# Patient Record
Sex: Female | Born: 1965 | Race: White | Hispanic: No | Smoking: Current every day smoker
Health system: Southern US, Community
[De-identification: ages and names within clinical notes are randomized; demographics above are authoritative.]

## PROBLEM LIST (undated history)

## (undated) DIAGNOSIS — D509 Iron deficiency anemia, unspecified: Secondary | ICD-10-CM

## (undated) DIAGNOSIS — T7840XA Allergy, unspecified, initial encounter: Secondary | ICD-10-CM

## (undated) DIAGNOSIS — Q282 Arteriovenous malformation of cerebral vessels: Secondary | ICD-10-CM

## (undated) DIAGNOSIS — R202 Paresthesia of skin: Secondary | ICD-10-CM

## (undated) DIAGNOSIS — G40909 Epilepsy, unspecified, not intractable, without status epilepticus: Secondary | ICD-10-CM

## (undated) DIAGNOSIS — G43909 Migraine, unspecified, not intractable, without status migrainosus: Secondary | ICD-10-CM

## (undated) DIAGNOSIS — R200 Anesthesia of skin: Secondary | ICD-10-CM

## (undated) DIAGNOSIS — G510 Bell's palsy: Secondary | ICD-10-CM

## (undated) DIAGNOSIS — M797 Fibromyalgia: Secondary | ICD-10-CM

## (undated) DIAGNOSIS — M5430 Sciatica, unspecified side: Secondary | ICD-10-CM

## (undated) DIAGNOSIS — R413 Other amnesia: Secondary | ICD-10-CM

## (undated) DIAGNOSIS — E041 Nontoxic single thyroid nodule: Secondary | ICD-10-CM

## (undated) DIAGNOSIS — Z780 Asymptomatic menopausal state: Secondary | ICD-10-CM

## (undated) HISTORY — DX: Fibromyalgia: M79.7

## (undated) HISTORY — DX: Asymptomatic menopausal state: Z78.0

## (undated) HISTORY — DX: Sciatica, unspecified side: M54.30

## (undated) HISTORY — DX: Anesthesia of skin: R20.0

## (undated) HISTORY — DX: Migraine, unspecified, not intractable, without status migrainosus: G43.909

## (undated) HISTORY — DX: Arteriovenous malformation of cerebral vessels: Q28.2

## (undated) HISTORY — DX: Iron deficiency anemia, unspecified: D50.9

## (undated) HISTORY — DX: Anesthesia of skin: R20.2

## (undated) HISTORY — DX: Nontoxic single thyroid nodule: E04.1

## (undated) HISTORY — DX: Bell's palsy: G51.0

## (undated) HISTORY — DX: Allergy, unspecified, initial encounter: T78.40XA

---

## 1996-01-25 HISTORY — PX: TUBAL LIGATION: SHX77

## 2002-01-24 HISTORY — PX: ABDOMINAL HYSTERECTOMY: SHX81

## 2003-01-25 HISTORY — PX: CRANIOTOMY: SHX93

## 2003-07-09 DIAGNOSIS — Q282 Arteriovenous malformation of cerebral vessels: Secondary | ICD-10-CM

## 2003-07-09 HISTORY — DX: Arteriovenous malformation of cerebral vessels: Q28.2

## 2003-08-13 ENCOUNTER — Ambulatory Visit (HOSPITAL_COMMUNITY): Admission: RE | Admit: 2003-08-13 | Discharge: 2003-08-13 | Payer: Self-pay | Admitting: Neurological Surgery

## 2004-01-05 ENCOUNTER — Ambulatory Visit: Payer: Self-pay | Admitting: Family Medicine

## 2004-01-08 ENCOUNTER — Ambulatory Visit: Payer: Self-pay | Admitting: Family Medicine

## 2004-10-05 ENCOUNTER — Ambulatory Visit: Payer: Self-pay | Admitting: Family Medicine

## 2005-02-03 ENCOUNTER — Encounter: Payer: Self-pay | Admitting: Family Medicine

## 2005-02-24 ENCOUNTER — Encounter: Payer: Self-pay | Admitting: Family Medicine

## 2005-03-24 ENCOUNTER — Encounter: Payer: Self-pay | Admitting: Family Medicine

## 2005-04-24 ENCOUNTER — Encounter: Payer: Self-pay | Admitting: Family Medicine

## 2005-12-08 ENCOUNTER — Ambulatory Visit: Payer: Self-pay | Admitting: Family Medicine

## 2006-03-19 ENCOUNTER — Emergency Department: Payer: Self-pay | Admitting: General Practice

## 2006-12-06 ENCOUNTER — Ambulatory Visit: Payer: Self-pay | Admitting: Family Medicine

## 2007-01-30 ENCOUNTER — Ambulatory Visit: Payer: Self-pay | Admitting: Family Medicine

## 2007-09-13 ENCOUNTER — Ambulatory Visit: Payer: Self-pay | Admitting: Family Medicine

## 2008-09-02 DIAGNOSIS — D509 Iron deficiency anemia, unspecified: Secondary | ICD-10-CM

## 2008-09-02 DIAGNOSIS — F321 Major depressive disorder, single episode, moderate: Secondary | ICD-10-CM | POA: Insufficient documentation

## 2008-09-02 HISTORY — DX: Iron deficiency anemia, unspecified: D50.9

## 2008-10-03 ENCOUNTER — Ambulatory Visit: Payer: Self-pay | Admitting: Family Medicine

## 2010-10-27 ENCOUNTER — Ambulatory Visit: Payer: Self-pay | Admitting: Family Medicine

## 2010-10-27 DIAGNOSIS — E041 Nontoxic single thyroid nodule: Secondary | ICD-10-CM

## 2010-10-27 HISTORY — DX: Nontoxic single thyroid nodule: E04.1

## 2011-01-28 DIAGNOSIS — IMO0001 Reserved for inherently not codable concepts without codable children: Secondary | ICD-10-CM | POA: Diagnosis not present

## 2011-01-28 DIAGNOSIS — Z23 Encounter for immunization: Secondary | ICD-10-CM | POA: Diagnosis not present

## 2011-03-22 DIAGNOSIS — F39 Unspecified mood [affective] disorder: Secondary | ICD-10-CM | POA: Diagnosis not present

## 2011-05-25 DIAGNOSIS — Z23 Encounter for immunization: Secondary | ICD-10-CM | POA: Diagnosis not present

## 2011-05-25 DIAGNOSIS — F329 Major depressive disorder, single episode, unspecified: Secondary | ICD-10-CM | POA: Diagnosis not present

## 2011-05-25 DIAGNOSIS — Z13220 Encounter for screening for lipoid disorders: Secondary | ICD-10-CM | POA: Diagnosis not present

## 2011-08-23 ENCOUNTER — Ambulatory Visit: Payer: Self-pay | Admitting: Family Medicine

## 2011-08-23 DIAGNOSIS — Z23 Encounter for immunization: Secondary | ICD-10-CM | POA: Diagnosis not present

## 2011-08-23 DIAGNOSIS — S6990XA Unspecified injury of unspecified wrist, hand and finger(s), initial encounter: Secondary | ICD-10-CM | POA: Diagnosis not present

## 2011-08-23 DIAGNOSIS — M79609 Pain in unspecified limb: Secondary | ICD-10-CM | POA: Diagnosis not present

## 2011-08-23 DIAGNOSIS — M25539 Pain in unspecified wrist: Secondary | ICD-10-CM | POA: Diagnosis not present

## 2011-08-23 DIAGNOSIS — M25529 Pain in unspecified elbow: Secondary | ICD-10-CM | POA: Diagnosis not present

## 2011-08-23 DIAGNOSIS — S59919A Unspecified injury of unspecified forearm, initial encounter: Secondary | ICD-10-CM | POA: Diagnosis not present

## 2011-08-23 DIAGNOSIS — S59909A Unspecified injury of unspecified elbow, initial encounter: Secondary | ICD-10-CM | POA: Diagnosis not present

## 2011-08-23 DIAGNOSIS — F329 Major depressive disorder, single episode, unspecified: Secondary | ICD-10-CM | POA: Diagnosis not present

## 2011-09-06 DIAGNOSIS — F329 Major depressive disorder, single episode, unspecified: Secondary | ICD-10-CM | POA: Diagnosis not present

## 2011-09-06 DIAGNOSIS — J0100 Acute maxillary sinusitis, unspecified: Secondary | ICD-10-CM | POA: Diagnosis not present

## 2011-09-06 DIAGNOSIS — Z23 Encounter for immunization: Secondary | ICD-10-CM | POA: Diagnosis not present

## 2011-10-31 DIAGNOSIS — F39 Unspecified mood [affective] disorder: Secondary | ICD-10-CM | POA: Diagnosis not present

## 2011-11-16 DIAGNOSIS — Z23 Encounter for immunization: Secondary | ICD-10-CM | POA: Diagnosis not present

## 2011-11-16 DIAGNOSIS — IMO0001 Reserved for inherently not codable concepts without codable children: Secondary | ICD-10-CM | POA: Diagnosis not present

## 2011-11-16 DIAGNOSIS — F329 Major depressive disorder, single episode, unspecified: Secondary | ICD-10-CM | POA: Diagnosis not present

## 2012-03-13 DIAGNOSIS — F329 Major depressive disorder, single episode, unspecified: Secondary | ICD-10-CM | POA: Diagnosis not present

## 2012-03-13 DIAGNOSIS — R42 Dizziness and giddiness: Secondary | ICD-10-CM | POA: Diagnosis not present

## 2012-03-13 DIAGNOSIS — Z23 Encounter for immunization: Secondary | ICD-10-CM | POA: Diagnosis not present

## 2012-04-30 DIAGNOSIS — F39 Unspecified mood [affective] disorder: Secondary | ICD-10-CM | POA: Diagnosis not present

## 2012-09-18 ENCOUNTER — Observation Stay: Payer: Self-pay | Admitting: Internal Medicine

## 2012-09-18 DIAGNOSIS — Z6841 Body Mass Index (BMI) 40.0 and over, adult: Secondary | ICD-10-CM | POA: Diagnosis not present

## 2012-09-18 DIAGNOSIS — R072 Precordial pain: Secondary | ICD-10-CM | POA: Diagnosis not present

## 2012-09-18 DIAGNOSIS — R52 Pain, unspecified: Secondary | ICD-10-CM | POA: Diagnosis not present

## 2012-09-18 DIAGNOSIS — Z79899 Other long term (current) drug therapy: Secondary | ICD-10-CM | POA: Diagnosis not present

## 2012-09-18 DIAGNOSIS — I209 Angina pectoris, unspecified: Secondary | ICD-10-CM | POA: Diagnosis not present

## 2012-09-18 DIAGNOSIS — E669 Obesity, unspecified: Secondary | ICD-10-CM | POA: Diagnosis not present

## 2012-09-18 DIAGNOSIS — G40909 Epilepsy, unspecified, not intractable, without status epilepticus: Secondary | ICD-10-CM | POA: Diagnosis not present

## 2012-09-18 DIAGNOSIS — R0602 Shortness of breath: Secondary | ICD-10-CM | POA: Diagnosis not present

## 2012-09-18 DIAGNOSIS — Z8541 Personal history of malignant neoplasm of cervix uteri: Secondary | ICD-10-CM | POA: Diagnosis not present

## 2012-09-18 DIAGNOSIS — R079 Chest pain, unspecified: Secondary | ICD-10-CM | POA: Diagnosis not present

## 2012-09-18 LAB — CBC
HCT: 36.9 % (ref 35.0–47.0)
HGB: 12.9 g/dL (ref 12.0–16.0)
MCH: 31 pg (ref 26.0–34.0)
MCHC: 34.9 g/dL (ref 32.0–36.0)
Platelet: 275 10*3/uL (ref 150–440)

## 2012-09-18 LAB — LIPID PANEL
CHOLESTEROL: 190 mg/dL (ref 0–200)
Cholesterol: 190 mg/dL
HDL Cholesterol: 61 mg/dL — ABNORMAL HIGH
HDL: 61 mg/dL (ref 35–70)
LDL CALC: 97 mg/dL
Ldl Cholesterol, Calc: 97 mg/dL
TRIGLYCERIDES: 160 mg/dL (ref 40–160)
Triglycerides: 160 mg/dL
VLDL Cholesterol, Calc: 32 mg/dL

## 2012-09-18 LAB — BASIC METABOLIC PANEL
Calcium, Total: 9.5 mg/dL (ref 8.5–10.1)
Chloride: 104 mmol/L (ref 98–107)
Co2: 25 mmol/L (ref 21–32)
EGFR (African American): >60
Sodium: 137 mmol/L (ref 136–145)

## 2012-09-18 LAB — TROPONIN I: Troponin-I: <0.02 ng/mL

## 2012-09-18 LAB — CK TOTAL AND CKMB (NOT AT ARMC): CK-MB: 1.2 ng/mL (ref 0.5–3.6)

## 2012-09-19 DIAGNOSIS — E669 Obesity, unspecified: Secondary | ICD-10-CM | POA: Diagnosis not present

## 2012-09-19 DIAGNOSIS — R079 Chest pain, unspecified: Secondary | ICD-10-CM | POA: Diagnosis not present

## 2012-09-19 DIAGNOSIS — R0609 Other forms of dyspnea: Secondary | ICD-10-CM | POA: Diagnosis not present

## 2012-09-19 DIAGNOSIS — I209 Angina pectoris, unspecified: Secondary | ICD-10-CM | POA: Diagnosis not present

## 2012-09-19 DIAGNOSIS — G40909 Epilepsy, unspecified, not intractable, without status epilepticus: Secondary | ICD-10-CM | POA: Diagnosis not present

## 2012-09-19 LAB — CK TOTAL AND CKMB (NOT AT ARMC)
CK, Total: 154 U/L (ref 21–215)
CK-MB: 1.2 ng/mL (ref 0.5–3.6)

## 2012-09-19 LAB — TROPONIN I: Troponin-I: <0.02 ng/mL

## 2012-09-21 DIAGNOSIS — R079 Chest pain, unspecified: Secondary | ICD-10-CM | POA: Diagnosis not present

## 2012-09-21 DIAGNOSIS — Z23 Encounter for immunization: Secondary | ICD-10-CM | POA: Diagnosis not present

## 2012-09-21 DIAGNOSIS — K219 Gastro-esophageal reflux disease without esophagitis: Secondary | ICD-10-CM | POA: Diagnosis not present

## 2012-10-16 DIAGNOSIS — K219 Gastro-esophageal reflux disease without esophagitis: Secondary | ICD-10-CM | POA: Diagnosis not present

## 2012-10-16 DIAGNOSIS — Z23 Encounter for immunization: Secondary | ICD-10-CM | POA: Diagnosis not present

## 2012-10-16 DIAGNOSIS — R1310 Dysphagia, unspecified: Secondary | ICD-10-CM | POA: Diagnosis not present

## 2012-10-24 ENCOUNTER — Ambulatory Visit: Payer: Self-pay | Admitting: Family Medicine

## 2012-10-24 DIAGNOSIS — R1310 Dysphagia, unspecified: Secondary | ICD-10-CM | POA: Diagnosis not present

## 2012-10-29 DIAGNOSIS — Z79899 Other long term (current) drug therapy: Secondary | ICD-10-CM | POA: Diagnosis not present

## 2012-10-29 DIAGNOSIS — F39 Unspecified mood [affective] disorder: Secondary | ICD-10-CM | POA: Diagnosis not present

## 2012-10-29 DIAGNOSIS — Z1339 Encounter for screening examination for other mental health and behavioral disorders: Secondary | ICD-10-CM | POA: Diagnosis not present

## 2012-12-18 DIAGNOSIS — R1310 Dysphagia, unspecified: Secondary | ICD-10-CM | POA: Diagnosis not present

## 2012-12-18 DIAGNOSIS — R1032 Left lower quadrant pain: Secondary | ICD-10-CM | POA: Diagnosis not present

## 2012-12-18 DIAGNOSIS — R1031 Right lower quadrant pain: Secondary | ICD-10-CM | POA: Diagnosis not present

## 2013-01-24 DIAGNOSIS — Z780 Asymptomatic menopausal state: Secondary | ICD-10-CM

## 2013-01-24 HISTORY — DX: Asymptomatic menopausal state: Z78.0

## 2013-02-20 DIAGNOSIS — R209 Unspecified disturbances of skin sensation: Secondary | ICD-10-CM | POA: Diagnosis not present

## 2013-02-20 DIAGNOSIS — Z23 Encounter for immunization: Secondary | ICD-10-CM | POA: Diagnosis not present

## 2013-02-20 DIAGNOSIS — G40909 Epilepsy, unspecified, not intractable, without status epilepticus: Secondary | ICD-10-CM | POA: Diagnosis not present

## 2013-02-20 LAB — CBC AND DIFFERENTIAL
HCT: 37 % (ref 36–46)
Hemoglobin: 12.6 g/dL (ref 12.0–16.0)
Platelets: 314 10*3/uL (ref 150–399)
WBC: 7.6 10*3/mL

## 2013-02-21 ENCOUNTER — Ambulatory Visit: Payer: Self-pay | Admitting: Family Medicine

## 2013-02-21 DIAGNOSIS — R209 Unspecified disturbances of skin sensation: Secondary | ICD-10-CM | POA: Diagnosis not present

## 2013-02-21 DIAGNOSIS — G9389 Other specified disorders of brain: Secondary | ICD-10-CM | POA: Diagnosis not present

## 2013-02-21 DIAGNOSIS — R51 Headache: Secondary | ICD-10-CM | POA: Diagnosis not present

## 2013-02-21 DIAGNOSIS — H538 Other visual disturbances: Secondary | ICD-10-CM | POA: Diagnosis not present

## 2013-03-15 DIAGNOSIS — Z23 Encounter for immunization: Secondary | ICD-10-CM | POA: Diagnosis not present

## 2013-03-15 DIAGNOSIS — G40909 Epilepsy, unspecified, not intractable, without status epilepticus: Secondary | ICD-10-CM | POA: Diagnosis not present

## 2013-03-15 DIAGNOSIS — R42 Dizziness and giddiness: Secondary | ICD-10-CM | POA: Diagnosis not present

## 2013-03-15 DIAGNOSIS — J019 Acute sinusitis, unspecified: Secondary | ICD-10-CM | POA: Diagnosis not present

## 2013-03-15 DIAGNOSIS — E041 Nontoxic single thyroid nodule: Secondary | ICD-10-CM | POA: Diagnosis not present

## 2013-03-15 LAB — TSH: TSH: 1.68 u[IU]/mL (ref <=5.90)

## 2013-03-15 LAB — HEPATIC FUNCTION PANEL
ALT: 18 U/L (ref 7–35)
AST: 14 U/L (ref 13–35)

## 2013-03-15 LAB — BASIC METABOLIC PANEL
BUN: 18 mg/dL (ref 4–21)
CREATININE: 0.7 mg/dL (ref <=1.1)
GLUCOSE: 87 mg/dL
Potassium: 4.4 mmol/L (ref 3.4–5.3)
Sodium: 141 mmol/L (ref 137–147)

## 2013-03-18 DIAGNOSIS — F39 Unspecified mood [affective] disorder: Secondary | ICD-10-CM | POA: Diagnosis not present

## 2013-04-17 DIAGNOSIS — G40909 Epilepsy, unspecified, not intractable, without status epilepticus: Secondary | ICD-10-CM | POA: Diagnosis not present

## 2013-04-17 DIAGNOSIS — H6500 Acute serous otitis media, unspecified ear: Secondary | ICD-10-CM | POA: Diagnosis not present

## 2013-04-17 DIAGNOSIS — Z23 Encounter for immunization: Secondary | ICD-10-CM | POA: Diagnosis not present

## 2013-04-17 DIAGNOSIS — J309 Allergic rhinitis, unspecified: Secondary | ICD-10-CM | POA: Diagnosis not present

## 2013-07-01 DIAGNOSIS — G40909 Epilepsy, unspecified, not intractable, without status epilepticus: Secondary | ICD-10-CM | POA: Diagnosis not present

## 2013-07-01 DIAGNOSIS — Z23 Encounter for immunization: Secondary | ICD-10-CM | POA: Diagnosis not present

## 2013-07-01 DIAGNOSIS — M25569 Pain in unspecified knee: Secondary | ICD-10-CM | POA: Diagnosis not present

## 2013-07-01 DIAGNOSIS — M25559 Pain in unspecified hip: Secondary | ICD-10-CM | POA: Diagnosis not present

## 2013-09-19 DIAGNOSIS — F3289 Other specified depressive episodes: Secondary | ICD-10-CM | POA: Diagnosis not present

## 2013-09-19 DIAGNOSIS — N951 Menopausal and female climacteric states: Secondary | ICD-10-CM | POA: Diagnosis not present

## 2013-09-19 DIAGNOSIS — IMO0001 Reserved for inherently not codable concepts without codable children: Secondary | ICD-10-CM | POA: Diagnosis not present

## 2013-09-19 DIAGNOSIS — F329 Major depressive disorder, single episode, unspecified: Secondary | ICD-10-CM | POA: Diagnosis not present

## 2013-09-19 DIAGNOSIS — G40909 Epilepsy, unspecified, not intractable, without status epilepticus: Secondary | ICD-10-CM | POA: Diagnosis not present

## 2014-01-01 DIAGNOSIS — F329 Major depressive disorder, single episode, unspecified: Secondary | ICD-10-CM | POA: Diagnosis not present

## 2014-01-01 DIAGNOSIS — J309 Allergic rhinitis, unspecified: Secondary | ICD-10-CM | POA: Diagnosis not present

## 2014-01-01 DIAGNOSIS — J069 Acute upper respiratory infection, unspecified: Secondary | ICD-10-CM | POA: Diagnosis not present

## 2014-01-01 DIAGNOSIS — M797 Fibromyalgia: Secondary | ICD-10-CM | POA: Diagnosis not present

## 2014-05-16 NOTE — H&P (Signed)
PATIENT NAME:  Sharon Bryan, Sharon Bryan MR#:  [id number] DATE OF BIRTH:  [birth year]  DATE OF ADMISSION:  09/18/2012  PRIMARY CARE PHYSICIAN:  Lelon Huh, M.D.   REFERRING PHYSICIAN:  Delene Loll, M.D.  CHIEF COMPLAINT: Chest pain.   HISTORY OF PRESENT ILLNESS:  The patient is a 49 year old morbidly obese female with a history of brain surgery for AVM, history of seizures, currently not on any seizure medications, presented to the Emergency Department with complaints of chest pain that started while at rest. The pain started in the left parasternal area, sharp in nature, radiating to the back, 10 x 10 intensity, associated with shortness of breath. Denied having any nausea, vomiting, diaphoresis, palpitations. The patient has been experiencing this pain on and off lasting about 10 to 15 minutes; however, less intense. The patient took aspirin after the initial event with improvement of the pain. The patient denies having any previous episodes of chest pain. Denies lifting any heavy weights, denies any recent travel, denies having any cough productive of sputum.   PAST MEDICAL HISTORY: 1.  History of seizures.  2.  Cervical cancer.  3.  AVMs of the brain.  4.  Brain surgery.  5.  Partial hysterectomy.   ALLERGIES: CODEINE.    HOME MEDICATIONS: 1.  Paroxetine 20 mg once a day.  2.  Lyrica 150 mg 2 times a day.  3.  Lamotrigine 200 mg once a day. 4.  Diclofenac 50 mg 2 times a day.  5.  30 mg 2 times a day.  6.  Amitriptyline 10 mg 1 to 3 tablets once a day.  7.  Alprazolam 1 tablet at bedtime.   SOCIAL HISTORY: No history of smoking, drinking alcohol or using illicit drugs. Lives with her fiance.   FAMILY HISTORY: Strong family history of coronary artery disease.   REVIEW OF SYSTEMS: CONSTITUTIONAL: No generalized weakness.   EYES: No change in vision.  ENT: No change in hearing. No sore throat. RESPIRATORY: Has mild shortness of breath secondary to chest pain.  CARDIOVASCULAR: No  palpitations. No pedal edema, has chest pain.  GASTROINTESTINAL: No nausea or vomiting.  GENITOURINARY: No dysuria or hematuria.  SKIN: No rash or lesions.  HEMATOLOGIC: No easy bruising or bleeding.   ENDOCRINE: No polyuria or polydipsia.  MUSCULOSKELETAL:  Has chronic back pain.  NEUROLOGIC: No weakness or numbness in any part of the body. History of seizures.   PHYSICAL EXAMINATION: GENERAL: This is a well-built, well-nourished, obese female, lying down in the bed, not in distress.  VITAL SIGNS: Temperature 97.8, pulse 76, blood pressure 132/92, respiratory rate of 16, oxygen saturation is 98% on room air.  HEENT: Head normocephalic, atraumatic. Eyes: No scleral icterus, conjunctivae normal. Pupils equal and react to light. Extraocular movements are intact. Mucous membranes moist. No pallor.  NECK: Supple. No lymphadenopathy. No JVD. No carotid bruit. No thyromegaly.  CHEST: Has palpable tenderness on the left parasternal area. LUNGS:  Bilaterally clear to auscultation.  HEART: S1,S2 regular. No murmurs are heard.  ABDOMEN: Bowel sounds present. Soft, nontender, nondistended, obese abdomen. Could not appreciate any hepatosplenomegaly.  EXTREMITIES: No pedal edema. Pulses 2+.  SKIN: No rash or lesions.  MUSCULOSKELETAL: No joint pains and aches. NEUROLOGIC: The patient is alert, oriented to place, person and time. Cranial nerves II through XII intact. No motor and sensory deficits.   EKG, 12-lead: T wave inversions in V2, V3 V4.   LABORATORY DATA: D-dimer is less than 0.22. Troponin is negative. CBC, BMP are completely  within normal limits.   Chest x-ray, one view portable: No acute cardiopulmonary disease.   ASSESSMENT AND PLAN: The patient is a 49 year old obese female who comes to the Emergency Department with complaints of chest pain.   1.  Chest pain:  This is a musculoskeletal pain; however, the patient's risk factors are obesity,  family history. Admit the patient to monitored  bed, cycle cardiac enzymes times x 3. If negative, the patient can have an exercise stress echo prior to discharge.  2.  Obesity. Counseled with the patient regarding diet and exercise.  3.  Keep the patient on deep vein thrombosis prophylaxis with Lovenox.   TIME SPENT: 40 minutes.     ____________________________ Monica Becton, MD pv:nts D: 09/19/2012 04:25:00 ET T: 09/19/2012 05:53:37 ET JOB#: [id number]  cc: Kirstie Peri. Caryn Section, MD Monica Becton, MD, <Dictator>   Grier Mitts Tomasz Steeves MD ELECTRONICALLY SIGNED 09/26/2012 22:08

## 2014-05-16 NOTE — Discharge Summary (Signed)
PATIENT NAME:  Sharon Bryan, Sharon Bryan MR#:  [id number] DATE OF BIRTH:  August 27, 1965  DATE OF ADMISSION:  09/18/2012  DATE OF DISCHARGE:  09/19/2012  ADMITTING DIAGNOSIS:  Chest pain.   DISCHARGE DIAGNOSES:  1.  Chest pain of unclear etiology at this time, suspected musculoskeletal, with negative cardiac enzymes for injury, as well as negative stress Myoview testing. Rule out esophageal spasm and gastroesophageal reflux disease.  2.  Obesity.  3.  History of seizures. Brain AVM, status post surgery. 4.  History of cervical cancer. 5.  History of partial hysterectomy.   DISCHARGE CONDITION:  Stable.   DISCHARGE MEDICATIONS: The patient is to resume her outpatient medications, which are 1.  Alprazolam 1 mg p.o. at bedtime. 2.  Amitriptyline 1 to 3 tablets p.o. at bedtime.  3.  Buspirone 30 mg p.o. twice daily.  4.  Paroxetine 10 mg p.o. 1 daily. 5.  Lyrica 150 mg p.o. twice daily.  6.  Lamotrigine 200 mg p.o. once daily.  7.  Diclofenac sodium 50 mg p.o. twice daily.  8.  Acetaminophen 325 mg 2 tablets every 4 hours as needed.  9. Omeprazole 20 mg p.o. once daily. Tylenol as well as omeprazole are new medications.  DISCHARGE INSTRUCTIONS:  Home oxygen:  None.   Diet:  2 gram salt, low fat, low cholesterol, low calorie diet.   The patient was advised to keep her head of bed up by 30 degrees at night.   Diet consistency:  Regular.   Activity limitations as tolerated.   Follow-up appointment with Dr. Caryn Section in 2 days after discharge.    CONSULTANTS:  Care Management.   RADIOLOGIC STUDIES: Chest x-ray, portable, single view on 09/18/2012 showed no evidence of acute cardiopulmonary abnormality. Nuclear medicine testing and Myoview stress test in 08/30/2012 revealed exercise myocardial perfusion study with no significant ischemia. No significant wall motion abnormality noted. Overall, was noted to be low risk scan. Estimated ejection fraction was 72%. Left ventricular global function  was normal. No EKG changes concerning for ischemia, and no artifact noted on this study, read by Dr. Nehemiah Massed.  HISTORY:  The patient is a 49 year old Caucasian female with past medical history significant for history of seizures, cervical cancer, AVM on the brain, status post brain surgery in the past, who presents to the hospital with complaints of chest pains. Please refer to Dr. Ward Givens admission note on 09/18/2012. The patient stated her pain was in the left parasternal area, sharp in nature, radiating to the back, associated with shortness of breath. There are no other symptoms such as nausea, vomiting, diaphoresis or palpitations. It lasted approximately 10 to 15 minutes. However, it was very intense initially. She took aspirin, with some improvement of her pain. She had no pain in the past. Her vital signs were fine on admission to the hospital, and O2 sats were good at 98% on room air. Physical examination was unremarkable.  EKG showed T-wave inversions in V2 through V4. D-dimer was less than 0.25. Her labs were completely within normal limits, including cardiac enzymes first set as well as subsequent 2 more sets.   HOSPITAL COURSE: The patient was admitted to the hospital for further evaluation. Her cardiac enzymes were cycled, and she underwent Myoview stress test in the morning of 09/19/2012, which was negative for inducible ischemia. Upon re-evaluation of the patient, it appeared that she was having (Dictation Anomaly) chest wall  pain which was reproducible by pressure on the chest. It was felt that patient's pain could have  been related to musculoskeletal problems versus gastroesophageal reflux disease, since patient is on nonsteroidal anti-inflammatory medications. The patient was advised to have her head elevated at bedtime, and she was initiated on Tylenol as needed as well as omeprazole. She is to follow up with her primary care physician for further recommendations. She was ambulated in  the hospital and had no recurrence of her chest pains, and her O2 sats stayed normal at 97% on room air on exertion. In regards to her history of seizures and her chronic medical problems, she is to continue her outpatient management. No further interventions were made. The patient is being discharged in stable condition with the above-mentioned medications and followup.   Her vital signs on the day of discharge:  Temperature was 97.8, pulse was 79, respirations were 18, blood pressure 118/73, saturation was 97% on room air on exertion. Of note, patient's risk factors were evaluated for possible coronary disease, and her LDL was found to be 97, triglycerides were 160, cholesterol level of 190, and HDL was 61.     ____________________________ Theodoro Grist, MD rv:mr D: 09/19/2012 14:30:00 ET T: 09/19/2012 19:08:16 ET JOB#: [id number]  cc: Kirstie Peri. Caryn Section, MD Theodoro Grist, MD, <Dictator>    Pryce Folts Ether Griffins MD ELECTRONICALLY SIGNED 10/13/2012 18:10

## 2014-06-28 ENCOUNTER — Other Ambulatory Visit: Payer: Self-pay | Admitting: Family Medicine

## 2014-07-08 ENCOUNTER — Other Ambulatory Visit: Payer: Self-pay | Admitting: Family Medicine

## 2014-07-17 ENCOUNTER — Other Ambulatory Visit: Payer: Self-pay | Admitting: Family Medicine

## 2014-07-17 DIAGNOSIS — M797 Fibromyalgia: Secondary | ICD-10-CM

## 2014-07-18 ENCOUNTER — Other Ambulatory Visit: Payer: Self-pay | Admitting: Family Medicine

## 2014-07-18 DIAGNOSIS — J309 Allergic rhinitis, unspecified: Secondary | ICD-10-CM

## 2014-07-18 DIAGNOSIS — M797 Fibromyalgia: Secondary | ICD-10-CM | POA: Insufficient documentation

## 2014-07-18 NOTE — Telephone Encounter (Signed)
Pt's husband Laurey Arrow request we refill Lyrica 225 mg to [facility]. I advised there should be more refills because it was sent 01/01/14 with 11 refills. Laurey Arrow stated he spoke with [facility] and was told no more refills and RX would need to be renewed. Pt's last OV was 01/01/14. Thanks TNP

## 2014-07-18 NOTE — Telephone Encounter (Signed)
This rx was sent into pharmacy today.

## 2014-07-30 ENCOUNTER — Other Ambulatory Visit: Payer: Self-pay | Admitting: Family Medicine

## 2014-08-04 DIAGNOSIS — I889 Nonspecific lymphadenitis, unspecified: Secondary | ICD-10-CM | POA: Insufficient documentation

## 2014-08-04 DIAGNOSIS — E049 Nontoxic goiter, unspecified: Secondary | ICD-10-CM | POA: Insufficient documentation

## 2014-08-04 DIAGNOSIS — R200 Anesthesia of skin: Secondary | ICD-10-CM | POA: Insufficient documentation

## 2014-08-04 DIAGNOSIS — K219 Gastro-esophageal reflux disease without esophagitis: Secondary | ICD-10-CM | POA: Insufficient documentation

## 2014-08-04 DIAGNOSIS — R232 Flushing: Secondary | ICD-10-CM | POA: Insufficient documentation

## 2014-08-04 DIAGNOSIS — G510 Bell's palsy: Secondary | ICD-10-CM | POA: Insufficient documentation

## 2014-08-04 DIAGNOSIS — E669 Obesity, unspecified: Secondary | ICD-10-CM

## 2014-08-04 DIAGNOSIS — Z13220 Encounter for screening for lipoid disorders: Secondary | ICD-10-CM | POA: Insufficient documentation

## 2014-08-04 DIAGNOSIS — R42 Dizziness and giddiness: Secondary | ICD-10-CM | POA: Insufficient documentation

## 2014-08-04 DIAGNOSIS — R202 Paresthesia of skin: Secondary | ICD-10-CM

## 2014-08-04 DIAGNOSIS — M5430 Sciatica, unspecified side: Secondary | ICD-10-CM | POA: Insufficient documentation

## 2014-08-04 DIAGNOSIS — R4702 Dysphasia: Secondary | ICD-10-CM | POA: Insufficient documentation

## 2014-08-04 DIAGNOSIS — M25559 Pain in unspecified hip: Secondary | ICD-10-CM | POA: Insufficient documentation

## 2014-08-04 DIAGNOSIS — G43909 Migraine, unspecified, not intractable, without status migrainosus: Secondary | ICD-10-CM | POA: Insufficient documentation

## 2014-08-04 DIAGNOSIS — R300 Dysuria: Secondary | ICD-10-CM | POA: Insufficient documentation

## 2014-08-04 DIAGNOSIS — Z780 Asymptomatic menopausal state: Secondary | ICD-10-CM | POA: Insufficient documentation

## 2014-08-04 HISTORY — DX: Sciatica, unspecified side: M54.30

## 2014-08-07 ENCOUNTER — Encounter: Payer: Self-pay | Admitting: Family Medicine

## 2014-08-07 ENCOUNTER — Ambulatory Visit (INDEPENDENT_AMBULATORY_CARE_PROVIDER_SITE_OTHER): Payer: Medicare Other | Admitting: Family Medicine

## 2014-08-07 VITALS — BP 94/60 | HR 66 | Temp 98.7°F | Resp 16 | Ht 62.0 in | Wt 260.0 lb

## 2014-08-07 DIAGNOSIS — G40909 Epilepsy, unspecified, not intractable, without status epilepticus: Secondary | ICD-10-CM

## 2014-08-07 DIAGNOSIS — R42 Dizziness and giddiness: Secondary | ICD-10-CM

## 2014-08-07 DIAGNOSIS — E041 Nontoxic single thyroid nodule: Secondary | ICD-10-CM | POA: Diagnosis not present

## 2014-08-07 NOTE — Progress Notes (Signed)
Patient: Sharon Bryan Female    DOB: 1965/09/07   49 y.o.   MRN: [id number] Visit Date: 08/07/2014  Today's Provider: Lelon Huh, MD   Chief Complaint  Patient presents with  . Follow-up    Medications  . Weight Gain   Subjective:    HPI   Patient has been sleeping a lot since Tuesday and not felt right since Saturday. She states she had been in her usual state of health on Saturday when she just felt weird all day. She has been sleepy and tired, and is resting well at night. She had episode 2 days ago when she became dizzy, light headed and got sick. Other than that she has had no stomach pains or cramping, no diarrhea. Has had no recent changes in her medications and is taking no OTC medications.   Allergies  Allergen Reactions  . Codeine     increased heart rate ;choking feeling   Previous Medications   ACETAMINOPHEN (TYLENOL) 325 MG TABLET    Take 1 tablet by mouth every 4 (four) hours as needed.   ALPRAZOLAM (XANAX) 1 MG TABLET    Take 1 tablet by mouth every 8 (eight) hours as needed.   AMITRIPTYLINE (ELAVIL) 10 MG TABLET    Take by mouth. 1 - 3 oral at bedtime.   BUSPIRONE (BUSPAR) 30 MG TABLET    TAKE 1 TABLET BY MOUTH TWICE DAILY   ESTRADIOL (ESTRACE) 1 MG TABLET    TAKE 1 TABLET BY MOUTH EVERY DAY   FLUTICASONE (FLONASE) 50 MCG/ACT NASAL SPRAY    USE 2 SPRAYS IN EACH NOSTRIL EVERY DAY   LAMOTRIGINE (LAMICTAL) 150 MG TABLET    Take 1 tablet by mouth 2 (two) times daily.   LYRICA 225 MG CAPSULE    TAKE 1 CAPSULE BY MOUTH TWICE DAILY   MELOXICAM (MOBIC) 15 MG TABLET    Take 1 tablet by mouth daily.   NEOMYCIN-POLYMYXIN-HYDROCORTISONE (CORTISPORIN) 3.5-10000-1 OTIC SUSPENSION    Place 4 drops in ear(s) 4 (four) times daily.   OMEPRAZOLE (PRILOSEC) 20 MG CAPSULE    TAKE 1 CAPSULE BY MOUTH ONCE DAILY   PAROXETINE (PAXIL) 20 MG TABLET    Take 1 tablet by mouth daily.   PHENYTOIN (DILANTIN) 100 MG ER CAPSULE    Take 1 capsule by mouth daily.    Review of  Systems  Constitutional: Positive for fatigue. Negative for fever, chills and diaphoresis.  HENT: Negative for congestion, ear pain and facial swelling.   Respiratory: Negative for cough, chest tightness, shortness of breath and wheezing.   Cardiovascular: Negative for chest pain, palpitations and leg swelling.  Gastrointestinal: Positive for nausea and constipation. Negative for abdominal pain and diarrhea.  Neurological: Positive for light-headedness and headaches. Negative for tremors, syncope, weakness and numbness.  Psychiatric/Behavioral: Negative for behavioral problems and confusion.   Patient Active Problem List   Diagnosis Date Noted  . Dizziness 08/04/2014  . Dysphasia 08/04/2014  . Difficult or painful urination 08/04/2014  . GERD (gastroesophageal reflux disease) 08/04/2014  . Goiter 08/04/2014  . Arthralgia of hip 08/04/2014  . Hot flashes 08/04/2014  . Menopause 08/04/2014  . Migraine 08/04/2014  . Numbness and tingling of right arm and leg 08/04/2014  . Obesity 08/04/2014  . Sciatica 08/04/2014  . Fibromyalgia 07/18/2014  . Allergic rhinitis 07/18/2014  . Thyroid cyst 10/27/2010  . Malaise and fatigue 12/17/2008  . Anemia, iron deficiency 09/02/2008  . Anxiety disorder 09/02/2008  . Depressive  disorder 09/02/2008  . Seizure disorder 07/08/2008  . Epilepsy 11/10/2003  . AVM (arteriovenous malformation) brain 07/09/2003   Past Medical History  Diagnosis Date  . Thyroid cyst 10/27/2010  . 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 Bell's palsy   . Anemia, iron deficiency 09/02/2008  . Migraine   . Allergy   . Fibromyalgia     (worsening)  . Numbness and tingling of right arm and leg   . Menopause 2015    History  Substance Use Topics  . Smoking status: Former Smoker    Quit date: 01/25/2007  . Smokeless tobacco: Not on file     Comment: Smoked for about 15 years, smoked about 1 pack a day.  . Alcohol Use: No   Objective:   BP 94/60 mmHg   Pulse 66  Temp(Src) 98.7 F (37.1 C) (Oral)  Resp 16  Ht 5\' 2"  (1.575 m)  Wt 260 lb (117.935 kg)  BMI 47.54 kg/m2  SpO2 98%  Physical Exam  Wt Readings from Last 3 Encounters:  08/07/14 260 lb (117.935 kg)  01/01/14 262 lb (118.842 kg)   General Appearance:    Alert, cooperative, no distress, obese  Eyes:    PERRL, conjunctiva/corneas clear, EOM's intact       Lungs:     Clear to auscultation bilaterally, respirations unlabored  Heart:    Regular rate and rhythm  Neurologic:   Awake, alert, oriented x 3. No apparent focal neurological           defect. Normal finger to nose, normal tandem gait. Negative rhomberg.            Assessment & Plan:      1. Dizziness Normal exam, a little better today. Push clear liquids while awaiting labs.  - Comprehensive metabolic panel - CBC - T4 AND TSH - Lamotrigine level - Phenytoin level, total and free  2. Seizure disorder She states she feels like episodes of kind of like previous petit mal seizures. See how labs look. Consider sending back to neurology if not improving and labs normal.         Lelon Huh, MD  [place]

## 2014-08-08 LAB — COMPREHENSIVE METABOLIC PANEL
A/G RATIO: 1.4 (ref 1.1–2.5)
ALT: 15 IU/L (ref 0–32)
AST: 14 IU/L (ref 0–40)
Albumin: 4 g/dL (ref 3.5–5.5)
Alkaline Phosphatase: 57 IU/L (ref 39–117)
BUN/Creatinine Ratio: 19 (ref 9–23)
BUN: 14 mg/dL (ref 6–24)
Bilirubin Total: 0.2 mg/dL (ref 0.0–1.2)
CALCIUM: 9.1 mg/dL (ref 8.7–10.2)
CO2: 24 mmol/L (ref 18–29)
Chloride: 102 mmol/L (ref 97–108)
Creatinine, Ser: 0.74 mg/dL (ref 0.57–1.00)
GFR calc Af Amer: 112 mL/min/{1.73_m2} (ref >59)
GFR, EST NON AFRICAN AMERICAN: 97 mL/min/{1.73_m2} (ref >59)
GLOBULIN, TOTAL: 2.8 g/dL (ref 1.5–4.5)
GLUCOSE: 94 mg/dL (ref 65–99)
Potassium: 4.3 mmol/L (ref 3.5–5.2)
SODIUM: 142 mmol/L (ref 134–144)
Total Protein: 6.8 g/dL (ref 6.0–8.5)

## 2014-08-08 LAB — CBC
Hematocrit: 35.1 % (ref 34.0–46.6)
Hemoglobin: 12.1 g/dL (ref 11.1–15.9)
MCH: 30.6 pg (ref 26.6–33.0)
MCHC: 34.5 g/dL (ref 31.5–35.7)
MCV: 89 fL (ref 79–97)
Platelets: 264 10*3/uL (ref 150–379)
RBC: 3.96 x10E6/uL (ref 3.77–5.28)
RDW: 13.5 % (ref 12.3–15.4)
WBC: 6.5 10*3/uL (ref 3.4–10.8)

## 2014-08-08 LAB — T4 AND TSH
T4, Total: 8.1 ug/dL (ref 4.5–12.0)
TSH: 3.46 u[IU]/mL (ref 0.450–4.500)

## 2014-08-09 LAB — PHENYTOIN LEVEL, FREE: PHENYTOIN FREE: NOT DETECTED ug/mL (ref 1.0–2.0)

## 2014-08-11 LAB — LAMOTRIGINE LEVEL: Lamotrigine Lvl: 1.7 ug/mL — ABNORMAL LOW (ref 2.0–20.0)

## 2014-08-12 ENCOUNTER — Telehealth: Payer: Self-pay | Admitting: Family Medicine

## 2014-08-12 NOTE — Telephone Encounter (Signed)
Wants to know lab results from last Thursday.  Pt is going out of town end of week and would like a call back before then  [phone]  Thanks, Con Memos

## 2014-08-12 NOTE — Telephone Encounter (Signed)
Please review labs? Thanks! 

## 2014-08-13 ENCOUNTER — Telehealth: Payer: Self-pay | Admitting: Family Medicine

## 2014-08-13 MED ORDER — PHENYTOIN SODIUM EXTENDED 100 MG PO CAPS: 100.0000 mg | ORAL_CAPSULE | Freq: Every day | ORAL | Status: DC

## 2014-08-13 NOTE — Telephone Encounter (Signed)
Has question about her seizure medication.   Wanting to know if you need to up the dosage.   She is going out of town tomorrow.  Can you please call her back and let her know what to do.  CB [phone].  Thanks, [employer]

## 2014-08-13 NOTE — Telephone Encounter (Signed)
Per provider new rx for dilantin was sent to pharmacy.

## 2014-08-29 ENCOUNTER — Telehealth: Payer: Self-pay | Admitting: Family Medicine

## 2014-08-29 DIAGNOSIS — G40009 Localization-related (focal) (partial) idiopathic epilepsy and epileptic syndromes with seizures of localized onset, not intractable, without status epilepticus: Secondary | ICD-10-CM | POA: Diagnosis not present

## 2014-08-29 MED ORDER — NEOMYCIN-POLYMYXIN-HC 3.5-10000-1 OT SUSP: 4.0000 [drp] | Freq: Four times a day (QID) | OTIC | Status: DC

## 2014-08-29 NOTE — Telephone Encounter (Signed)
PJ advised-aa

## 2014-08-29 NOTE — Telephone Encounter (Signed)
Sharon is calling requesting neomycin-polymyxin-hydrocortisone (CORTISPORIN) 3.5-10000-1 otic suspension for patient's ears. She is having ear pain again. Patient is also coming to see you on 09/02/14 and she is requesting a lab order sheet to pick up before her appt. So you can discuss the results with her during her office visit. Please call Sharon Bryan when ready for pick up. [phone]

## 2014-08-29 NOTE — Telephone Encounter (Signed)
rx sent to pharmacy., order printed and ready to pick up.

## 2014-08-30 LAB — PHENYTOIN LEVEL, TOTAL: PHENYTOIN (DILANTIN), SERUM: 1.1 ug/mL — AB (ref 10.0–20.0)

## 2014-09-02 ENCOUNTER — Ambulatory Visit (INDEPENDENT_AMBULATORY_CARE_PROVIDER_SITE_OTHER): Payer: Medicare Other | Admitting: Family Medicine

## 2014-09-02 VITALS — BP 116/84 | HR 92 | Temp 98.5°F | Resp 18 | Wt 261.0 lb

## 2014-09-02 DIAGNOSIS — G40909 Epilepsy, unspecified, not intractable, without status epilepticus: Secondary | ICD-10-CM

## 2014-09-02 DIAGNOSIS — R5383 Other fatigue: Secondary | ICD-10-CM | POA: Diagnosis not present

## 2014-09-02 DIAGNOSIS — L84 Corns and callosities: Secondary | ICD-10-CM | POA: Diagnosis not present

## 2014-09-02 DIAGNOSIS — M797 Fibromyalgia: Secondary | ICD-10-CM | POA: Diagnosis not present

## 2014-09-02 MED ORDER — LAMOTRIGINE 100 MG PO TABS: 100.0000 mg | ORAL_TABLET | Freq: Every day | ORAL | Status: DC

## 2014-09-02 MED ORDER — UREA 40 % EX OINT: TOPICAL_OINTMENT | Freq: Two times a day (BID) | CUTANEOUS | Status: DC

## 2014-09-02 NOTE — Progress Notes (Deleted)
       Patient: Sharon Bryan Female    DOB: 03-27-1965   49 y.o.   MRN: [id number] Visit Date: 09/02/2014  Today's Provider: Lelon Huh, MD   Chief Complaint  Patient presents with  . Seizures    follow up   Subjective:    HPI Seizure disorder Follow up: Patient was last seen 3 weeks ago. Labs were ordered at that visit and the Dilantin level was 0. Patient states she had been taking medication daily as prescribed. No changes were made at that visit. Patient recently had labs done on 08/29/2014 showing the Dilantin level was low but up at 1.1. Patient is here today to review and discuss lab results. Current treatment includes Dilantin 100mg  daily. Patient reports good compliance with treatment and good tolerance. Per patient husband report, since last visit patient has had a couple of small seizures. Patients husband states she has been more confused and had aphasia.     Allergies  Allergen Reactions  . Codeine     increased heart rate ;choking feeling   Previous Medications   ACETAMINOPHEN (TYLENOL) 325 MG TABLET    Take 1 tablet by mouth every 4 (four) hours as needed.   ALPRAZOLAM (XANAX) 1 MG TABLET    Take 1 tablet by mouth every 8 (eight) hours as needed.   AMITRIPTYLINE (ELAVIL) 10 MG TABLET    Take by mouth. 1 - 3 oral at bedtime.   BUSPIRONE (BUSPAR) 30 MG TABLET    TAKE 1 TABLET BY MOUTH TWICE DAILY   ESTRADIOL (ESTRACE) 1 MG TABLET    TAKE 1 TABLET BY MOUTH EVERY DAY   FLUTICASONE (FLONASE) 50 MCG/ACT NASAL SPRAY    USE 2 SPRAYS IN EACH NOSTRIL EVERY DAY   LAMOTRIGINE (LAMICTAL) 150 MG TABLET    Take 1 tablet by mouth 2 (two) times daily.   LYRICA 225 MG CAPSULE    TAKE 1 CAPSULE BY MOUTH TWICE DAILY   MELOXICAM (MOBIC) 15 MG TABLET    Take 1 tablet by mouth daily.   NEOMYCIN-POLYMYXIN-HYDROCORTISONE (CORTISPORIN) 3.5-10000-1 OTIC SUSPENSION    Place 4 drops into both ears 4 (four) times daily.   OMEPRAZOLE (PRILOSEC) 20 MG CAPSULE    TAKE 1 CAPSULE BY MOUTH  ONCE DAILY   PAROXETINE (PAXIL) 20 MG TABLET    Take 1 tablet by mouth daily.   PHENYTOIN (DILANTIN) 100 MG ER CAPSULE    Take 1 capsule (100 mg total) by mouth daily.    Review of Systems  Neurological: Positive for seizures.  Psychiatric/Behavioral: Positive for confusion.  All other systems reviewed and are negative.   History  Substance Use Topics  . Smoking status: Former Smoker    Quit date: 01/25/2007  . Smokeless tobacco: Not on file     Comment: Smoked for about 15 years, smoked about 1 pack a day.  . Alcohol Use: No   Objective:   LMP 04/26/2002  Physical Exam      Assessment & Plan:           Lelon Huh, MD  [facility]

## 2014-09-02 NOTE — Progress Notes (Signed)
Patient: Sharon Bryan Female    DOB: 1965/02/17   49 y.o.   MRN: [id number] Visit Date: 09/02/2014  Today's Provider: Lelon Huh, MD   Chief Complaint  Patient presents with  . Seizures    follow up   Subjective:    HPI    Seizure disorder Follow up: Patient was last seen 3 weeks ago for extreme fatigue and just not feeling right. Labs were ordered at that visit and the Dilantin level was 0. Patient states she had been taking medication daily as prescribed. No changes were made at that visit. She was advised to discard her bottle and get fresh prescription, which she states she has been taking consistently since then. Rechecked labs 08/29/2014 showing the Dilantin level was low but up at 1.1. Patient is here today to review and discuss lab results. Current treatment includes Dilantin 100mg  daily. Patient reports good compliance with treatment and good tolerance. She states that she generally feels much better than she did at last visit. Per patient husband report, since last visit patient has had a couple of small seizures. Patients husband states she has been more confused and having a few episodes aphasia. She states she has had a lot of stress the last several weeks and does not sleep well at night, but feels excessively sleepy during the day. She states that she has issued with lamotrigine making her feel unusually tired in the past. She also had issues taking higher dose of Dilantin in the past, which apparently interacted with some of her other medications causing her to be extremely irritable and emotionally labile.    She also reports thicken areas on the bottoms of her feet.    Results for orders placed or performed in visit on 08/29/14  Dilantin (Phenytoin) level, total  Result Value Ref Range   Phenytoin (Dilantin), Serum 1.1 (L) 10.0 - 20.0 ug/mL    Allergies  Allergen Reactions  . Codeine     increased heart rate ;choking feeling   Previous Medications     ACETAMINOPHEN (TYLENOL) 325 MG TABLET    Take 1 tablet by mouth every 4 (four) hours as needed.   ALPRAZOLAM (XANAX) 1 MG TABLET    Take 1 tablet by mouth every 8 (eight) hours as needed.   AMITRIPTYLINE (ELAVIL) 10 MG TABLET    Take by mouth. 1 - 3 oral at bedtime.   BUSPIRONE (BUSPAR) 30 MG TABLET    TAKE 1 TABLET BY MOUTH TWICE DAILY   ESTRADIOL (ESTRACE) 1 MG TABLET    TAKE 1 TABLET BY MOUTH EVERY DAY   FLUTICASONE (FLONASE) 50 MCG/ACT NASAL SPRAY    USE 2 SPRAYS IN EACH NOSTRIL EVERY DAY   LAMOTRIGINE (LAMICTAL) 150 MG TABLET    Take 1 tablet by mouth 2 (two) times daily.   LYRICA 225 MG CAPSULE    TAKE 1 CAPSULE BY MOUTH TWICE DAILY   MELOXICAM (MOBIC) 15 MG TABLET    Take 1 tablet by mouth daily.   NEOMYCIN-POLYMYXIN-HYDROCORTISONE (CORTISPORIN) 3.5-10000-1 OTIC SUSPENSION    Place 4 drops into both ears 4 (four) times daily.   OMEPRAZOLE (PRILOSEC) 20 MG CAPSULE    TAKE 1 CAPSULE BY MOUTH ONCE DAILY   PAROXETINE (PAXIL) 20 MG TABLET    Take 1 tablet by mouth daily.   PHENYTOIN (DILANTIN) 100 MG ER CAPSULE    Take 1 capsule (100 mg total) by mouth daily.    Review of Systems  Eyes:  Negative for pain.  Neurological: Positive for seizures.  Psychiatric/Behavioral: Positive for confusion.    History  Substance Use Topics  . Smoking status: Former Smoker    Quit date: 01/25/2007  . Smokeless tobacco: Not on file     Comment: Smoked for about 15 years, smoked about 1 pack a day.  . Alcohol Use: No   Objective:   BP 116/84 mmHg  Pulse 92  Temp(Src) 98.5 F (36.9 C) (Oral)  Resp 18  Wt 261 lb (118.389 kg)  SpO2 94%  LMP 04/26/2002    Physical Exam  General appearance: alert, well developed, well nourished, cooperative and in no distress Head: Normocephalic, without obvious abnormality, atraumatic Lungs: Respirations even and unlabored Extremities: No gross deformities Skin: Skin color, texture, turgor normal. No rashes seen. Bilateral heel calluses Psych: Appropriate  mood and affect. Neurologic: Mental status: Alert, oriented to person, place, and time, thought content appropriate.     Assessment & Plan:     1. Fibromyalgia   2. Callus of heel Urea Cream 40@  3. Other fatigue Probably combination of poor sleep, stress, and medications. She feels that her life is a little less hectic and she can start getting more rest at night, and will reduce dose of lamotrigine which she has noted in the past to cause sedation  4. Seizure disorder She is on a fairly complex regiment of epileptic medications and psychotropic medications which were initially managed by neurology and psychiatry. She thinks that lamotrigine may be contributing to sleepiness. She may be having more frequent seizures due to sleep deprivation. She is going to get more sleep. Advised that if she does get significant improvement then she will need to get back in with neurology and psychiatry.  - lamoTRIgine (LAMICTAL) 100 MG tablet; Take 1 tablet (100 mg total) by mouth daily.  Dispense: 60 tablet; Refill: 1        Lelon Huh, MD  [facility]

## 2014-09-03 ENCOUNTER — Telehealth: Payer: Self-pay | Admitting: Family Medicine

## 2014-09-03 MED ORDER — UREA 20 % EX CREA: TOPICAL_CREAM | Freq: Two times a day (BID) | CUTANEOUS | Status: DC | PRN

## 2014-09-03 NOTE — Telephone Encounter (Signed)
The RX called in needs clarification.   Urea oilment not available  Thanks Con Memos

## 2014-09-03 NOTE — Telephone Encounter (Signed)
Clarification given to [facility] pharmacy.

## 2014-09-04 ENCOUNTER — Telehealth: Payer: Self-pay | Admitting: Family Medicine

## 2014-09-04 ENCOUNTER — Other Ambulatory Visit: Payer: Self-pay | Admitting: Family Medicine

## 2014-09-04 NOTE — Telephone Encounter (Signed)
Pal from [facility] wanted to verify the dose for  lamoTRIgine (LAMICTAL) 100 MG tablet because pt told her it had been 150 mg. Thanks TNP

## 2014-09-04 NOTE — Telephone Encounter (Signed)
She was taking 150mg  of Lamotrigine and we reduced dose to 100. I think I mispoke at her office visit and said we reducing it from 200mg  to 150.

## 2014-09-05 NOTE — Telephone Encounter (Signed)
Informed pharmacy.

## 2014-10-05 ENCOUNTER — Other Ambulatory Visit: Payer: Self-pay | Admitting: Family Medicine

## 2014-10-06 ENCOUNTER — Encounter: Payer: Self-pay | Admitting: Family Medicine

## 2014-10-06 ENCOUNTER — Other Ambulatory Visit: Payer: Self-pay | Admitting: Family Medicine

## 2014-10-06 ENCOUNTER — Ambulatory Visit (INDEPENDENT_AMBULATORY_CARE_PROVIDER_SITE_OTHER): Payer: Medicare Other | Admitting: Family Medicine

## 2014-10-06 VITALS — BP 100/68 | HR 86 | Temp 98.9°F | Resp 16 | Wt 261.0 lb

## 2014-10-06 DIAGNOSIS — R42 Dizziness and giddiness: Secondary | ICD-10-CM | POA: Diagnosis not present

## 2014-10-06 DIAGNOSIS — G40909 Epilepsy, unspecified, not intractable, without status epilepticus: Secondary | ICD-10-CM | POA: Diagnosis not present

## 2014-10-06 DIAGNOSIS — J029 Acute pharyngitis, unspecified: Secondary | ICD-10-CM

## 2014-10-06 LAB — POCT RAPID STREP A (OFFICE): RAPID STREP A SCREEN: NEGATIVE

## 2014-10-06 MED ORDER — LAMOTRIGINE 100 MG PO TABS: 100.0000 mg | ORAL_TABLET | Freq: Two times a day (BID) | ORAL | Status: DC

## 2014-10-06 NOTE — Telephone Encounter (Signed)
Rx called in to pharmacy. 

## 2014-10-06 NOTE — Progress Notes (Signed)
Patient: Sharon Bryan Female    DOB: 07-14-1965   49 y.o.   MRN: [id number] Visit Date: 10/06/2014  Today's Provider: Lelon Huh, MD   Chief Complaint  Patient presents with  . Sore Throat  . Headache   Subjective:    Sore Throat  This is a new problem. The current episode started in the past 7 days (x2 days ago). The problem has been unchanged. Neither side of throat is experiencing more pain than the other. The maximum temperature recorded prior to her arrival was 100.4 - 100.9 F. The fever has been present for 3 to 4 days. The pain is moderate. Associated symptoms include coughing, headaches and trouble swallowing. Pertinent negatives include no abdominal pain, congestion, ear discharge, ear pain or shortness of breath. She has had no exposure to strep or mono. She has tried acetaminophen for the symptoms. The treatment provided mild relief.   She states she had terrible headache yesterday, but none today. Overall she is feeling much better today than yesterday. Her throat is a little sore, but much less than yesterday. Has had no sinus congestion today. No cough.   Dizziness follow up After last visit we had her reduce lamotrigine to 100mg  as it was thought she was likely having some side effect or interaction with other medications. She states since that time she has felt much better. Has been more energetic and not feeling sleepy all the time like she was before. She would like to continue 100mg  BID.     Allergies  Allergen Reactions  . Codeine     increased heart rate ;choking feeling   Previous Medications   ACETAMINOPHEN (TYLENOL) 325 MG TABLET    Take 1 tablet by mouth every 4 (four) hours as needed.   ALPRAZOLAM (XANAX) 1 MG TABLET    TAKE 1 TABLET BY MOUTH EVERY 8 HOURS AS NEEDED   AMITRIPTYLINE (ELAVIL) 10 MG TABLET    Take by mouth. 1 - 3 oral at bedtime.   BUSPIRONE (BUSPAR) 30 MG TABLET    TAKE 1 TABLET BY MOUTH TWICE DAILY   ESTRADIOL (ESTRACE) 1  MG TABLET    TAKE 1 TABLET BY MOUTH EVERY DAY   FLUTICASONE (FLONASE) 50 MCG/ACT NASAL SPRAY    USE 2 SPRAYS IN EACH NOSTRIL EVERY DAY   LAMOTRIGINE (LAMICTAL) 100 MG TABLET    Take 1 tablet (100 mg total) by mouth daily.   LYRICA 225 MG CAPSULE    TAKE 1 CAPSULE BY MOUTH TWICE DAILY   MELOXICAM (MOBIC) 15 MG TABLET    TAKE 1 TABLET BY MOUTH EVERY DAY AS NEEDED FOR PAIN   NEOMYCIN-POLYMYXIN-HYDROCORTISONE (CORTISPORIN) 3.5-10000-1 OTIC SUSPENSION    Place 4 drops into both ears 4 (four) times daily.   OMEPRAZOLE (PRILOSEC) 20 MG CAPSULE    TAKE 1 CAPSULE BY MOUTH ONCE DAILY   PAROXETINE (PAXIL) 20 MG TABLET    Take 1 tablet by mouth daily.   PHENYTOIN (DILANTIN) 100 MG ER CAPSULE    Take 1 capsule (100 mg total) by mouth daily.   UREA (CARMOL) 20 % CREAM    Apply topically 2 (two) times daily as needed.    Review of Systems  HENT: Positive for trouble swallowing. Negative for congestion, ear discharge and ear pain.   Respiratory: Positive for cough. Negative for shortness of breath.   Cardiovascular: Negative for chest pain and palpitations.  Gastrointestinal: Negative for abdominal pain.  Neurological: Positive for light-headedness and  headaches.    Social History  Substance Use Topics  . Smoking status: Former Smoker    Quit date: 01/25/2007  . Smokeless tobacco: Not on file     Comment: Smoked for about 15 years, smoked about 1 pack a day.  . Alcohol Use: No   Objective:   BP 100/68 mmHg  Pulse 86  Temp(Src) 98.9 F (37.2 C) (Oral)  Resp 16  Wt 261 lb (118.389 kg)  SpO2 95%  LMP 04/26/2002  Physical Exam  General Appearance:    Alert, cooperative, no distress  HENT:   bilateral TM normal without fluid or infection, neck without nodes, throat normal without erythema or exudate, pharynx erythematous without exudate, sinuses nontender and post nasal drip noted  Eyes:    PERRL, conjunctiva/corneas clear, EOM's intact       Lungs:     Clear to auscultation bilaterally,  respirations unlabored  Heart:    Regular rate and rhythm  Neurologic:   Awake, alert, oriented x 3. No apparent focal neurological           defect.       Results for orders placed or performed in visit on 10/06/14  POCT rapid strep A  Result Value Ref Range   Rapid Strep A Screen Negative Negative       Assessment & Plan:     1. Sore throat Improved today. Rapid strep is negative. Likely viral infection which  Is resolved. Advised to call if symptoms change or worsen.  - POCT rapid strep A  2. Pharyngitis Improving   3. Seizure disorder Feeling much bette since reducing lamotrigine to 100 bid, no more seizures on current medication regiment.  - lamoTRIgine (LAMICTAL) 100 MG tablet; Take 1 tablet (100 mg total) by mouth 2 (two) times daily.  Dispense: 60 tablet; Refill: 3  4. Dizziness Resolved since reducing lamotrigine.         Lelon Huh, MD  [facility]

## 2014-10-06 NOTE — Telephone Encounter (Signed)
Please call in alprazolam.  

## 2014-10-11 ENCOUNTER — Other Ambulatory Visit: Payer: Self-pay | Admitting: Family Medicine

## 2014-10-13 IMAGING — CR DG CHEST 1V PORT
1 series · 1 of 1 positions shown · non-contrast
Comparison: none

REASON FOR EXAM: Chest Pain
COMMENTS:

PROCEDURE:     DXR - DXR PORTABLE CHEST SINGLE VIEW  - September 18, 2012  [DATE]
RESULT:     The lungs are clear. The cardiac silhouette and visualized bony
skeleton are unremarkable.

[ap]
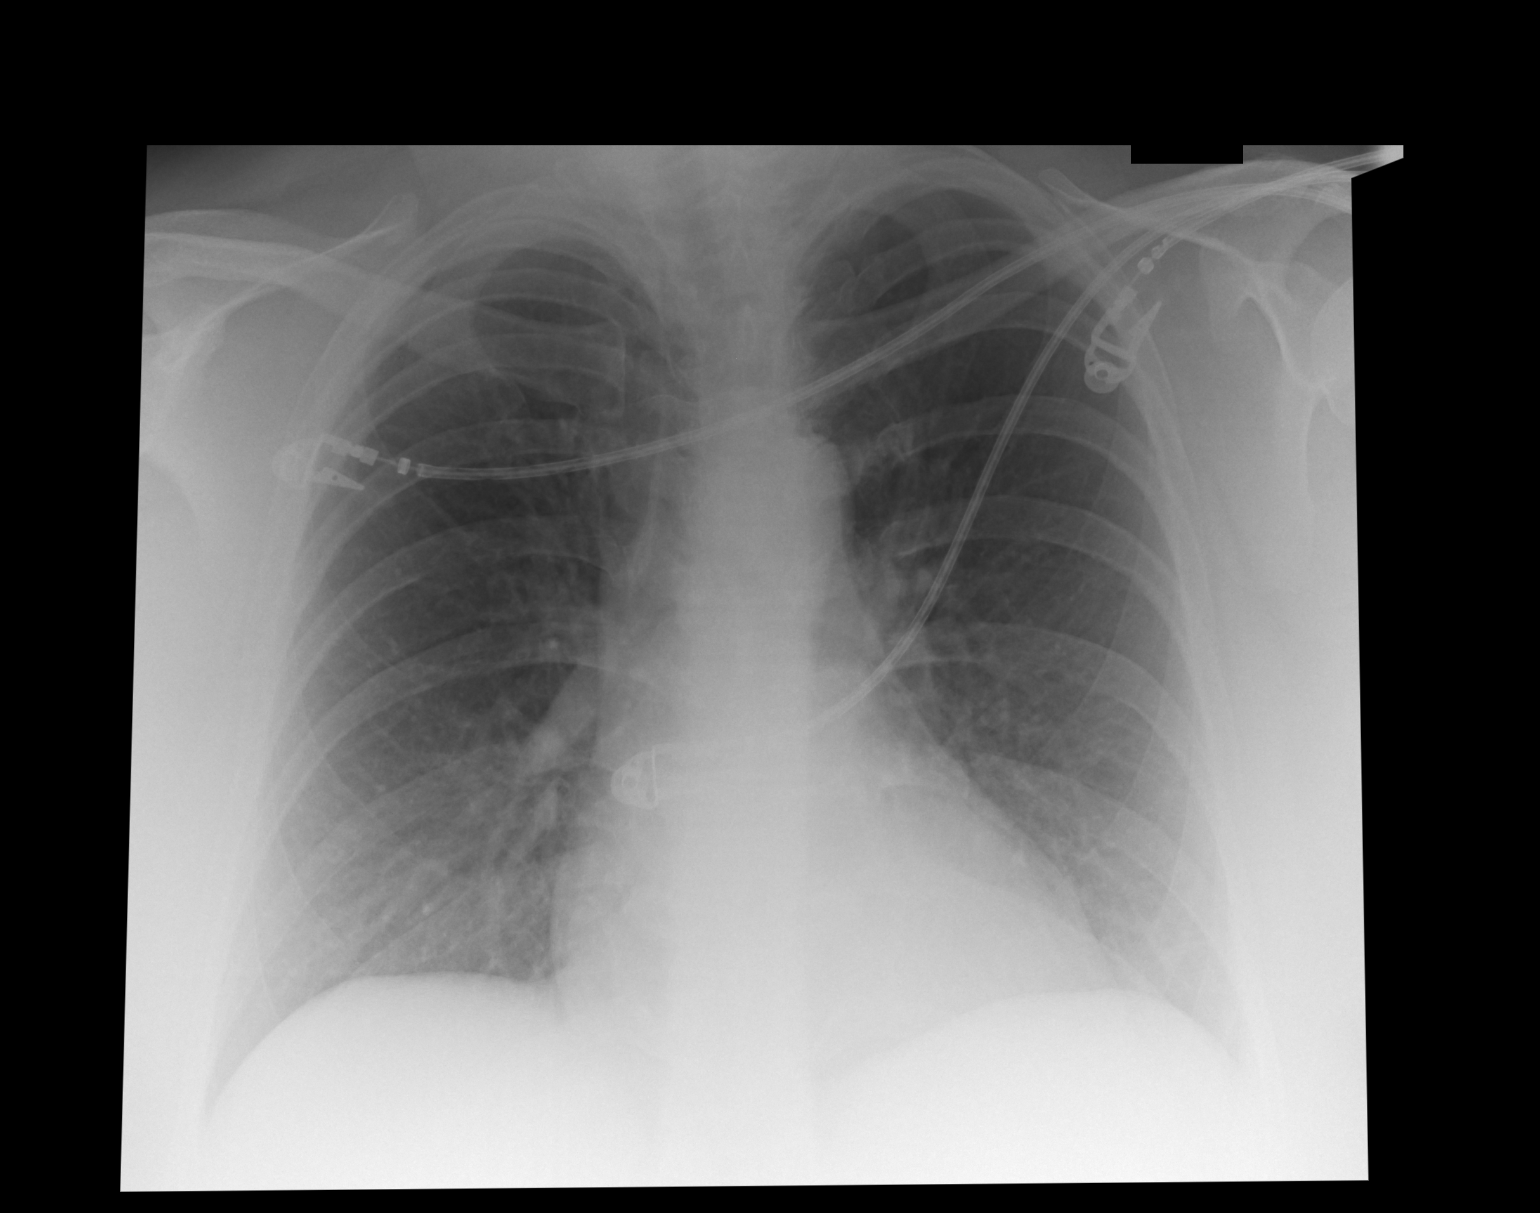

[1 of 1 positions shown; findings below may reference images not displayed]

IMPRESSION: 1. Chest radiograph without evidence of acute cardiopulmonary disease.

## 2014-11-06 ENCOUNTER — Other Ambulatory Visit: Payer: Self-pay | Admitting: Family Medicine

## 2014-11-06 NOTE — Telephone Encounter (Signed)
Please call in alprazolam.  

## 2014-11-07 ENCOUNTER — Other Ambulatory Visit: Payer: Self-pay | Admitting: Family Medicine

## 2014-11-07 NOTE — Telephone Encounter (Addendum)
Medication was phoned into pharmacy this morning. Pharmacy confirmed.

## 2014-11-07 NOTE — Telephone Encounter (Signed)
Refill ALPRAZolam Duanne Moron) 1 MG tablet 11/06/14 -- Birdie Sons, MD TAKE 1 TABLET BY MOUTH EVERY 8 HOURS AS NEEDED  Uses [facility]  Thanks, Con Memos

## 2014-11-07 NOTE — Telephone Encounter (Signed)
Rx called in to pharmacy. 

## 2014-11-26 ENCOUNTER — Ambulatory Visit (INDEPENDENT_AMBULATORY_CARE_PROVIDER_SITE_OTHER): Payer: Medicare Other | Admitting: Family Medicine

## 2014-11-26 ENCOUNTER — Encounter: Payer: Self-pay | Admitting: Family Medicine

## 2014-11-26 VITALS — BP 100/78 | HR 85 | Temp 98.2°F | Resp 16 | Ht 62.0 in | Wt 262.0 lb

## 2014-11-26 DIAGNOSIS — Z1239 Encounter for other screening for malignant neoplasm of breast: Secondary | ICD-10-CM | POA: Diagnosis not present

## 2014-11-26 DIAGNOSIS — F411 Generalized anxiety disorder: Secondary | ICD-10-CM | POA: Diagnosis not present

## 2014-11-26 DIAGNOSIS — Z23 Encounter for immunization: Secondary | ICD-10-CM | POA: Diagnosis not present

## 2014-11-26 DIAGNOSIS — Z803 Family history of malignant neoplasm of breast: Secondary | ICD-10-CM | POA: Diagnosis not present

## 2014-11-26 DIAGNOSIS — R002 Palpitations: Secondary | ICD-10-CM | POA: Diagnosis not present

## 2014-11-26 MED ORDER — PAROXETINE HCL 40 MG PO TABS: 40.0000 mg | ORAL_TABLET | Freq: Every day | ORAL | Status: DC

## 2014-11-26 NOTE — Progress Notes (Signed)
Patient: Sharon Bryan Female    DOB: [birth year]   49 y.o.   MRN: [id number] Visit Date: 11/26/2014  Today's Provider: Lelon Huh, MD   Chief Complaint  Patient presents with  . Palpitations   Subjective:    HPI She states she has been having episodes of feeling her heart race and pound starting a few weeks ago after her sister-in-laws wedding, which apparently triggered some stress family dynamics. She has symptoms throughout the day everyday, but are exacerbated by stressful interactions with family members. She had previously been on 40mg  on paroxetine for anxiety and PTSD, which was reduced to 20mg  by her psychiatrist at the time she was started on lamotrigine. We have since reduced the dose of lamotrigine due to adverse effects of higher dosage. She had been feeling much better until the recent wedding.    Allergies  Allergen Reactions  . Codeine     increased heart rate ;choking feeling   Previous Medications   ACETAMINOPHEN (TYLENOL) 325 MG TABLET    Take 1 tablet by mouth every 4 (four) hours as needed.   ALPRAZOLAM (XANAX) 1 MG TABLET    TAKE 1 TABLET BY MOUTH EVERY 8 HOURS AS NEEDED   AMITRIPTYLINE (ELAVIL) 10 MG TABLET    Take by mouth. 1 - 3 oral at bedtime.   BUSPIRONE (BUSPAR) 30 MG TABLET    TAKE 1 TABLET BY MOUTH TWICE DAILY   ESTRADIOL (ESTRACE) 1 MG TABLET    TAKE 1 TABLET BY MOUTH EVERY DAY   FLUTICASONE (FLONASE) 50 MCG/ACT NASAL SPRAY    USE 2 SPRAYS IN EACH NOSTRIL EVERY DAY   LAMOTRIGINE (LAMICTAL) 100 MG TABLET    Take 1 tablet (100 mg total) by mouth 2 (two) times daily.   LYRICA 225 MG CAPSULE    TAKE 1 CAPSULE BY MOUTH TWICE DAILY   MELOXICAM (MOBIC) 15 MG TABLET    TAKE 1 TABLET BY MOUTH EVERY DAY AS NEEDED FOR PAIN   NEOMYCIN-POLYMYXIN-HYDROCORTISONE (CORTISPORIN) 3.5-10000-1 OTIC SUSPENSION    Place 4 drops into both ears 4 (four) times daily.   OMEPRAZOLE (PRILOSEC) 20 MG CAPSULE    TAKE 1 CAPSULE BY MOUTH ONCE DAILY   PAROXETINE  (PAXIL) 20 MG TABLET    Take 1 tablet by mouth daily.   PHENYTOIN (DILANTIN) 100 MG ER CAPSULE    Take 1 capsule (100 mg total) by mouth daily.   UREA (CARMOL) 20 % CREAM    Apply topically 2 (two) times daily as needed.    Review of Systems  Respiratory: Positive for chest tightness and shortness of breath.        Occasionally has tightness in chest, lasts for 1 second and during that time there is sob  Cardiovascular: Positive for palpitations.       Slight swelling in hands, palpitations for the last 2 weeks almost none stop. No pain in arms or neck.  Neurological: Positive for dizziness and light-headedness. Negative for headaches.       Patient has had some dizziness and light headiness for a while. Patient states this is not unusual     Social History  Substance Use Topics  . Smoking status: Former Smoker    Quit date: 01/25/2007  . Smokeless tobacco: Not on file     Comment: Smoked for about 15 years, smoked about 1 pack a day.  . Alcohol Use: No      Objective:   BP 100/78 mmHg  Pulse 85  Temp(Src) 98.2 F (36.8 C) (Oral)  Resp 16  Ht 5\' 2"  (1.575 m)  Wt 262 lb (118.842 kg)  BMI 47.91 kg/m2  SpO2 94%  LMP 04/26/2002   Depression screen PHQ 2/9 11/26/2014  Decreased Interest 0  Down, Depressed, Hopeless 1  PHQ - 2 Score 1  Altered sleeping 3  Tired, decreased energy 1  Change in appetite 3  Feeling bad or failure about yourself  2  Trouble concentrating 1  Moving slowly or fidgety/restless 0  Suicidal thoughts 0  PHQ-9 Score 11  Difficult doing work/chores Somewhat difficult      Physical Exam   General Appearance:    Alert, cooperative, no distress  Eyes:    PERRL, conjunctiva/corneas clear, EOM's intact       Lungs:     Clear to auscultation bilaterally, respirations unlabored  Heart:    Regular rate and rhythm  Neurologic:   Awake, alert, oriented x 3. No apparent focal neurological           defect.       EKG NSR. No acute changes      Assessment & Plan:     1. Palpitations No sign of cardiac origin. Likely secondary to recent increase in stress related to family dynamics as below - EKG 12-Lead  2. Generalized anxiety disorder Previously was better controlled on 40mg  paraxotine. Will go back up to 40mg  and may take 1/2 alprazolam once or twice during the day as needed - PARoxetine (PAXIL) 40 MG tablet; Take 1 tablet (40 mg total) by mouth daily.  Dispense: 30 tablet; Refill: 3  3. Need for influenza vaccination  - Flu Vaccine QUAD 36+ mos IM  4. Family history of breast cancer - MM Digital Screening; Future       Lelon Huh, MD  [facility]

## 2014-12-15 ENCOUNTER — Encounter: Payer: Self-pay | Admitting: Family Medicine

## 2014-12-15 ENCOUNTER — Ambulatory Visit (INDEPENDENT_AMBULATORY_CARE_PROVIDER_SITE_OTHER): Payer: Medicare Other | Admitting: Family Medicine

## 2014-12-15 VITALS — BP 106/78 | HR 92 | Temp 97.5°F | Resp 16 | Wt 261.0 lb

## 2014-12-15 DIAGNOSIS — R002 Palpitations: Secondary | ICD-10-CM

## 2014-12-15 NOTE — Progress Notes (Signed)
Patient: Sharon Bryan Female    DOB: May 18, 1965   49 y.o.   MRN: [id number] Visit Date: 12/15/2014  Today's Provider: Lelon Huh, MD   Chief Complaint  Patient presents with  . Palpitations    follow up  . Anxiety    follow up   Subjective:    HPI  Anxiety and Palpitations: Patient was last seen 11/26/2014.  Palpitations were thought to be secondary to recent increase in stress related to family Dynamics. Changes made during that visit includes increasing Paroxetine to 40mg  daily and advising patient to take 1/2 Alprazolam once or twice during the day as needed. Today patient comes in stating her heart palpitations are unchanged since the last visit. Patient states he stress at home is about the same. Feels like anxiety is better with increased dose of paroxetine     Allergies  Allergen Reactions  . Codeine     increased heart rate ;choking feeling   Previous Medications   ACETAMINOPHEN (TYLENOL) 325 MG TABLET    Take 1 tablet by mouth every 4 (four) hours as needed.   ALPRAZOLAM (XANAX) 1 MG TABLET    TAKE 1 TABLET BY MOUTH EVERY 8 HOURS AS NEEDED   AMITRIPTYLINE (ELAVIL) 10 MG TABLET    Take by mouth. 1 - 3 oral at bedtime.   BUSPIRONE (BUSPAR) 30 MG TABLET    TAKE 1 TABLET BY MOUTH TWICE DAILY   ESTRADIOL (ESTRACE) 1 MG TABLET    TAKE 1 TABLET BY MOUTH EVERY DAY   FLUTICASONE (FLONASE) 50 MCG/ACT NASAL SPRAY    USE 2 SPRAYS IN EACH NOSTRIL EVERY DAY   LAMOTRIGINE (LAMICTAL) 100 MG TABLET    Take 1 tablet (100 mg total) by mouth 2 (two) times daily.   LYRICA 225 MG CAPSULE    TAKE 1 CAPSULE BY MOUTH TWICE DAILY   MELOXICAM (MOBIC) 15 MG TABLET    TAKE 1 TABLET BY MOUTH EVERY DAY AS NEEDED FOR PAIN   NEOMYCIN-POLYMYXIN-HYDROCORTISONE (CORTISPORIN) 3.5-10000-1 OTIC SUSPENSION    Place 4 drops into both ears 4 (four) times daily.   OMEPRAZOLE (PRILOSEC) 20 MG CAPSULE    TAKE 1 CAPSULE BY MOUTH ONCE DAILY   PAROXETINE (PAXIL) 40 MG TABLET    Take 1 tablet (40  mg total) by mouth daily.   PHENYTOIN (DILANTIN) 100 MG ER CAPSULE    Take 1 capsule (100 mg total) by mouth daily.   UREA (CARMOL) 20 % CREAM    Apply topically 2 (two) times daily as needed.    Review of Systems  Constitutional: Negative for fever, chills, appetite change and fatigue.  Respiratory: Positive for shortness of breath. Negative for chest tightness.   Cardiovascular: Positive for palpitations. Negative for chest pain.  Gastrointestinal: Negative for nausea, vomiting and abdominal pain.  Neurological: Negative for dizziness and weakness.  Psychiatric/Behavioral: The patient is nervous/anxious.     Social History  Substance Use Topics  . Smoking status: Former Smoker    Quit date: 01/25/2007  . Smokeless tobacco: Not on file     Comment: Smoked for about 15 years, smoked about 1 pack a day.  . Alcohol Use: No   Objective:   BP 106/78 mmHg  Pulse 92  Temp(Src) 97.5 F (36.4 C) (Oral)  Resp 16  Wt 261 lb (118.389 kg)  SpO2 96%  LMP 04/26/2002  Physical Exam   General Appearance:    Alert, cooperative, no distress  Eyes:  PERRL, conjunctiva/corneas clear, EOM's intact       Lungs:     Clear to auscultation bilaterally, respirations unlabored  Heart:    Regular rate and rhythm. Occasional missed beat  Neurologic:   Awake, alert, oriented x 3. No apparent focal neurological           defect.           Assessment & Plan:     1. Palpitations Unchanged with increase dose of paroxetine. Will arrange 24 hour holter monitor.        Lelon Huh, MD  [facility]

## 2014-12-16 ENCOUNTER — Other Ambulatory Visit: Payer: Self-pay | Admitting: *Deleted

## 2014-12-16 DIAGNOSIS — R002 Palpitations: Secondary | ICD-10-CM

## 2014-12-17 ENCOUNTER — Ambulatory Visit (HOSPITAL_BASED_OUTPATIENT_CLINIC_OR_DEPARTMENT_OTHER)
Admission: RE | Admit: 2014-12-17 | Discharge: 2014-12-17 | Disposition: A | Discharge status: Home / Self Care | Payer: Medicare Other | Source: Ambulatory Visit | Attending: Family Medicine | Admitting: Family Medicine

## 2014-12-17 ENCOUNTER — Ambulatory Visit
Admission: RE | Admit: 2014-12-17 | Discharge: 2014-12-17 | Disposition: A | Discharge status: Home / Self Care | Payer: Medicare Other | Source: Ambulatory Visit | Attending: Family Medicine | Admitting: Family Medicine

## 2014-12-17 DIAGNOSIS — R009 Unspecified abnormalities of heart beat: Secondary | ICD-10-CM | POA: Insufficient documentation

## 2014-12-17 DIAGNOSIS — R002 Palpitations: Secondary | ICD-10-CM

## 2014-12-22 ENCOUNTER — Telehealth: Payer: Self-pay | Admitting: Family Medicine

## 2014-12-22 NOTE — Telephone Encounter (Signed)
Please call cardio-pulmonary and see if they results. Thanks.

## 2014-12-22 NOTE — Telephone Encounter (Signed)
Husband called wanting to know results of the Holter monitor test that his wife had. Turned in devise on Friday.  Call back  [phone]  Thank sTeri

## 2014-12-22 NOTE — Telephone Encounter (Signed)
Dr. Caryn Section, have you seen results on patient? Please advise. Thanks!

## 2014-12-23 NOTE — Telephone Encounter (Signed)
Pt called back regarding test results from the monitor.  Call back is [phone]  Thanks Con Memos

## 2014-12-23 NOTE — Telephone Encounter (Signed)
Called cardiopulmonary for results and spoke with Jenny Reichmann. He reports that someone from their dept will call back with interpretation.

## 2014-12-24 NOTE — Telephone Encounter (Signed)
LMOVM for pt to return call 

## 2014-12-24 NOTE — Telephone Encounter (Signed)
Dr. Caryn Section, I reviewed patient's chart and looks like Dr. Rockey Situ put the interpretation in. I looked in chart review under the imaging tab. Thanks!

## 2014-12-24 NOTE — Telephone Encounter (Signed)
Patient was notified of results. Patient expressed understanding. 

## 2014-12-24 NOTE — Telephone Encounter (Signed)
Holter monitor shows frequent episodes of tachycardia, which is usually due to stress and anxiety, and occasional PVCs which are benign premature heart beats. These may be contributing to palpitations. She can try taking 500mg  Magnesium citrate daily which usually suppresses PVCs. There were no other arrhythmias.

## 2015-01-01 ENCOUNTER — Other Ambulatory Visit: Payer: Self-pay | Admitting: Family Medicine

## 2015-01-09 ENCOUNTER — Telehealth: Payer: Self-pay | Admitting: Family Medicine

## 2015-01-09 NOTE — Telephone Encounter (Signed)
She would be OK to take Tylenol for throat pain. Can take OTC allegra or claritin for sinus drainage, and either delsym or mucinex for cough and chest congestion.

## 2015-01-09 NOTE — Telephone Encounter (Signed)
PJ stated that he just wanted advise on which OTC medication would best for pt to take with her current medications. PJ also stat that mucinex usually doesn't help pt. Patient has sore throat, cough, and congestion. No fever. Please advise?

## 2015-01-09 NOTE — Telephone Encounter (Signed)
Sharon Bryan would like a nurse to return his call. Pt started having symptoms yesterday. Symptoms: cough, congestion, & sore throat. Sharon wanted to speak with a nurse to see what pt could take over the counter that won't effect the medications pt is taking. Please advise. Thanks TNP

## 2015-01-09 NOTE — Telephone Encounter (Signed)
PJ was notified. Expressed understanding.

## 2015-01-22 ENCOUNTER — Other Ambulatory Visit: Payer: Self-pay | Admitting: Family Medicine

## 2015-01-30 ENCOUNTER — Other Ambulatory Visit: Payer: Self-pay | Admitting: Family Medicine

## 2015-02-02 NOTE — Telephone Encounter (Signed)
Please call in zolpidem  

## 2015-02-03 NOTE — Telephone Encounter (Signed)
Rx called in to pharmacy. 

## 2015-02-04 ENCOUNTER — Other Ambulatory Visit: Payer: Self-pay | Admitting: Family Medicine

## 2015-02-11 ENCOUNTER — Other Ambulatory Visit: Payer: Self-pay | Admitting: Family Medicine

## 2015-02-13 ENCOUNTER — Encounter: Payer: Self-pay | Admitting: Family Medicine

## 2015-02-13 DIAGNOSIS — L859 Epidermal thickening, unspecified: Secondary | ICD-10-CM | POA: Insufficient documentation

## 2015-02-27 ENCOUNTER — Other Ambulatory Visit: Payer: Self-pay | Admitting: Family Medicine

## 2015-03-01 NOTE — Telephone Encounter (Signed)
Please call in alprazolam.  

## 2015-03-04 ENCOUNTER — Other Ambulatory Visit: Payer: Self-pay | Admitting: Family Medicine

## 2015-03-04 NOTE — Telephone Encounter (Signed)
Rx called in to pharmacy. 

## 2015-03-17 ENCOUNTER — Ambulatory Visit (INDEPENDENT_AMBULATORY_CARE_PROVIDER_SITE_OTHER): Payer: Medicare Other | Admitting: Family Medicine

## 2015-03-17 ENCOUNTER — Encounter: Payer: Self-pay | Admitting: Family Medicine

## 2015-03-17 VITALS — BP 120/80 | HR 68 | Temp 97.9°F | Resp 16 | Ht 62.0 in | Wt 266.0 lb

## 2015-03-17 DIAGNOSIS — R002 Palpitations: Secondary | ICD-10-CM | POA: Diagnosis not present

## 2015-03-17 DIAGNOSIS — G40909 Epilepsy, unspecified, not intractable, without status epilepticus: Secondary | ICD-10-CM

## 2015-03-17 DIAGNOSIS — R0600 Dyspnea, unspecified: Secondary | ICD-10-CM

## 2015-03-17 NOTE — Progress Notes (Signed)
Patient: Sharon Bryan Female    DOB: 1965/02/09   50 y.o.   MRN: [id number] Visit Date: 03/17/2015  Today's Provider: Lelon Huh, MD   Chief Complaint  Patient presents with  . Follow-up  . Palpitations   Subjective:    HPI  Follow-up for palpitations from 12/15/2014;  Holter monitor at that time showed episodes of tachycardia and occasional PVCs. She was having a lot of anxiety at the time which was thought to contribute to tachycardia. We increased paroxetine and started magnesium and she states episodes of heart racing greatly improved for a few months. However, over the last couple of weeks, she states she has been having episodes of feeling flutter and pounding when she exerts herself, associated with severe shortness of breath that resolves after resting for a few minutes. She states her anxiety is greatly improved and does not think it is having any effect. Episodes are occuring daily depending on how much she exerts herself. She had myocardial perfusion scan in August of 2014 when she was admitted [facility] for chest pain.    Allergies  Allergen Reactions  . Codeine     increased heart rate ;choking feeling   Previous Medications   ACETAMINOPHEN (TYLENOL) 325 MG TABLET    Take 1 tablet by mouth every 4 (four) hours as needed.   ALPRAZOLAM (XANAX) 1 MG TABLET    TAKE 1 TABLET BY MOUTH EVERY 8 HOURS AS NEEDED   AMITRIPTYLINE (ELAVIL) 10 MG TABLET    Take by mouth. 1 - 3 oral at bedtime.   BUSPIRONE (BUSPAR) 30 MG TABLET    TAKE 1 TABLET BY MOUTH TWICE DAILY   ESTRADIOL (ESTRACE) 1 MG TABLET    TAKE 1 TABLET BY MOUTH EVERY DAY   FLUTICASONE (FLONASE) 50 MCG/ACT NASAL SPRAY    USE 2 SPRAYS IN EACH NOSTRIL EVERY DAY   LAMOTRIGINE (LAMICTAL) 100 MG TABLET    TAKE 1 TABLET BY MOUTH TWICE DAILY   LYRICA 225 MG CAPSULE    TAKE ONE CAPSULE BY MOUTH TWICE DAILY   MELOXICAM (MOBIC) 15 MG TABLET    TAKE 1 TABLET BY MOUTH EVERY DAY AS NEEDED FOR PAIN   NEOMYCIN-POLYMYXIN-HYDROCORTISONE (CORTISPORIN) 3.5-10000-1 OTIC SUSPENSION    Place 4 drops into both ears 4 (four) times daily.   OMEPRAZOLE (PRILOSEC) 20 MG CAPSULE    TAKE 1 CAPSULE BY MOUTH ONCE DAILY   PAROXETINE (PAXIL) 40 MG TABLET    Take 1 tablet (40 mg total) by mouth daily.   PHENYTOIN (DILANTIN) 100 MG ER CAPSULE    TAKE 1 CAPSULE(100 MG) BY MOUTH DAILY   UREA (CARMOL) 20 % CREAM    Apply topically 2 (two) times daily as needed.   UREA (CARMOL) 20 % CREAM    APPLY TO THE AFFECTED AREA TWICE DAILY AS NEEDED    Review of Systems  Constitutional: Negative for fever, chills, appetite change and fatigue.  Respiratory: Negative for chest tightness and shortness of breath.   Cardiovascular: Positive for palpitations. Negative for chest pain.  Gastrointestinal: Negative for nausea, vomiting and abdominal pain.  Neurological: Negative for dizziness and weakness.   Patient Active Problem List   Diagnosis Date Noted  . Hyperkeratosis 02/13/2015  . Dizziness 08/04/2014  . Dysphasia 08/04/2014  . GERD (gastroesophageal reflux disease) 08/04/2014  . Goiter 08/04/2014  . Arthralgia of hip 08/04/2014  . Hot flashes 08/04/2014  . Menopause 08/04/2014  . Migraine 08/04/2014  . Numbness and tingling  of right arm and leg 08/04/2014  . Obesity 08/04/2014  . Sciatica 08/04/2014  . Fibromyalgia 07/18/2014  . Allergic rhinitis 07/18/2014  . Thyroid cyst 10/27/2010  . Malaise and fatigue 12/17/2008  . Anemia, iron deficiency 09/02/2008  . Anxiety disorder 09/02/2008  . Depressive disorder 09/02/2008  . Seizure disorder ([place]) 07/08/2008  . Epilepsy ([place]) 11/10/2003  . 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 Craniotomy  2005    for artetiovenius malformation  . Abdominal hysterectomy  2004    partial, ovaries remain  . Tubal ligation  1998  . Cesarean section  1998     Social History  Substance Use Topics  .  Smoking status: Former Smoker    Quit date: 01/25/2007  . Smokeless tobacco: Not on file     Comment: Smoked for about 15 years, smoked about 1 pack a day.  . Alcohol Use: No   Past Medical History  Diagnosis Date  . Thyroid cyst 10/27/2010  . 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 Bell's palsy   . Anemia, iron deficiency 09/02/2008  . Migraine   . Allergy   . Fibromyalgia     (worsening)  . Numbness and tingling of right arm and leg   . Menopause 2015    Objective:   BP 120/80 mmHg  Pulse 68  Temp(Src) 97.9 F (36.6 C) (Oral)  Resp 16  Ht 5\' 2"  (1.575 m)  Wt 266 lb (120.657 kg)  BMI 48.64 kg/m2  SpO2 97%  LMP 04/26/2002  Physical Exam  General Appearance:    Alert, cooperative, no distress, obese  Eyes:    PERRL, conjunctiva/corneas clear, EOM's intact       Lungs:     Clear to auscultation bilaterally, respirations unlabored  Heart:    Regular rate and rhythm  Neurologic:   Awake, alert, oriented x 3. No apparent focal neurological           defect.           Assessment & Plan:      1. Palpitations Exertional associated with dyspnea. A few PVCs on holter done in November, but recent increase in frequency of episodes. Anxiety is greatly improved and does not seem to a contributing factor at this time. If labs normal will refer to cardiology  - Comprehensive metabolic panel - TSH - Magnesium  2. Seizure disorder (HCC)  - Dilantin (Phenytoin) level, total  3. Dyspnea  - CBC         Lelon Huh, MD  [facility]

## 2015-03-18 LAB — CBC
HEMATOCRIT: 36 % (ref 34.0–46.6)
HEMOGLOBIN: 12 g/dL (ref 11.1–15.9)
MCH: 29.5 pg (ref 26.6–33.0)
MCHC: 33.3 g/dL (ref 31.5–35.7)
MCV: 89 fL (ref 79–97)
Platelets: 311 10*3/uL (ref 150–379)
RBC: 4.07 x10E6/uL (ref 3.77–5.28)
RDW: 13.6 % (ref 12.3–15.4)
WBC: 5.9 10*3/uL (ref 3.4–10.8)

## 2015-03-18 LAB — COMPREHENSIVE METABOLIC PANEL
ALBUMIN: 4.3 g/dL (ref 3.5–5.5)
ALT: 13 IU/L (ref 0–32)
AST: 13 IU/L (ref 0–40)
Albumin/Globulin Ratio: 1.5 (ref 1.1–2.5)
Alkaline Phosphatase: 56 IU/L (ref 39–117)
BUN / CREAT RATIO: 23 (ref 9–23)
BUN: 15 mg/dL (ref 6–24)
Bilirubin Total: <0.2 mg/dL (ref 0.0–1.2)
CALCIUM: 9.4 mg/dL (ref 8.7–10.2)
CO2: 24 mmol/L (ref 18–29)
CREATININE: 0.65 mg/dL (ref 0.57–1.00)
Chloride: 101 mmol/L (ref 96–106)
GFR calc Af Amer: 122 mL/min/{1.73_m2} (ref >59)
GFR, EST NON AFRICAN AMERICAN: 106 mL/min/{1.73_m2} (ref >59)
GLOBULIN, TOTAL: 2.8 g/dL (ref 1.5–4.5)
Glucose: 89 mg/dL (ref 65–99)
Potassium: 4.8 mmol/L (ref 3.5–5.2)
SODIUM: 140 mmol/L (ref 134–144)
Total Protein: 7.1 g/dL (ref 6.0–8.5)

## 2015-03-18 LAB — PHENYTOIN LEVEL, TOTAL: Phenytoin (Dilantin), Serum: 2.4 ug/mL — ABNORMAL LOW (ref 10.0–20.0)

## 2015-03-18 LAB — MAGNESIUM: Magnesium: 2 mg/dL (ref 1.6–2.3)

## 2015-03-18 LAB — TSH: TSH: 3.31 u[IU]/mL (ref 0.450–4.500)

## 2015-03-24 ENCOUNTER — Other Ambulatory Visit: Payer: Self-pay | Admitting: Family Medicine

## 2015-03-25 ENCOUNTER — Telehealth: Payer: Self-pay | Admitting: Family Medicine

## 2015-03-25 DIAGNOSIS — R002 Palpitations: Secondary | ICD-10-CM

## 2015-03-25 NOTE — Telephone Encounter (Signed)
Per results note, referral entered. Please schedule referral for cardiology. Thanks!

## 2015-03-25 NOTE — Telephone Encounter (Signed)
Pt states she was told last week that she would be referred to a heart specialist.  Pt states she has not rec'd an appoinmtment.  [id number]

## 2015-03-29 ENCOUNTER — Other Ambulatory Visit: Payer: Self-pay | Admitting: Family Medicine

## 2015-03-30 DIAGNOSIS — Z6841 Body Mass Index (BMI) 40.0 and over, adult: Secondary | ICD-10-CM | POA: Diagnosis not present

## 2015-03-30 DIAGNOSIS — R0602 Shortness of breath: Secondary | ICD-10-CM | POA: Diagnosis not present

## 2015-03-30 DIAGNOSIS — R002 Palpitations: Secondary | ICD-10-CM | POA: Diagnosis not present

## 2015-03-30 DIAGNOSIS — F411 Generalized anxiety disorder: Secondary | ICD-10-CM | POA: Diagnosis not present

## 2015-03-30 NOTE — Telephone Encounter (Signed)
Rx called in to pharmacy. 

## 2015-03-30 NOTE — Telephone Encounter (Signed)
Please call in alprazolam.  

## 2015-04-10 DIAGNOSIS — R002 Palpitations: Secondary | ICD-10-CM | POA: Diagnosis not present

## 2015-04-10 DIAGNOSIS — R0602 Shortness of breath: Secondary | ICD-10-CM | POA: Diagnosis not present

## 2015-05-07 ENCOUNTER — Other Ambulatory Visit: Payer: Self-pay | Admitting: Family Medicine

## 2015-05-15 ENCOUNTER — Ambulatory Visit: Payer: Medicare Other | Admitting: Family Medicine

## 2015-05-15 DIAGNOSIS — R42 Dizziness and giddiness: Secondary | ICD-10-CM | POA: Diagnosis not present

## 2015-05-15 DIAGNOSIS — F411 Generalized anxiety disorder: Secondary | ICD-10-CM | POA: Diagnosis not present

## 2015-05-15 DIAGNOSIS — N951 Menopausal and female climacteric states: Secondary | ICD-10-CM | POA: Diagnosis not present

## 2015-07-06 ENCOUNTER — Other Ambulatory Visit: Payer: Self-pay | Admitting: Family Medicine

## 2015-07-08 NOTE — Telephone Encounter (Signed)
Pt is requesting RX be sent to [facility] in [place] for LYRICA 225 MG capsule.  States pharmacy requested on Sunday and still have not received anything back.  She ran out to the RX yesterday.

## 2015-07-08 NOTE — Telephone Encounter (Signed)
Rx called in to pharmacy. 

## 2015-07-08 NOTE — Telephone Encounter (Signed)
Please call in Lyrica

## 2015-07-24 ENCOUNTER — Other Ambulatory Visit: Payer: Self-pay | Admitting: Family Medicine

## 2015-09-03 ENCOUNTER — Other Ambulatory Visit: Payer: Self-pay | Admitting: Family Medicine

## 2015-09-11 ENCOUNTER — Ambulatory Visit: Payer: Medicare Other | Admitting: Family Medicine

## 2015-09-30 ENCOUNTER — Other Ambulatory Visit: Payer: Self-pay | Admitting: Family Medicine

## 2015-09-30 NOTE — Telephone Encounter (Signed)
Please call in Lyrica

## 2015-10-01 NOTE — Telephone Encounter (Signed)
Rx called in to pharmacy. 

## 2015-10-06 ENCOUNTER — Telehealth: Payer: Self-pay | Admitting: Family Medicine

## 2015-10-06 MED ORDER — CIPROFLOXACIN HCL 500 MG PO TABS: 500.0000 mg | ORAL_TABLET | Freq: Two times a day (BID) | ORAL | 0 refills | Status: AC

## 2015-10-06 NOTE — Telephone Encounter (Signed)
Have sent rx for cipro to pharmacy. O.v. If not resolved when finished with antibiotic.

## 2015-10-06 NOTE — Telephone Encounter (Signed)
Pt advised.   Thanks,   -Bereket Gernert  

## 2015-10-06 NOTE — Telephone Encounter (Signed)
Pt reports her symptoms started around Saturday, Urgency, Burning, Frequency.  She denies, fevers, and she does not see blood in her urine. She has been using Azo with some relief.  She has had UTI's before and she reports her symptoms are similar.    She says she is not able to come in the week secondary to not having a ride to [place].  Please advise.    Thanks,   -Mickel Baas

## 2015-10-06 NOTE — Telephone Encounter (Signed)
Please advise 

## 2015-10-06 NOTE — Telephone Encounter (Signed)
Pt called saying she is pretty sure she has an UTI but cannot come in this week.  She wants to know if you can call in something.  She uses [employer] back is [phone]  Thanks Con Memos

## 2015-10-06 NOTE — Telephone Encounter (Signed)
Need more information. What are symptoms.

## 2015-10-20 ENCOUNTER — Other Ambulatory Visit: Payer: Self-pay | Admitting: Family Medicine

## 2015-10-21 ENCOUNTER — Other Ambulatory Visit: Payer: Self-pay | Admitting: Family Medicine

## 2015-10-22 NOTE — Telephone Encounter (Signed)
Refill request for Alprazolam 1 mg 1 tab q8hs prn Last filled by MD on- 07/26/2015 #90 x2 Last Appt: 02/26/2015 Next Appt: none Please advise refill?

## 2015-10-22 NOTE — Telephone Encounter (Signed)
Rx called in to pharmacy. 

## 2015-10-30 ENCOUNTER — Encounter: Payer: Self-pay | Admitting: Family Medicine

## 2015-10-30 ENCOUNTER — Ambulatory Visit (INDEPENDENT_AMBULATORY_CARE_PROVIDER_SITE_OTHER): Payer: Medicare Other | Admitting: Family Medicine

## 2015-10-30 VITALS — BP 110/70 | HR 82 | Temp 94.0°F | Resp 16 | Wt 273.0 lb

## 2015-10-30 DIAGNOSIS — R232 Flushing: Secondary | ICD-10-CM

## 2015-10-30 DIAGNOSIS — Z23 Encounter for immunization: Secondary | ICD-10-CM

## 2015-10-30 DIAGNOSIS — R61 Generalized hyperhidrosis: Secondary | ICD-10-CM | POA: Diagnosis not present

## 2015-10-30 DIAGNOSIS — R300 Dysuria: Secondary | ICD-10-CM

## 2015-10-30 DIAGNOSIS — G40909 Epilepsy, unspecified, not intractable, without status epilepticus: Secondary | ICD-10-CM

## 2015-10-30 LAB — POCT URINALYSIS DIPSTICK
BILIRUBIN UA: NEGATIVE
Blood, UA: NEGATIVE
Glucose, UA: NEGATIVE
KETONES UA: NEGATIVE
LEUKOCYTES UA: NEGATIVE
NITRITE UA: NEGATIVE
Protein, UA: NEGATIVE
Spec Grav, UA: 1.015
Urobilinogen, UA: 0.2
pH, UA: 6

## 2015-10-30 MED ORDER — UREA 20 % EX CREA: TOPICAL_CREAM | CUTANEOUS | 3 refills | Status: DC

## 2015-10-30 NOTE — Progress Notes (Signed)
Patient: Sharon Bryan Female    DOB: 1966-01-05   50 y.o.   MRN: [id number] Visit Date: 10/30/2015  Today's Provider: Lelon Huh, MD   Chief Complaint  Patient presents with  . Medication Refill  . Anxiety  . Seizures   Subjective:    HPI  Generalized anxiety disorder:  From 11/26/2014-Paroxetine was increase to 40mg  daily which was very effective  Seizure disorder: From 03/17/2015-no changes. Is tolerating current medications well. Last seizure was 4-5 months ago.   Two week history of episodes of face and right arm turing red. Episodes dizziness and lightheadedness. Was occurring every day, now QOD. Niece and 3 kids moved in with her 3-4 weeks ago.   Wt Readings from Last 3 Encounters:  10/30/15 273 lb (123.8 kg)  03/17/15 266 lb (120.7 kg)  12/15/14 261 lb (118.4 kg)    Allergies  Allergen Reactions  . Codeine     increased heart rate ;choking feeling     Current Outpatient Prescriptions:  .  acetaminophen (TYLENOL) 325 MG tablet, Take 1 tablet by mouth every 4 (four) hours as needed., Disp: , Rfl:  .  ALPRAZolam (XANAX) 1 MG tablet, TAKE 1 TABLET BY MOUTH EVERY 8 HOURS AS NEEDED, Disp: 90 tablet, Rfl: 0 .  amitriptyline (ELAVIL) 10 MG tablet, TAKE 1 TO 3 TABLETS BY MOUTH EVERY NIGHT AT BEDTIME, Disp: 270 tablet, Rfl: 2 .  busPIRone (BUSPAR) 30 MG tablet, TAKE 1 TABLET BY MOUTH TWICE DAILY, Disp: 180 tablet, Rfl: 4 .  estradiol (ESTRACE) 1 MG tablet, TAKE 1 TABLET BY MOUTH EVERY DAY, Disp: 90 tablet, Rfl: 4 .  fluticasone (FLONASE) 50 MCG/ACT nasal spray, USE 2 SPRAYS IN EACH NOSTRIL EVERY DAY, Disp: 48 g, Rfl: 3 .  lamoTRIgine (LAMICTAL) 100 MG tablet, TAKE 1 TABLET BY MOUTH TWICE DAILY, Disp: 60 tablet, Rfl: 5 .  LYRICA 225 MG capsule, TAKE ONE CAPSULE BY MOUTH TWICE DAILY, Disp: 60 capsule, Rfl: 5 .  meloxicam (MOBIC) 15 MG tablet, TAKE 1 TABLET BY MOUTH EVERY DAY AS NEEDED FOR PAIN, Disp: 90 tablet, Rfl: 3 .  neomycin-polymyxin-hydrocortisone  (CORTISPORIN) 3.5-10000-1 otic suspension, Place 4 drops into both ears 4 (four) times daily., Disp: 10 mL, Rfl: 1 .  omeprazole (PRILOSEC) 20 MG capsule, TAKE 1 CAPSULE BY MOUTH ONCE DAILY, Disp: 30 capsule, Rfl: 12 .  PARoxetine (PAXIL) 40 MG tablet, TAKE 1 TABLET BY MOUTH EVERY DAY, Disp: 30 tablet, Rfl: 12 .  phenytoin (DILANTIN) 100 MG ER capsule, TAKE 1 CAPSULE(100 MG) BY MOUTH DAILY, Disp: 30 capsule, Rfl: 12 .  urea (CARMOL) 20 % cream, Apply topically 2 (two) times daily as needed., Disp: 30 g, Rfl: 0 .  urea (CARMOL) 20 % cream, APPLY TO THE AFFECTED AREA TWICE DAILY AS NEEDED, Disp: 85 g, Rfl: 3  Review of Systems  Constitutional: Positive for fatigue. Negative for appetite change, chills and fever.  Respiratory: Negative for chest tightness and shortness of breath.   Cardiovascular: Negative for chest pain and palpitations.  Gastrointestinal: Positive for nausea. Negative for abdominal pain and vomiting.  Neurological: Positive for light-headedness. Negative for dizziness and weakness.    Social History  Substance Use Topics  . Smoking status: Former Smoker    Quit date: 01/25/2007  . Smokeless tobacco: Not on file     Comment: Smoked for about 15 years, smoked about 1 pack a day.  . Alcohol use No   Objective:   BP 110/70 (BP Location:  Left Arm, Patient Position: Sitting, Cuff Size: Large)   Pulse 82   Temp (!) 94 F (34.4 C) (Oral)   Resp 16   Wt 273 lb (123.8 kg)   LMP 04/26/2002   SpO2 94%   BMI 49.93 kg/m   Physical Exam  General Appearance:    Alert, cooperative, no distress, obese  Eyes:    PERRL, conjunctiva/corneas clear, EOM's intact       Lungs:     Clear to auscultation bilaterally, respirations unlabored  Heart:    Regular rate and rhythm  Neurologic:   Awake, alert, oriented x 3. No apparent focal neurological           defect.           Assessment & Plan:     1. Diaphoresis Possibly related to stress of niece and several children moving in,  although she states she likes the company.  - CBC - Comprehensive metabolic panel - TSH - FSH - LH - Estrogens, Total  2. Need for influenza vaccination  - Flu Vaccine QUAD 36+ mos IM  3. Hot flashes  - TSH - FSH - LH - Estrogens, Total  4. Seizure disorder (HCC)  - Dilantin (Phenytoin) level, total  5. Dysuria  - POCT Urinalysis Dipstick       Lelon Huh, MD  [facility]

## 2015-11-02 LAB — CBC
Hematocrit: 34.8 % (ref 34.0–46.6)
Hemoglobin: 11.8 g/dL (ref 11.1–15.9)
MCH: 30.3 pg (ref 26.6–33.0)
MCHC: 33.9 g/dL (ref 31.5–35.7)
MCV: 90 fL (ref 79–97)
PLATELETS: 279 10*3/uL (ref 150–379)
RBC: 3.89 x10E6/uL (ref 3.77–5.28)
RDW: 12.9 % (ref 12.3–15.4)
WBC: 7.6 10*3/uL (ref 3.4–10.8)

## 2015-11-02 LAB — COMPREHENSIVE METABOLIC PANEL
ALK PHOS: 60 IU/L (ref 39–117)
ALT: 13 IU/L (ref 0–32)
AST: 14 IU/L (ref 0–40)
Albumin/Globulin Ratio: 1.4 (ref 1.2–2.2)
Albumin: 4 g/dL (ref 3.5–5.5)
BUN/Creatinine Ratio: 14 (ref 9–23)
BUN: 11 mg/dL (ref 6–24)
CHLORIDE: 103 mmol/L (ref 96–106)
CO2: 23 mmol/L (ref 18–29)
Calcium: 9.4 mg/dL (ref 8.7–10.2)
Creatinine, Ser: 0.76 mg/dL (ref 0.57–1.00)
GFR calc Af Amer: 107 mL/min/{1.73_m2} (ref >59)
GFR calc non Af Amer: 93 mL/min/{1.73_m2} (ref >59)
GLUCOSE: 85 mg/dL (ref 65–99)
Globulin, Total: 2.9 g/dL (ref 1.5–4.5)
Potassium: 4.3 mmol/L (ref 3.5–5.2)
Sodium: 141 mmol/L (ref 134–144)
TOTAL PROTEIN: 6.9 g/dL (ref 6.0–8.5)

## 2015-11-02 LAB — ESTROGENS, TOTAL: ESTROGEN: 109 pg/mL

## 2015-11-02 LAB — PHENYTOIN LEVEL, TOTAL: Phenytoin (Dilantin), Serum: 1.1 ug/mL — ABNORMAL LOW (ref 10.0–20.0)

## 2015-11-02 LAB — LUTEINIZING HORMONE: LH: 38.6 m[IU]/mL

## 2015-11-02 LAB — TSH: TSH: 2.94 u[IU]/mL (ref 0.450–4.500)

## 2015-11-02 LAB — FOLLICLE STIMULATING HORMONE: FSH: 55.7 m[IU]/mL

## 2015-11-03 ENCOUNTER — Telehealth: Payer: Self-pay | Admitting: *Deleted

## 2015-11-03 MED ORDER — CONJ ESTROG-MEDROXYPROGEST ACE 0.625-2.5 MG PO TABS: 1.0000 | ORAL_TABLET | Freq: Every day | ORAL | 1 refills | Status: DC

## 2015-11-03 NOTE — Telephone Encounter (Signed)
Patient was notified of results. Patient expressed understanding. Rx was sent to pharmacy.  

## 2015-11-03 NOTE — Telephone Encounter (Signed)
-----   Message from Birdie Sons, MD sent at 11/02/2015  8:40 PM EDT ----- Labs are consistent with menopause, which is probably related to hot flashes. All other labs are normal. Recommend she start taking Prempro 0.625/2.5mg  one tablet daily, #30, rf x 1. Follow up one month.

## 2015-11-06 ENCOUNTER — Other Ambulatory Visit: Payer: Self-pay | Admitting: Physician Assistant

## 2015-11-11 DIAGNOSIS — R42 Dizziness and giddiness: Secondary | ICD-10-CM | POA: Diagnosis not present

## 2015-11-11 DIAGNOSIS — G4734 Idiopathic sleep related nonobstructive alveolar hypoventilation: Secondary | ICD-10-CM | POA: Diagnosis not present

## 2015-11-11 DIAGNOSIS — R002 Palpitations: Secondary | ICD-10-CM | POA: Diagnosis not present

## 2015-11-14 ENCOUNTER — Other Ambulatory Visit: Payer: Self-pay | Admitting: Family Medicine

## 2015-11-16 ENCOUNTER — Other Ambulatory Visit: Payer: Self-pay | Admitting: Physician Assistant

## 2015-11-16 ENCOUNTER — Other Ambulatory Visit: Payer: Self-pay | Admitting: *Deleted

## 2015-11-16 MED ORDER — ALPRAZOLAM 1 MG PO TABS: 1.0000 mg | ORAL_TABLET | Freq: Three times a day (TID) | ORAL | 5 refills | Status: DC | PRN

## 2015-11-16 NOTE — Telephone Encounter (Signed)
Please call in alprazolam.  

## 2015-11-17 NOTE — Telephone Encounter (Signed)
Rx called in to pharmacy. 

## 2015-12-15 ENCOUNTER — Other Ambulatory Visit: Payer: Self-pay | Admitting: Family Medicine

## 2015-12-25 ENCOUNTER — Other Ambulatory Visit: Payer: Self-pay | Admitting: Family Medicine

## 2016-01-01 ENCOUNTER — Other Ambulatory Visit: Payer: Self-pay | Admitting: Family Medicine

## 2016-01-11 ENCOUNTER — Encounter: Payer: Self-pay | Admitting: Family Medicine

## 2016-01-11 ENCOUNTER — Ambulatory Visit (INDEPENDENT_AMBULATORY_CARE_PROVIDER_SITE_OTHER): Payer: Medicare Other | Admitting: Family Medicine

## 2016-01-11 VITALS — BP 122/70 | HR 96 | Temp 98.8°F | Resp 16 | Wt 273.0 lb

## 2016-01-11 DIAGNOSIS — M797 Fibromyalgia: Secondary | ICD-10-CM | POA: Diagnosis not present

## 2016-01-11 MED ORDER — PREDNISONE 10 MG PO TABS: ORAL_TABLET | ORAL | 0 refills | Status: AC

## 2016-01-11 NOTE — Progress Notes (Signed)
Patient: Sharon Bryan Female    DOB: 09-19-1965   50 y.o.   MRN: [id number] Visit Date: 01/11/2016  Today's Provider: Lelon Huh, MD   Chief Complaint  Patient presents with  . Fibromyalgia   Subjective:    HPI Patient comes in today c/o a fibromyalgia attack. Patient reports that she has had symptoms X 1 week. She reports that some days are worse than others. She currently takes Lyrica 225mg  twice daily for fibromyalgia. She also reports that she has been taking Tylenol to help with the pain and it has not helped. Has had no sore throat or cough. States it feels very much like previous flare ups of fibromyalgia which responded well to prednisone.     Allergies  Allergen Reactions  . Codeine     increased heart rate ;choking feeling     Current Outpatient Prescriptions:  .  acetaminophen (TYLENOL) 325 MG tablet, Take 1 tablet by mouth every 4 (four) hours as needed., Disp: , Rfl:  .  ALPRAZolam (XANAX) 1 MG tablet, Take 1 tablet (1 mg total) by mouth every 8 (eight) hours as needed., Disp: 90 tablet, Rfl: 5 .  amitriptyline (ELAVIL) 10 MG tablet, TAKE 1 TO 3 TABLETS BY MOUTH EVERY NIGHT AT BEDTIME, Disp: 270 tablet, Rfl: 4 .  busPIRone (BUSPAR) 30 MG tablet, TAKE 1 TABLET BY MOUTH TWICE DAILY, Disp: 180 tablet, Rfl: 4 .  estradiol (ESTRACE) 1 MG tablet, TAKE 1 TABLET BY MOUTH EVERY DAY, Disp: 90 tablet, Rfl: 4 .  fluticasone (FLONASE) 50 MCG/ACT nasal spray, USE 2 SPRAYS IN EACH NOSTRIL EVERY DAY, Disp: 48 g, Rfl: 3 .  lamoTRIgine (LAMICTAL) 100 MG tablet, TAKE 1 TABLET BY MOUTH TWICE DAILY, Disp: 60 tablet, Rfl: 5 .  LYRICA 225 MG capsule, TAKE ONE CAPSULE BY MOUTH TWICE DAILY, Disp: 60 capsule, Rfl: 5 .  Magnesium Oxide 420 MG TABS, Take 400 mg by mouth daily., Disp: , Rfl:  .  meloxicam (MOBIC) 15 MG tablet, TAKE 1 TABLET BY MOUTH EVERY DAY AS NEEDED FOR PAIN, Disp: 90 tablet, Rfl: 1 .  neomycin-polymyxin-hydrocortisone (CORTISPORIN) 3.5-10000-1 otic  suspension, Place 4 drops into both ears 4 (four) times daily., Disp: 10 mL, Rfl: 1 .  omeprazole (PRILOSEC) 20 MG capsule, TAKE 1 CAPSULE BY MOUTH ONCE DAILY, Disp: 30 capsule, Rfl: 12 .  PARoxetine (PAXIL) 40 MG tablet, TAKE 1 TABLET BY MOUTH EVERY DAY, Disp: 30 tablet, Rfl: 12 .  phenytoin (DILANTIN) 100 MG ER capsule, TAKE 1 CAPSULE(100 MG) BY MOUTH DAILY, Disp: 30 capsule, Rfl: 12 .  PREMPRO 0.625-2.5 MG tablet, TAKE 1 TABLET BY MOUTH DAILY, Disp: 28 tablet, Rfl: 11 .  urea (CARMOL) 20 % cream, APPLY TO THE AFFECTED AREA TWICE DAILY AS NEEDED, Disp: 85 g, Rfl: 3  Review of Systems  Constitutional: Positive for activity change and fatigue.  Respiratory: Negative.   Cardiovascular: Negative.   Musculoskeletal: Positive for arthralgias, back pain, joint swelling, myalgias, neck pain and neck stiffness.  Neurological: Positive for headaches.    Social History  Substance Use Topics  . Smoking status: Former Smoker    Quit date: 01/25/2007  . Smokeless tobacco: Not on file     Comment: Smoked for about 15 years, smoked about 1 pack a day.  . Alcohol use No   Objective:   BP 122/70   Pulse 96   Temp 98.8 F (37.1 C)   Resp 16   Wt 273 lb (123.8 kg)  LMP 04/26/2002   SpO2 96%   BMI 49.93 kg/m   Physical Exam   General Appearance:    Alert, cooperative, no distress  Eyes:    PERRL, conjunctiva/corneas clear, EOM's intact       Lungs:     Clear to auscultation bilaterally, respirations unlabored  Heart:    Regular rate and rhythm  Neurologic:   Awake, alert, oriented x 3. No apparent focal neurological           defect.   MS:   Tender along fibromyalgia trigger points of both arms neck and back.        Assessment & Plan:     1. Fibromyalgia  - predniSONE (DELTASONE) 10 MG tablet; 6 tablets for 1 day, then 5 for 1 day, then 4 for 1 day, then 3 for 1 day, then 2 for 1 day then 1 daily as needed until gone  Dispense: 42 tablet; Refill: 0     The entirety of the  information documented in the History of Present Illness, Review of Systems and Physical Exam were personally obtained by me. Portions of this information were initially documented by Wilburt Finlay, CMA and reviewed by me for thoroughness and accuracy.    Lelon Huh, MD  [facility]

## 2016-02-04 ENCOUNTER — Encounter: Payer: Self-pay | Admitting: Family Medicine

## 2016-02-04 ENCOUNTER — Ambulatory Visit (INDEPENDENT_AMBULATORY_CARE_PROVIDER_SITE_OTHER): Payer: Medicare Other | Admitting: Family Medicine

## 2016-02-04 VITALS — BP 118/70 | HR 92 | Temp 98.0°F | Resp 16 | Ht 62.0 in | Wt 277.0 lb

## 2016-02-04 DIAGNOSIS — G40909 Epilepsy, unspecified, not intractable, without status epilepticus: Secondary | ICD-10-CM | POA: Diagnosis not present

## 2016-02-04 DIAGNOSIS — R002 Palpitations: Secondary | ICD-10-CM | POA: Diagnosis not present

## 2016-02-04 DIAGNOSIS — R61 Generalized hyperhidrosis: Secondary | ICD-10-CM | POA: Diagnosis not present

## 2016-02-04 DIAGNOSIS — M797 Fibromyalgia: Secondary | ICD-10-CM

## 2016-02-04 DIAGNOSIS — G40009 Localization-related (focal) (partial) idiopathic epilepsy and epileptic syndromes with seizures of localized onset, not intractable, without status epilepticus: Secondary | ICD-10-CM | POA: Diagnosis not present

## 2016-02-04 NOTE — Progress Notes (Signed)
Patient: Sharon Bryan Female    DOB: [birth year]   51 y.o.   MRN: [id number] Visit Date: 02/04/2016  Today's Provider: Lelon Huh, MD   Chief Complaint  Patient presents with  . Neck Pain  . Back Pain   Subjective:    Patient has been having symptoms of dizziness, sweating and nausea.   States episodes feels like hot flashes she had in the past and was prescribed hormone replacement which she is still taking. No fevers. No cold symptoms. She also reports having two episodes of having to take an extra dilantin due to onset of slight seizure activity. Nausea is typically just in the morning when she also feels a little bit light headed.   Fibromyalgia From 01/11/2016-patient was given rx for prednisone but reports only  Minimal improvement in muscle pains. Still has pain every day.     Allergies  Allergen Reactions  . Codeine     increased heart rate ;choking feeling     Current Outpatient Prescriptions:  .  acetaminophen (TYLENOL) 325 MG tablet, Take 1 tablet by mouth every 4 (four) hours as needed., Disp: , Rfl:  .  ALPRAZolam (XANAX) 1 MG tablet, Take 1 tablet (1 mg total) by mouth every 8 (eight) hours as needed., Disp: 90 tablet, Rfl: 5 .  amitriptyline (ELAVIL) 10 MG tablet, TAKE 1 TO 3 TABLETS BY MOUTH EVERY NIGHT AT BEDTIME, Disp: 270 tablet, Rfl: 4 .  busPIRone (BUSPAR) 30 MG tablet, TAKE 1 TABLET BY MOUTH TWICE DAILY, Disp: 180 tablet, Rfl: 4 .  estradiol (ESTRACE) 1 MG tablet, TAKE 1 TABLET BY MOUTH EVERY DAY, Disp: 90 tablet, Rfl: 4 .  fluticasone (FLONASE) 50 MCG/ACT nasal spray, USE 2 SPRAYS IN EACH NOSTRIL EVERY DAY, Disp: 48 g, Rfl: 3 .  lamoTRIgine (LAMICTAL) 100 MG tablet, TAKE 1 TABLET BY MOUTH TWICE DAILY, Disp: 60 tablet, Rfl: 5 .  LYRICA 225 MG capsule, TAKE ONE CAPSULE BY MOUTH TWICE DAILY, Disp: 60 capsule, Rfl: 5 .  Magnesium Oxide 420 MG TABS, Take 400 mg by mouth daily., Disp: , Rfl:  .  meloxicam (MOBIC) 15 MG tablet, TAKE 1 TABLET  BY MOUTH EVERY DAY AS NEEDED FOR PAIN, Disp: 90 tablet, Rfl: 1 .  neomycin-polymyxin-hydrocortisone (CORTISPORIN) 3.5-10000-1 otic suspension, Place 4 drops into both ears 4 (four) times daily., Disp: 10 mL, Rfl: 1 .  omeprazole (PRILOSEC) 20 MG capsule, TAKE 1 CAPSULE BY MOUTH ONCE DAILY, Disp: 30 capsule, Rfl: 12 .  PARoxetine (PAXIL) 40 MG tablet, TAKE 1 TABLET BY MOUTH EVERY DAY, Disp: 30 tablet, Rfl: 12 .  phenytoin (DILANTIN) 100 MG ER capsule, TAKE 1 CAPSULE(100 MG) BY MOUTH DAILY, Disp: 30 capsule, Rfl: 12 .  PREMPRO 0.625-2.5 MG tablet, TAKE 1 TABLET BY MOUTH DAILY, Disp: 28 tablet, Rfl: 11 .  urea (CARMOL) 20 % cream, APPLY TO THE AFFECTED AREA TWICE DAILY AS NEEDED, Disp: 85 g, Rfl: 3  Review of Systems  Constitutional: Negative for appetite change, chills and fatigue.  Respiratory: Negative for chest tightness and shortness of breath.   Cardiovascular: Negative for palpitations.  Gastrointestinal: Positive for nausea. Negative for vomiting.  Neurological: Positive for dizziness.    Social History  Substance Use Topics  . Smoking status: Former Smoker    Quit date: 01/25/2007  . Smokeless tobacco: Not on file     Comment: Smoked for about 15 years, smoked about 1 pack a day.  . Alcohol use No   Objective:  BP 118/70 (BP Location: Right Arm, Patient Position: Sitting, Cuff Size: Large)   Pulse 92   Temp 98 F (36.7 C) (Oral)   Resp 16   Ht 5\' 2"  (1.575 m)   Wt 277 lb (125.6 kg)   LMP 04/26/2002   SpO2 98%   BMI 50.66 kg/m   Physical Exam  General Appearance:    Alert, cooperative, no distress, obese  Eyes:    PERRL, conjunctiva/corneas clear, EOM's intact       Lungs:     Clear to auscultation bilaterally, respirations unlabored  Heart:    Regular rate and rhythm  Neurologic:   Awake, alert, oriented x 3. No apparent focal neurological           defect.           Assessment & Plan:     1. Diaphoresis  - T4 AND TSH - CBC - Comprehensive metabolic panel -  Estrogens, Total - 17-Hydroxyprogesterone - FSH - LH  2. Partial idiopathic epilepsy with seizures of localized onset, not intractable, without status epilepticus ([place])  - T4 AND TSH - CBC - Comprehensive metabolic panel - Estrogens, Total - 17-Hydroxyprogesterone - FSH - LH  3. Fibromyalgia Minimal improvement with recent course of prednisone.  - T4 AND TSH - CBC - Comprehensive metabolic panel - Estrogens, Total - 17-Hydroxyprogesterone - FSH - LH      Lelon Huh, MD  [facility]

## 2016-02-07 LAB — COMPREHENSIVE METABOLIC PANEL
ALK PHOS: 51 IU/L (ref 39–117)
ALT: 19 IU/L (ref 0–32)
AST: 19 IU/L (ref 0–40)
Albumin/Globulin Ratio: 1.2 (ref 1.2–2.2)
Albumin: 4.1 g/dL (ref 3.5–5.5)
BUN/Creatinine Ratio: 31 — ABNORMAL HIGH (ref 9–23)
BUN: 21 mg/dL (ref 6–24)
Bilirubin Total: <0.2 mg/dL (ref 0.0–1.2)
CO2: 22 mmol/L (ref 18–29)
CREATININE: 0.67 mg/dL (ref 0.57–1.00)
Calcium: 9.4 mg/dL (ref 8.7–10.2)
Chloride: 101 mmol/L (ref 96–106)
GFR calc Af Amer: 120 mL/min/{1.73_m2} (ref >59)
GFR calc non Af Amer: 104 mL/min/{1.73_m2} (ref >59)
GLOBULIN, TOTAL: 3.3 g/dL (ref 1.5–4.5)
GLUCOSE: 93 mg/dL (ref 65–99)
Potassium: 4.7 mmol/L (ref 3.5–5.2)
SODIUM: 141 mmol/L (ref 134–144)
Total Protein: 7.4 g/dL (ref 6.0–8.5)

## 2016-02-07 LAB — 17-HYDROXYPROGESTERONE: 17 HYDROXYPROGESTERONE: 16 ng/dL

## 2016-02-07 LAB — CBC
Hematocrit: 35.6 % (ref 34.0–46.6)
Hemoglobin: 11.8 g/dL (ref 11.1–15.9)
MCH: 30.6 pg (ref 26.6–33.0)
MCHC: 33.1 g/dL (ref 31.5–35.7)
MCV: 92 fL (ref 79–97)
PLATELETS: 301 10*3/uL (ref 150–379)
RBC: 3.86 x10E6/uL (ref 3.77–5.28)
RDW: 14.2 % (ref 12.3–15.4)
WBC: 7.8 10*3/uL (ref 3.4–10.8)

## 2016-02-07 LAB — T4 AND TSH
T4, Total: 9.6 ug/dL (ref 4.5–12.0)
TSH: 1.95 u[IU]/mL (ref 0.450–4.500)

## 2016-02-07 LAB — FOLLICLE STIMULATING HORMONE: FSH: 29.3 m[IU]/mL

## 2016-02-07 LAB — ESTROGENS, TOTAL: ESTROGEN: 426 pg/mL

## 2016-02-07 LAB — LUTEINIZING HORMONE: LH: 16 m[IU]/mL

## 2016-02-10 ENCOUNTER — Other Ambulatory Visit: Payer: Self-pay | Admitting: Family Medicine

## 2016-02-10 LAB — SPECIMEN STATUS REPORT

## 2016-02-10 LAB — PHENYTOIN LEVEL, TOTAL: Phenytoin (Dilantin), Serum: 0.8 ug/mL — ABNORMAL LOW (ref 10.0–20.0)

## 2016-02-12 ENCOUNTER — Telehealth: Payer: Self-pay

## 2016-02-12 MED ORDER — PHENYTOIN SODIUM EXTENDED 100 MG PO CAPS: 100.0000 mg | ORAL_CAPSULE | Freq: Two times a day (BID) | ORAL | 2 refills | Status: DC

## 2016-02-12 NOTE — Telephone Encounter (Signed)
Advised patient of results. Medication was sent into the pharmacy.  

## 2016-02-12 NOTE — Telephone Encounter (Signed)
Left message to call back  

## 2016-02-12 NOTE — Telephone Encounter (Signed)
-----   Message from Birdie Sons, MD sent at 02/12/2016  9:32 AM EST ----- Dilantin level is very low, barely detectable. All other labs including all hormone levels are normal, consistent with being post menopause. Need to increase dilantin to 2 capsules daily. If running out can send in new rx for #180, rf x 2. Recheck dilantin levels 3-4 weeks.

## 2016-02-18 ENCOUNTER — Other Ambulatory Visit: Payer: Self-pay | Admitting: Family Medicine

## 2016-03-03 ENCOUNTER — Ambulatory Visit: Payer: Medicare Other | Admitting: Family Medicine

## 2016-03-05 ENCOUNTER — Other Ambulatory Visit: Payer: Self-pay | Admitting: Family Medicine

## 2016-03-05 MED ORDER — ESTRADIOL-NORETHINDRONE ACET 0.5-0.1 MG PO TABS: 1.0000 | ORAL_TABLET | Freq: Every day | ORAL | 10 refills | Status: DC

## 2016-03-05 NOTE — Progress Notes (Signed)
Change from Prempro due to formulary

## 2016-03-08 ENCOUNTER — Encounter: Payer: Self-pay | Admitting: Family Medicine

## 2016-03-08 ENCOUNTER — Other Ambulatory Visit: Payer: Self-pay | Admitting: Family Medicine

## 2016-03-08 ENCOUNTER — Ambulatory Visit (INDEPENDENT_AMBULATORY_CARE_PROVIDER_SITE_OTHER): Payer: Medicare Other | Admitting: Family Medicine

## 2016-03-08 VITALS — BP 122/88 | HR 84 | Temp 97.8°F | Resp 16 | Wt 281.0 lb

## 2016-03-08 DIAGNOSIS — F32A Depression, unspecified: Secondary | ICD-10-CM

## 2016-03-08 DIAGNOSIS — F329 Major depressive disorder, single episode, unspecified: Secondary | ICD-10-CM | POA: Diagnosis not present

## 2016-03-08 DIAGNOSIS — G40909 Epilepsy, unspecified, not intractable, without status epilepticus: Secondary | ICD-10-CM

## 2016-03-08 MED ORDER — AMITRIPTYLINE HCL 25 MG PO TABS: 25.0000 mg | ORAL_TABLET | Freq: Every day | ORAL | 1 refills | Status: DC

## 2016-03-08 NOTE — Progress Notes (Signed)
Patient: Sharon Bryan Female    DOB: 1965/06/29   51 y.o.   MRN: [id number] Visit Date: 03/08/2016  Today's Provider: Lelon Huh, MD   Chief Complaint  Patient presents with  . Follow-up   Subjective:    HPI Follow up of Seizure disorder:  Patient was last seen for this problem 1 months ago. Management during that visit includes checking labs which showed a very low Dilantin level. At 0.8.  Patient was advised to increase Dilantin to 2 capsules daily and recheck levels in 3-4 weeks. Today patient comes in reporting good compliance with treatment, and good tolerance. She and her husband state she has had no new seizure activity since last visit. Is tolerating change in dose, but has been more irritable. She was prescribed prescribed higher dose of lamotrigine by psychiatry which she felt was very effective at stabilizing her mood.     Allergies  Allergen Reactions  . Codeine     increased heart rate ;choking feeling     Current Outpatient Prescriptions:  .  acetaminophen (TYLENOL) 325 MG tablet, Take 1 tablet by mouth every 4 (four) hours as needed., Disp: , Rfl:  .  ALPRAZolam (XANAX) 1 MG tablet, Take 1 tablet (1 mg total) by mouth every 8 (eight) hours as needed., Disp: 90 tablet, Rfl: 5 .  amitriptyline (ELAVIL) 25 MG tablet, Take 1-2 tablets (25-50 mg total) by mouth at bedtime., Disp: 60 tablet, Rfl: 1 .  busPIRone (BUSPAR) 30 MG tablet, TAKE 1 TABLET BY MOUTH TWICE DAILY, Disp: 180 tablet, Rfl: 4 .  estradiol (ESTRACE) 1 MG tablet, TAKE 1 TABLET BY MOUTH EVERY DAY, Disp: 90 tablet, Rfl: 4 .  estrogen, conjugated,-medroxyprogesterone (PREMPRO) 0.625-2.5 MG tablet, Take 1 tablet by mouth daily., Disp: , Rfl:  .  fluticasone (FLONASE) 50 MCG/ACT nasal spray, USE 2 SPRAYS IN EACH NOSTRIL EVERY DAY, Disp: 48 g, Rfl: 3 .  lamoTRIgine (LAMICTAL) 100 MG tablet, TAKE 1 TABLET BY MOUTH TWICE DAILY, Disp: 180 tablet, Rfl: 3 .  LYRICA 225 MG capsule, TAKE ONE CAPSULE BY  MOUTH TWICE DAILY, Disp: 60 capsule, Rfl: 5 .  Magnesium Oxide 420 MG TABS, Take 400 mg by mouth daily., Disp: , Rfl:  .  meloxicam (MOBIC) 15 MG tablet, TAKE 1 TABLET BY MOUTH EVERY DAY AS NEEDED FOR PAIN, Disp: 90 tablet, Rfl: 1 .  neomycin-polymyxin-hydrocortisone (CORTISPORIN) 3.5-10000-1 otic suspension, Place 4 drops into both ears 4 (four) times daily., Disp: 10 mL, Rfl: 1 .  omeprazole (PRILOSEC) 20 MG capsule, TAKE 1 CAPSULE BY MOUTH ONCE DAILY, Disp: 30 capsule, Rfl: 12 .  PARoxetine (PAXIL) 40 MG tablet, TAKE 1 TABLET BY MOUTH EVERY DAY, Disp: 30 tablet, Rfl: 12 .  phenytoin (DILANTIN) 100 MG ER capsule, Take 1 capsule (100 mg total) by mouth 2 (two) times daily., Disp: 180 capsule, Rfl: 2 .  urea (CARMOL) 20 % cream, APPLY TO THE AFFECTED AREA TWICE DAILY AS NEEDED, Disp: 85 g, Rfl: 3  Review of Systems  Constitutional: Negative for appetite change, chills, fatigue and fever.  Respiratory: Negative for chest tightness and shortness of breath.   Cardiovascular: Negative for chest pain and palpitations.  Gastrointestinal: Negative for abdominal pain, nausea and vomiting.  Musculoskeletal: Positive for back pain and myalgias.  Neurological: Negative for dizziness and weakness.    Social History  Substance Use Topics  . Smoking status: Former Smoker    Quit date: 01/25/2007  . Smokeless tobacco: Never Used  Comment: Smoked for about 15 years, smoked about 1 pack a day.  . Alcohol use No   Objective:   BP 122/88 (BP Location: Left Arm, Patient Position: Sitting, Cuff Size: Large)   Pulse 84   Temp 97.8 F (36.6 C) (Oral)   Resp 16   Wt 281 lb (127.5 kg)   LMP 04/26/2002   SpO2 96% Comment: room air  BMI 51.40 kg/m   Physical Exam  General Appearance:    Alert, cooperative, no distress, obese  Eyes:    PERRL, conjunctiva/corneas clear, EOM's intact       Lungs:     Clear to auscultation bilaterally, respirations unlabored  Heart:    Regular rate and rhythm    Neurologic:   Awake, alert, oriented x 3. No apparent focal neurological           defect.          Assessment & Plan:     1. Seizure disorder ([place]) Controlled since increasing ose of phenytoin.  - Dilantin (Phenytoin) level, total  2. Depressive disorder A little more irritable since increasing phenytoin. She feels she did better with higher dose of lamotrigine in the past. Consider going back up to 150 bid.        Lelon Huh, MD  [facility]

## 2016-03-09 LAB — PHENYTOIN LEVEL, TOTAL: PHENYTOIN (DILANTIN), SERUM: 2.1 ug/mL — AB (ref 10.0–20.0)

## 2016-03-10 ENCOUNTER — Encounter: Payer: Self-pay | Admitting: Family Medicine

## 2016-03-10 ENCOUNTER — Telehealth: Payer: Self-pay

## 2016-03-10 NOTE — Telephone Encounter (Signed)
I agree. I thinks it would be better to stay on the current, lower dose of lamictal

## 2016-03-10 NOTE — Telephone Encounter (Signed)
Advised pt about dilantin levels. Pt is concerned about about increasing Lamictal. States an increased dose of lamictal could cause dilantin levels to decrease again. Do you still agree to increase dose? Renaldo Fiddler, CMA

## 2016-03-11 NOTE — Telephone Encounter (Signed)
Patient has been advised. KW 

## 2016-03-29 ENCOUNTER — Telehealth: Payer: Self-pay | Admitting: Family Medicine

## 2016-03-29 NOTE — Telephone Encounter (Signed)
Called Pt to schedule AWV with [facility] - knb °

## 2016-04-22 ENCOUNTER — Telehealth: Payer: Self-pay | Admitting: Family Medicine

## 2016-04-22 NOTE — Telephone Encounter (Signed)
Called Pt to schedule AWV with [facility] - knb °

## 2016-05-01 ENCOUNTER — Other Ambulatory Visit: Payer: Self-pay | Admitting: Family Medicine

## 2016-05-01 NOTE — Telephone Encounter (Signed)
Please call in Lyrica

## 2016-05-02 ENCOUNTER — Telehealth: Payer: Self-pay | Admitting: Family Medicine

## 2016-05-02 NOTE — Telephone Encounter (Signed)
PA on prempro was done last week. Will call and check on the status.

## 2016-05-02 NOTE — Telephone Encounter (Signed)
Please call in Lyrica

## 2016-05-02 NOTE — Telephone Encounter (Signed)
Pt called saying she has not been able to get her hormone rx for over a month.  The pharmacy said her insurance will not cover.  They were seeding or calling for an authorization but haven't heard anything   She uses [facility] in [place].   Thanks  Con Memos

## 2016-05-03 ENCOUNTER — Other Ambulatory Visit: Payer: Self-pay | Admitting: Family Medicine

## 2016-05-03 NOTE — Telephone Encounter (Signed)
Rx called in to pharmacy. 

## 2016-05-04 ENCOUNTER — Encounter: Payer: Self-pay | Admitting: Family Medicine

## 2016-05-04 ENCOUNTER — Ambulatory Visit (INDEPENDENT_AMBULATORY_CARE_PROVIDER_SITE_OTHER): Payer: Medicare Other | Admitting: Family Medicine

## 2016-05-04 VITALS — BP 120/72 | HR 88 | Temp 97.9°F | Resp 16 | Wt 281.0 lb

## 2016-05-04 DIAGNOSIS — N951 Menopausal and female climacteric states: Secondary | ICD-10-CM

## 2016-05-04 DIAGNOSIS — N644 Mastodynia: Secondary | ICD-10-CM

## 2016-05-04 DIAGNOSIS — G40909 Epilepsy, unspecified, not intractable, without status epilepticus: Secondary | ICD-10-CM

## 2016-05-04 MED ORDER — ESTRADIOL-NORETHINDRONE ACET 0.5-0.1 MG PO TABS: 1.0000 | ORAL_TABLET | Freq: Every day | ORAL | 11 refills | Status: DC

## 2016-05-04 NOTE — Progress Notes (Signed)
Patient: Sharon Bryan Female    DOB: 1965/07/03   51 y.o.   MRN: [id number] Visit Date: 05/04/2016  Today's Provider: Lelon Huh, MD   Chief Complaint  Patient presents with  . Breast Pain    x 1 month   Subjective:    HPI Left breast pain:  Patient comes in reporting soreness in her left breast for the past 2 weeks. Patient states the pain worsens upon palpation. Patient denies any nipple discharge or mass. Patient reports her sister has a history pf breast cancer. Patient has never had a screening mammogram. She does have a sister with breast cancer    Allergies  Allergen Reactions  . Codeine     increased heart rate ;choking feeling     Current Outpatient Prescriptions:  .  acetaminophen (TYLENOL) 325 MG tablet, Take 1 tablet by mouth every 4 (four) hours as needed., Disp: , Rfl:  .  ALPRAZolam (XANAX) 1 MG tablet, Take 1 tablet (1 mg total) by mouth every 8 (eight) hours as needed., Disp: 90 tablet, Rfl: 5 .  amitriptyline (ELAVIL) 25 MG tablet, TAKE 1 TO 2 TABLETS(25 TO 50 MG) BY MOUTH AT BEDTIME, Disp: 60 tablet, Rfl: 5 .  busPIRone (BUSPAR) 30 MG tablet, TAKE 1 TABLET BY MOUTH TWICE DAILY, Disp: 180 tablet, Rfl: 4 .  estradiol (ESTRACE) 1 MG tablet, TAKE 1 TABLET BY MOUTH EVERY DAY, Disp: 90 tablet, Rfl: 4 .  estrogen, conjugated,-medroxyprogesterone (PREMPRO) 0.625-2.5 MG tablet, Take 1 tablet by mouth daily., Disp: , Rfl:  .  fluticasone (FLONASE) 50 MCG/ACT nasal spray, USE 2 SPRAYS IN EACH NOSTRIL EVERY DAY, Disp: 48 g, Rfl: 3 .  lamoTRIgine (LAMICTAL) 100 MG tablet, TAKE 1 TABLET BY MOUTH TWICE DAILY, Disp: 180 tablet, Rfl: 3 .  LYRICA 225 MG capsule, TAKE ONE CAPSULE BY MOUTH TWICE DAILY, Disp: 60 capsule, Rfl: 5 .  Magnesium Oxide 420 MG TABS, Take 400 mg by mouth daily., Disp: , Rfl:  .  meloxicam (MOBIC) 15 MG tablet, TAKE 1 TABLET BY MOUTH EVERY DAY AS NEEDED FOR PAIN, Disp: 90 tablet, Rfl: 1 .  neomycin-polymyxin-hydrocortisone (CORTISPORIN)  3.5-10000-1 otic suspension, Place 4 drops into both ears 4 (four) times daily., Disp: 10 mL, Rfl: 1 .  omeprazole (PRILOSEC) 20 MG capsule, TAKE 1 CAPSULE BY MOUTH ONCE DAILY, Disp: 30 capsule, Rfl: 12 .  PARoxetine (PAXIL) 40 MG tablet, TAKE 1 TABLET BY MOUTH EVERY DAY, Disp: 30 tablet, Rfl: 12 .  phenytoin (DILANTIN) 100 MG ER capsule, Take 1 capsule (100 mg total) by mouth 2 (two) times daily., Disp: 180 capsule, Rfl: 2 .  urea (CARMOL) 20 % cream, APPLY TO THE AFFECTED AREA TWICE DAILY AS NEEDED, Disp: 85 g, Rfl: 3  Review of Systems  Constitutional: Negative for chills, fatigue and fever.  HENT: Negative for congestion, ear pain, rhinorrhea, sneezing and sore throat.   Eyes: Negative.  Negative for pain and redness.  Respiratory: Negative for cough, shortness of breath and wheezing.   Cardiovascular: Negative for chest pain and leg swelling.  Gastrointestinal: Negative for abdominal pain, blood in stool, constipation, diarrhea and nausea.  Endocrine: Negative for polydipsia and polyphagia.  Genitourinary: Negative.  Negative for dysuria, flank pain, hematuria, pelvic pain, vaginal bleeding and vaginal discharge.  Musculoskeletal: Negative for arthralgias, back pain, gait problem and joint swelling.       Left breast pain  Skin: Negative for rash.  Neurological: Negative.  Negative for dizziness, tremors, seizures,  weakness, light-headedness, numbness and headaches.  Hematological: Negative for adenopathy.  Psychiatric/Behavioral: Negative.  Negative for behavioral problems, confusion and dysphoric mood. The patient is not nervous/anxious and is not hyperactive.     Social History  Substance Use Topics  . Smoking status: Former Smoker    Quit date: 01/25/2007  . Smokeless tobacco: Never Used     Comment: Smoked for about 15 years, smoked about 1 pack a day.  . Alcohol use No   Objective:   BP 120/72 (BP Location: Left Arm, Patient Position: Sitting, Cuff Size: Large)   Pulse 88    Temp 97.9 F (36.6 C) (Oral)   Resp 16   Wt 281 lb (127.5 kg)   LMP 04/26/2002   SpO2 97% Comment: room  air  BMI 51.40 kg/m  Vitals:   05/04/16 1637  Resp: 16  Weight: 281 lb (127.5 kg)     Physical Exam   General Appearance:    Alert, cooperative, no distress  Eyes:    PERRL, conjunctiva/corneas clear, EOM's intact       Breast:   Moderate tenderness to deep palpation of left medial breast at about 4 o'clock, 4cm from areola.        Assessment & Plan:      1. Mastalgia  - MM DIAG BREAST TOMO UNI LEFT; Future - MM Digital Screening Unilat R; Future - US BREAST COMPLETE UNI LEFT INC AXILLA; Future  2. Seizure disorder [facility]) Much better since increasing dose of Dilantin in January  3. Menopausal symptoms Received letter from insurance the Prempro is no longer covered. Will change to - Estradiol-Norethindrone Acet 0.5-0.1 MG tablet; Take 1 tablet by mouth daily.  Dispense: 28 tablet; Refill: [place], MD  [facility]

## 2016-05-05 ENCOUNTER — Other Ambulatory Visit: Payer: Self-pay | Admitting: *Deleted

## 2016-05-05 DIAGNOSIS — N644 Mastodynia: Secondary | ICD-10-CM

## 2016-05-05 NOTE — Addendum Note (Signed)
Addended by: Julieta Bellini on: 05/05/2016 09:29 AM   Modules accepted: Orders

## 2016-05-05 NOTE — Telephone Encounter (Signed)
Please see note.

## 2016-05-05 NOTE — Telephone Encounter (Signed)
Pt's boyfriend called saying the last hormone medication laproza is not covered by insurance.  The pharmacy suggested  estradiol (ESTRACE) 1 MG tablet  Taking 07/26/15 -- Birdie Sons, MD    TAKE 1 TABLET BY MOUTH EVERY    Please check with pharmacy.  [facility] in [place], [address] [place] [employer]

## 2016-05-06 ENCOUNTER — Other Ambulatory Visit: Payer: Self-pay | Admitting: Family Medicine

## 2016-05-06 MED ORDER — ESTRADIOL 1 MG PO TABS: 1.0000 mg | ORAL_TABLET | Freq: Every day | ORAL | 4 refills | Status: DC

## 2016-05-06 NOTE — Telephone Encounter (Signed)
Rx canceled

## 2016-05-06 NOTE — Progress Notes (Signed)
Have sent new prescription for estrace. Please call and cancel prescription for  Original Order:  Estradiol-Norethindrone Acet 0.5-0.1 MG tablet [[id number]]    Pharmacy:  [facility] [place], [place] -

## 2016-05-06 NOTE — Telephone Encounter (Signed)
Have sent new prescription for estrace. Please call and cancel prescription for  Original Order:  Estradiol-Norethindrone Acet 0.5-0.1 MG tablet [[id number]]    Pharmacy:  [facility] [place], [place] -

## 2016-05-13 ENCOUNTER — Other Ambulatory Visit: Payer: Medicare Other

## 2016-05-13 ENCOUNTER — Other Ambulatory Visit: Payer: Self-pay | Admitting: Family Medicine

## 2016-05-13 ENCOUNTER — Inpatient Hospital Stay: Admission: RE | Admit: 2016-05-13 | Payer: Medicare Other | Source: Ambulatory Visit

## 2016-05-13 NOTE — Telephone Encounter (Signed)
Please call in alprazolam.  

## 2016-05-13 NOTE — Telephone Encounter (Signed)
rx called in-aa 

## 2016-06-23 ENCOUNTER — Other Ambulatory Visit: Payer: Self-pay | Admitting: Family Medicine

## 2016-06-23 ENCOUNTER — Telehealth: Payer: Self-pay | Admitting: Family Medicine

## 2016-06-24 NOTE — Telephone Encounter (Signed)
Please call in alprazolam.  

## 2016-06-24 NOTE — Telephone Encounter (Signed)
rx called in-aa 

## 2016-06-29 ENCOUNTER — Other Ambulatory Visit: Payer: Self-pay | Admitting: Family Medicine

## 2016-08-08 ENCOUNTER — Telehealth: Payer: Self-pay | Admitting: Family Medicine

## 2016-08-08 NOTE — Telephone Encounter (Signed)
Left message about need to schedule AWV °

## 2016-09-08 ENCOUNTER — Other Ambulatory Visit: Payer: Self-pay | Admitting: Family Medicine

## 2016-10-20 ENCOUNTER — Ambulatory Visit: Payer: Medicare Other

## 2016-10-27 ENCOUNTER — Telehealth: Payer: Self-pay | Admitting: Family Medicine

## 2016-10-29 ENCOUNTER — Other Ambulatory Visit: Payer: Self-pay | Admitting: Family Medicine

## 2016-10-30 ENCOUNTER — Other Ambulatory Visit: Payer: Self-pay | Admitting: Family Medicine

## 2016-10-30 NOTE — Telephone Encounter (Signed)
Please call in Lyrica

## 2016-10-31 NOTE — Telephone Encounter (Signed)
Rx called in to pharmacy. 

## 2016-11-24 NOTE — Telephone Encounter (Signed)
LMTCB and r/s AWV.  -MM

## 2016-11-30 NOTE — Progress Notes (Signed)
Patient: Sharon Bryan Female    DOB: 08-26-1965   51 y.o.   MRN: [id number] Visit Date: 11/30/2016  Today's Provider: Lelon Huh, MD   Chief Complaint  Patient presents with  . Headache    x 1 month   Subjective:   Headaches:  Patient presents today complaining of constant headaches starting about a month ago. Headaches are usually worse on the right. They are pounding in nature. They wax an wane but never seem to resolve. They are associated with nausea and a few episodes of vomiting. Often feels dizzy when headaches are worse. No numbness or weakness of extremities   She states that yesterday she had a  Headache and went to lay down in her bed. After lying down for a few minutes, she got up and noticed that the whites of her right eye had turned blood red. Eye has been slightly sore, no photophobia. Has had no trauma. Has not been coughing or sneezing.   She has also had nausea, abdominal cramps and dizziness. Patient states these symptoms started after taking fiber tablets. She has also had blurred vision..   Past Medical History:  Diagnosis Date  . Allergy   . Anemia, iron deficiency 09/02/2008  . 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 Bell's palsy   . Fibromyalgia    (worsening)  . Menopause 2015  . Migraine   . Numbness and tingling of right arm and leg   . Sciatica 08/04/2014  . Thyroid cyst 10/27/2010   Past Surgical History:  Procedure Laterality Date  . ABDOMINAL HYSTERECTOMY  2004   partial, ovaries remain  . CESAREAN SECTION  1998  . CRANIOTOMY  2005   for artetiovenius malformation  . TUBAL LIGATION  1998     Allergies  Allergen Reactions  . Codeine     increased heart rate ;choking feeling     Current Outpatient Medications:  .  ALPRAZolam (XANAX) 1 MG tablet, TAKE 1/2 TO 1 TABLET BY MOUTH EVERY 8 HOURS AS NEEDED, Disp: 90 tablet, Rfl: 5 .  amitriptyline (ELAVIL) 25 MG tablet, TAKE 1 TO 2 TABLETS(25 TO 50 MG) BY  MOUTH AT BEDTIME, Disp: 60 tablet, Rfl: 5 .  Aspirin-Acetaminophen-Caffeine (EXCEDRIN PO), Take 1 tablet daily as needed by mouth., Disp: , Rfl:  .  busPIRone (BUSPAR) 30 MG tablet, TAKE 1 TABLET BY MOUTH TWICE DAILY, Disp: 180 tablet, Rfl: 4 .  estradiol (ESTRACE) 1 MG tablet, Take 1 tablet (1 mg total) by mouth daily., Disp: 90 tablet, Rfl: 4 .  lamoTRIgine (LAMICTAL) 100 MG tablet, TAKE 1 TABLET BY MOUTH TWICE DAILY, Disp: 180 tablet, Rfl: 3 .  LYRICA 225 MG capsule, TAKE 1 CAPSULE BY MOUTH TWICE DAILY, Disp: 60 capsule, Rfl: 5 .  Magnesium Oxide 420 MG TABS, Take 400 mg by mouth daily., Disp: , Rfl:  .  meloxicam (MOBIC) 15 MG tablet, TAKE 1 TABLET BY MOUTH EVERY DAY AS NEEDED FOR PAIN, Disp: 90 tablet, Rfl: 3 .  omeprazole (PRILOSEC) 20 MG capsule, TAKE 1 CAPSULE BY MOUTH ONCE DAILY, Disp: 30 capsule, Rfl: 12 .  PARoxetine (PAXIL) 40 MG tablet, TAKE 1 TABLET BY MOUTH EVERY DAY, Disp: 30 tablet, Rfl: 12 .  phenytoin (DILANTIN) 100 MG ER capsule, Take 1 capsule (100 mg total) by mouth 2 (two) times daily., Disp: 180 capsule, Rfl: 2  Review of Systems  Constitutional: Negative for appetite change, chills, fatigue and fever.  Eyes: Positive for pain, redness and  visual disturbance.  Respiratory: Negative for chest tightness and shortness of breath.   Cardiovascular: Negative for chest pain and palpitations.  Gastrointestinal: Positive for nausea and vomiting. Negative for abdominal pain.  Neurological: Positive for dizziness and headaches. Negative for weakness.    Social History   Tobacco Use  . Smoking status: Former Smoker    Last attempt to quit: 01/25/2007    Years since quitting: 9.8  . Smokeless tobacco: Never Used  . Tobacco comment: Smoked for about 15 years, smoked about 1 pack a day.  Substance Use Topics  . Alcohol use: No   Objective:   BP 122/90 (BP Location: Left Arm, Patient Position: Sitting, Cuff Size: Large)   Pulse 89   Temp 98.5 F (36.9 C) (Oral)   Resp 18    Wt 276 lb (125.2 kg)   LMP 04/26/2002 (LMP Unknown)   SpO2 95% Comment: room air  BMI 50.48 kg/m  Vitals:   12/01/16 1129  BP: 122/90  Pulse: 89  Resp: 18  Temp: 98.5 F (36.9 C)  TempSrc: Oral  SpO2: 95%  Weight: 276 lb (125.2 kg)     Physical Exam  General Appearance:    Alert, cooperative, no distress  HENT:   ENT exam normal, no neck nodes or sinus tenderness  Eyes:    PERRL,  EOM's intact. Subconjunctival hemorrhage noted right eye.   Lungs:     Clear to auscultation bilaterally, respirations unlabored  Heart:    Regular rate and rhythm  Neurologic:   Awake, alert, oriented x 3. No apparent focal neurological           defect.           Assessment & Plan:     1. Subconjunctival hematoma, right No sign of trauma. Counseled on self limiting nature of condition. Call if any increase in pain or discomfort or if any visual chages.   2. Non-intractable vomiting with nausea, unspecified vomiting type  - ondansetron (ZOFRAN) 4 MG tablet; Take 1 tablet (4 mg total) every 8 (eight) hours as needed by mouth for nausea or vomiting.  Dispense: 20 tablet; Refill: 0  3. Intractable episodic headache, unspecified headache type Considering associated nausea, dizziness and history of neurosurgery for AVM, neuroimaging is indicated, will schedule for head CT.   4. Need for influenza vaccination  - Flu Vaccine QUAD 6+ mos PF IM (Fluarix Quad PF)  5. AVM (arteriovenous malformation) brain        Lelon Huh, MD  [facility]

## 2016-12-01 ENCOUNTER — Encounter: Payer: Self-pay | Admitting: Family Medicine

## 2016-12-01 ENCOUNTER — Ambulatory Visit (INDEPENDENT_AMBULATORY_CARE_PROVIDER_SITE_OTHER): Payer: Medicare Other | Admitting: Family Medicine

## 2016-12-01 VITALS — BP 118/78 | HR 89 | Temp 98.5°F | Resp 18 | Wt 276.0 lb

## 2016-12-01 DIAGNOSIS — R51 Headache: Secondary | ICD-10-CM | POA: Diagnosis not present

## 2016-12-01 DIAGNOSIS — R112 Nausea with vomiting, unspecified: Secondary | ICD-10-CM | POA: Diagnosis not present

## 2016-12-01 DIAGNOSIS — Z23 Encounter for immunization: Secondary | ICD-10-CM

## 2016-12-01 DIAGNOSIS — R519 Headache, unspecified: Secondary | ICD-10-CM

## 2016-12-01 DIAGNOSIS — Q282 Arteriovenous malformation of cerebral vessels: Secondary | ICD-10-CM | POA: Diagnosis not present

## 2016-12-01 DIAGNOSIS — H1131 Conjunctival hemorrhage, right eye: Secondary | ICD-10-CM

## 2016-12-01 MED ORDER — ONDANSETRON HCL 4 MG PO TABS: 4.0000 mg | ORAL_TABLET | Freq: Three times a day (TID) | ORAL | 0 refills | Status: DC | PRN

## 2016-12-05 ENCOUNTER — Ambulatory Visit: Payer: Medicare Other | Admitting: Family Medicine

## 2016-12-08 ENCOUNTER — Ambulatory Visit
Admission: RE | Admit: 2016-12-08 | Discharge: 2016-12-08 | Disposition: A | Discharge status: Home / Self Care | Payer: Medicare Other | Source: Ambulatory Visit | Attending: Family Medicine | Admitting: Family Medicine

## 2016-12-08 DIAGNOSIS — R112 Nausea with vomiting, unspecified: Secondary | ICD-10-CM | POA: Diagnosis not present

## 2016-12-08 DIAGNOSIS — G9389 Other specified disorders of brain: Secondary | ICD-10-CM | POA: Diagnosis not present

## 2016-12-08 DIAGNOSIS — R51 Headache: Secondary | ICD-10-CM | POA: Diagnosis not present

## 2016-12-08 DIAGNOSIS — Q282 Arteriovenous malformation of cerebral vessels: Secondary | ICD-10-CM

## 2016-12-08 DIAGNOSIS — R519 Headache, unspecified: Secondary | ICD-10-CM

## 2016-12-08 MED ORDER — IOPAMIDOL (ISOVUE-300) INJECTION 61%
75.0000 mL | Freq: Once | INTRAVENOUS | Status: AC | PRN
  Administered 2016-12-08: 75 mL via INTRAVENOUS

## 2016-12-17 ENCOUNTER — Other Ambulatory Visit: Payer: Self-pay | Admitting: Family Medicine

## 2016-12-28 ENCOUNTER — Other Ambulatory Visit: Payer: Self-pay | Admitting: Family Medicine

## 2016-12-31 ENCOUNTER — Other Ambulatory Visit: Payer: Self-pay | Admitting: Family Medicine

## 2017-01-12 NOTE — Telephone Encounter (Signed)
duplicate

## 2017-01-14 ENCOUNTER — Other Ambulatory Visit: Payer: Self-pay | Admitting: Family Medicine

## 2017-02-01 ENCOUNTER — Other Ambulatory Visit: Payer: Self-pay | Admitting: Family Medicine

## 2017-02-23 ENCOUNTER — Other Ambulatory Visit: Payer: Self-pay | Admitting: Family Medicine

## 2017-02-25 ENCOUNTER — Other Ambulatory Visit: Payer: Self-pay | Admitting: Family Medicine

## 2017-02-27 NOTE — Telephone Encounter (Signed)
Pharmacy requesting refills. ThankS!

## 2017-03-01 ENCOUNTER — Ambulatory Visit: Payer: Medicare Other

## 2017-03-01 ENCOUNTER — Encounter: Payer: Medicare Other | Admitting: Family Medicine

## 2017-03-01 NOTE — Progress Notes (Deleted)
       Patient: Sharon Bryan Female    DOB: 1965/12/30   52 y.o.   MRN: [id number] Visit Date: 03/01/2017  Today's Provider: Lelon Huh, MD   No chief complaint on file.  Subjective:     Patient saw [facility] today at 1:15 for AWV.  HPI  Seizure disorder ([place]) From 05/04/2016-no changes.   Menopausal symptoms From 05/04/2016-changed to Estradiol-Norethindrone Acet 0.5-0.1 MG tablet.  Depressive disorder From 03/08/2016-increased lamotrigine back up to 150 bid.   Fibromyalgia From 02/04/2016-no changes.    Allergies  Allergen Reactions  . Codeine     increased heart rate ;choking feeling     Current Outpatient Medications:  .  ALPRAZolam (XANAX) 1 MG tablet, TAKE A 1/2 TO 1 TABLET BY MOUTH EVERY 8 HOURS AS NEEDED, Disp: 90 tablet, Rfl: 3 .  amitriptyline (ELAVIL) 25 MG tablet, TAKE 1 TO 2 TABLETS(25 TO 50 MG) BY MOUTH AT BEDTIME, Disp: 60 tablet, Rfl: 5 .  Aspirin-Acetaminophen-Caffeine (EXCEDRIN PO), Take 1 tablet daily as needed by mouth., Disp: , Rfl:  .  busPIRone (BUSPAR) 30 MG tablet, TAKE 1 TABLET BY MOUTH TWICE DAILY, Disp: 180 tablet, Rfl: 4 .  estradiol (ESTRACE) 1 MG tablet, Take 1 tablet (1 mg total) by mouth daily., Disp: 90 tablet, Rfl: 4 .  lamoTRIgine (LAMICTAL) 100 MG tablet, TAKE 1 TABLET BY MOUTH TWICE DAILY, Disp: 180 tablet, Rfl: 4 .  LYRICA 225 MG capsule, TAKE 1 CAPSULE BY MOUTH TWICE DAILY, Disp: 60 capsule, Rfl: 5 .  Magnesium Oxide 420 MG TABS, Take 400 mg by mouth daily., Disp: , Rfl:  .  meloxicam (MOBIC) 15 MG tablet, TAKE 1 TABLET BY MOUTH EVERY DAY AS NEEDED FOR PAIN, Disp: 90 tablet, Rfl: 3 .  omeprazole (PRILOSEC) 20 MG capsule, TAKE 1 CAPSULE BY MOUTH ONCE DAILY, Disp: 30 capsule, Rfl: 12 .  ondansetron (ZOFRAN) 4 MG tablet, Take 1 tablet (4 mg total) every 8 (eight) hours as needed by mouth for nausea or vomiting., Disp: 20 tablet, Rfl: 0 .  PARoxetine (PAXIL) 40 MG tablet, TAKE 1 TABLET BY MOUTH EVERY DAY, Disp: 30 tablet, Rfl:  11 .  phenytoin (DILANTIN) 100 MG ER capsule, TAKE 1 CAPSULE(100 MG) BY MOUTH TWICE DAILY, Disp: 180 capsule, Rfl: 5 .  PREMPRO 0.625-2.5 MG tablet, TAKE 1 TABLET BY MOUTH DAILY, Disp: 28 tablet, Rfl: 1  Review of Systems  Constitutional: Negative for appetite change, chills, fatigue and fever.  Respiratory: Negative for chest tightness and shortness of breath.   Cardiovascular: Negative for chest pain and palpitations.  Gastrointestinal: Negative for abdominal pain, nausea and vomiting.  Neurological: Negative for dizziness and weakness.    Social History   Tobacco Use  . Smoking status: Former Smoker    Last attempt to quit: 01/25/2007    Years since quitting: 10.1  . Smokeless tobacco: Never Used  . Tobacco comment: Smoked for about 15 years, smoked about 1 pack a day.  Substance Use Topics  . Alcohol use: Yes    Comment: occasional use; 1-2 times a year   Objective:   LMP 04/26/2002 (LMP Unknown)  There were no vitals filed for this visit.   Physical Exam      Assessment & Plan:           Lelon Huh, MD  [facility]

## 2017-03-03 ENCOUNTER — Telehealth: Payer: Self-pay

## 2017-03-03 NOTE — Telephone Encounter (Signed)
Pt requesting medication Prempro 0.625-2.5 MG [facility] in [place] sent request on 02/23/17

## 2017-03-03 NOTE — Telephone Encounter (Signed)
Called pt to reschedule AWV. Pt states she is going out of town for a couple weeks. Pt would like a CB in March to reschedule.  -MM

## 2017-03-03 NOTE — Telephone Encounter (Signed)
Pt's fiance called for status up on refill request for PREMPRO 0.625-2.5 MG tablet. The pharmacy first requested refill on 02/23/17. Please advise. Thanks TNP

## 2017-03-06 ENCOUNTER — Other Ambulatory Visit: Payer: Self-pay | Admitting: Family Medicine

## 2017-03-06 MED ORDER — CONJ ESTROG-MEDROXYPROGEST ACE 0.625-2.5 MG PO TABS: 1.0000 | ORAL_TABLET | Freq: Every day | ORAL | 1 refills | Status: DC

## 2017-03-06 NOTE — Telephone Encounter (Signed)
[facility] in [place] is calling stating that they have sent multiple attempts for a refill on this medication and have not heard back.  Please advise.

## 2017-03-06 NOTE — Telephone Encounter (Signed)
[facility] faxed a refill request for the following medication. Thanks CC  PREMPRO 0.625-2.5 MG tablet

## 2017-03-13 ENCOUNTER — Other Ambulatory Visit: Payer: Self-pay | Admitting: Family Medicine

## 2017-03-28 NOTE — Telephone Encounter (Signed)
LMTCB and schedule AWV.  -MM 

## 2017-04-10 ENCOUNTER — Telehealth: Payer: Self-pay | Admitting: Family Medicine

## 2017-04-10 NOTE — Telephone Encounter (Signed)
Patient states that the pharmacy says that she needs a PA on Prempro.  She wants to check to see if we have received something about this and if so she would like to check on the status of this.

## 2017-04-10 NOTE — Telephone Encounter (Signed)
Received Fax from the pharmacy. Will start today.

## 2017-04-12 ENCOUNTER — Telehealth: Payer: Self-pay

## 2017-04-12 NOTE — Telephone Encounter (Signed)
PA still pending.  

## 2017-04-12 NOTE — Telephone Encounter (Signed)
See other message. Patient's PA is still pending.

## 2017-04-12 NOTE — Telephone Encounter (Signed)
Patient's having Mr Duaine Dredge is calling asking what is the update on his wife PA? And his PA? Please Advise.

## 2017-04-14 NOTE — Telephone Encounter (Signed)
Prempro was approved.

## 2017-04-18 ENCOUNTER — Telehealth: Payer: Self-pay

## 2017-04-18 NOTE — Telephone Encounter (Signed)
LMTCB and schedule AWV.  -MM 

## 2017-04-28 ENCOUNTER — Other Ambulatory Visit: Payer: Self-pay | Admitting: Family Medicine

## 2017-05-01 ENCOUNTER — Other Ambulatory Visit: Payer: Self-pay | Admitting: Family Medicine

## 2017-05-01 NOTE — Telephone Encounter (Signed)
I have tried to contact pt on 3 separate occasions and left VM to CB and set up AWV. Pt has not returned the VM. Closing encounter.  -MM

## 2017-05-10 ENCOUNTER — Other Ambulatory Visit: Payer: Self-pay | Admitting: Family Medicine

## 2017-05-18 ENCOUNTER — Telehealth: Payer: Self-pay

## 2017-05-18 MED ORDER — PHENYTOIN SODIUM EXTENDED 100 MG PO CAPS: 100.0000 mg | ORAL_CAPSULE | Freq: Two times a day (BID) | ORAL | 1 refills | Status: DC

## 2017-05-18 NOTE — Telephone Encounter (Signed)
It does appear that dose was previously 100mg  BID and levels were appropriate.  I sent new Rx to pharmacy.  Please let patient know.  Virginia Crews, MD, MPH [facility] 05/18/2017 2:11 PM

## 2017-05-18 NOTE — Telephone Encounter (Signed)
Patients husband states that patient has been on Dilantin for some time now and has always been on 100mg  ER BID, he states that Dr. Caryn Section sent in new prescription as her taking it only once a day. He wants to know why this was changed? Or if this was a an error on our behalf. Patients spouse would like this corrected and resent into pharmacy today. I informed him that Dr. Caryn Section is out of office the rest of week and will forwarded to another MD to review. KW

## 2017-05-19 NOTE — Telephone Encounter (Signed)
L/M advising below.  

## 2017-05-28 ENCOUNTER — Other Ambulatory Visit: Payer: Self-pay | Admitting: Family Medicine

## 2017-06-23 NOTE — Progress Notes (Deleted)
       Patient: Sharon Bryan Female    DOB: [birth year]   52 y.o.   MRN: [id number] Visit Date: 06/23/2017  Today's Provider: Lelon Huh, MD   No chief complaint on file.  Subjective:    HPI     Allergies  Allergen Reactions  . Codeine     increased heart rate ;choking feeling     Current Outpatient Medications:  .  ALPRAZolam (XANAX) 1 MG tablet, TAKE A 1/2 TO 1 TABLET BY MOUTH EVERY 8 HOURS AS NEEDED, Disp: 90 tablet, Rfl: 3 .  amitriptyline (ELAVIL) 25 MG tablet, TAKE 1 TO 2 TABLETS(25 TO 50 MG) BY MOUTH AT BEDTIME, Disp: 60 tablet, Rfl: 3 .  Aspirin-Acetaminophen-Caffeine (EXCEDRIN PO), Take 1 tablet daily as needed by mouth., Disp: , Rfl:  .  busPIRone (BUSPAR) 30 MG tablet, TAKE 1 TABLET BY MOUTH TWICE DAILY, Disp: 180 tablet, Rfl: 4 .  estradiol (ESTRACE) 1 MG tablet, Take 1 tablet (1 mg total) by mouth daily., Disp: 90 tablet, Rfl: 4 .  lamoTRIgine (LAMICTAL) 100 MG tablet, TAKE 1 TABLET BY MOUTH TWICE DAILY, Disp: 180 tablet, Rfl: 4 .  LYRICA 225 MG capsule, TAKE ONE CAPSULE BY MOUTH TWICE DAILY, Disp: 60 capsule, Rfl: 5 .  Magnesium Oxide 420 MG TABS, Take 400 mg by mouth daily., Disp: , Rfl:  .  meloxicam (MOBIC) 15 MG tablet, TAKE 1 TABLET BY MOUTH EVERY DAY AS NEEDED FOR PAIN, Disp: 90 tablet, Rfl: 3 .  omeprazole (PRILOSEC) 20 MG capsule, TAKE 1 CAPSULE BY MOUTH ONCE DAILY, Disp: 30 capsule, Rfl: 12 .  ondansetron (ZOFRAN) 4 MG tablet, Take 1 tablet (4 mg total) every 8 (eight) hours as needed by mouth for nausea or vomiting., Disp: 20 tablet, Rfl: 0 .  PARoxetine (PAXIL) 40 MG tablet, TAKE 1 TABLET BY MOUTH EVERY DAY, Disp: 30 tablet, Rfl: 11 .  phenytoin (DILANTIN) 100 MG ER capsule, Take 1 capsule (100 mg total) by mouth 2 (two) times daily., Disp: 180 capsule, Rfl: 1 .  PREMPRO 0.625-2.5 MG tablet, TAKE 1 TABLET BY MOUTH DAILY, Disp: 28 tablet, Rfl: 3  Review of Systems  Constitutional: Negative for appetite change, chills, fatigue and fever.    Respiratory: Negative for chest tightness and shortness of breath.   Cardiovascular: Negative for chest pain and palpitations.  Gastrointestinal: Negative for abdominal pain, nausea and vomiting.  Neurological: Negative for dizziness and weakness.    Social History   Tobacco Use  . Smoking status: Former Smoker    Last attempt to quit: 01/25/2007    Years since quitting: 10.4  . Smokeless tobacco: Never Used  . Tobacco comment: Smoked for about 15 years, smoked about 1 pack a day.  Substance Use Topics  . Alcohol use: Yes    Comment: occasional use; 1-2 times a year   Objective:   LMP 04/26/2002 (LMP Unknown)  There were no vitals filed for this visit.   Physical Exam      Assessment & Plan:           Lelon Huh, MD  [facility]

## 2017-06-26 ENCOUNTER — Ambulatory Visit: Payer: Medicare Other | Admitting: Family Medicine

## 2017-06-27 ENCOUNTER — Other Ambulatory Visit: Payer: Self-pay | Admitting: Family Medicine

## 2017-07-07 ENCOUNTER — Other Ambulatory Visit: Payer: Self-pay | Admitting: Family Medicine

## 2017-07-27 ENCOUNTER — Other Ambulatory Visit: Payer: Self-pay | Admitting: Family Medicine

## 2017-08-02 ENCOUNTER — Telehealth: Payer: Self-pay | Admitting: Family Medicine

## 2017-08-02 MED ORDER — NEOMYCIN-POLYMYXIN-HC 3.5-10000-1 OT SUSP: OTIC | 0 refills | Status: DC

## 2017-08-02 NOTE — Telephone Encounter (Signed)
Pt wants to know if she can get a refill on the ear drops that she uses for chronic ear pain, She is going town of town tomorrow and would like a refill before before she leaves.  She does not know the name of the drops. She uses them for pain that she has sometimes since she had brain surgery  She uses [facility]  Pt's call back is [phone]  Thanks Con Memos

## 2017-08-02 NOTE — Telephone Encounter (Signed)
I tried calling the pharmacy to get more information on the name of the last ear drops filled. Pharmacy has not record of patient getting ear drops within the last year. I called patient back and she restates that she doesn't know the name of this ear drops. She says it was for pain. I found this medication name in an office note from 2017. Patient could not verify that this was the correct medication.  NEOMYCIN-POLYMYXIN-HYDROCORTISONE (CORTISPORIN) 3.5-10000-1 OTIC SUSPENSION    Place 4 drops into both ears 4 (four) times daily

## 2017-08-18 ENCOUNTER — Other Ambulatory Visit: Payer: Self-pay | Admitting: Family Medicine

## 2017-09-13 ENCOUNTER — Other Ambulatory Visit: Payer: Self-pay | Admitting: Family Medicine

## 2017-09-29 ENCOUNTER — Other Ambulatory Visit: Payer: Self-pay | Admitting: Family Medicine

## 2017-10-03 ENCOUNTER — Other Ambulatory Visit: Payer: Self-pay | Admitting: Family Medicine

## 2017-10-27 ENCOUNTER — Other Ambulatory Visit: Payer: Self-pay | Admitting: Family Medicine

## 2017-10-27 NOTE — Telephone Encounter (Signed)
See refill request.

## 2017-10-27 NOTE — Telephone Encounter (Signed)
Pharmacy requesting refills. Thanks!  

## 2017-10-30 ENCOUNTER — Ambulatory Visit (INDEPENDENT_AMBULATORY_CARE_PROVIDER_SITE_OTHER): Payer: Medicare Other | Admitting: Family Medicine

## 2017-10-30 ENCOUNTER — Encounter: Payer: Self-pay | Admitting: Family Medicine

## 2017-10-30 VITALS — BP 130/76 | HR 96 | Temp 98.3°F | Wt 273.0 lb

## 2017-10-30 DIAGNOSIS — Z1211 Encounter for screening for malignant neoplasm of colon: Secondary | ICD-10-CM

## 2017-10-30 DIAGNOSIS — Z1239 Encounter for other screening for malignant neoplasm of breast: Secondary | ICD-10-CM | POA: Diagnosis not present

## 2017-10-30 DIAGNOSIS — R5383 Other fatigue: Secondary | ICD-10-CM | POA: Diagnosis not present

## 2017-10-30 DIAGNOSIS — Z136 Encounter for screening for cardiovascular disorders: Secondary | ICD-10-CM

## 2017-10-30 DIAGNOSIS — R102 Pelvic and perineal pain unspecified side: Secondary | ICD-10-CM

## 2017-10-30 DIAGNOSIS — R232 Flushing: Secondary | ICD-10-CM | POA: Diagnosis not present

## 2017-10-30 DIAGNOSIS — D509 Iron deficiency anemia, unspecified: Secondary | ICD-10-CM | POA: Diagnosis not present

## 2017-10-30 DIAGNOSIS — G40909 Epilepsy, unspecified, not intractable, without status epilepticus: Secondary | ICD-10-CM

## 2017-10-30 DIAGNOSIS — M797 Fibromyalgia: Secondary | ICD-10-CM

## 2017-10-30 MED ORDER — ESTRADIOL 2 MG PO TABS: 2.0000 mg | ORAL_TABLET | Freq: Every day | ORAL | 3 refills | Status: DC

## 2017-10-30 MED ORDER — LYRICA 225 MG PO CAPS: 225.0000 mg | ORAL_CAPSULE | Freq: Two times a day (BID) | ORAL | 4 refills | Status: DC

## 2017-10-30 NOTE — Patient Instructions (Addendum)
. .  Please call the [facility] [phone]) to schedule a routine screening mammogram.   Try OTC Probiotic such as Align, once every day   Stop Prempro and start higher dose of estradiol, 2mg  once a day

## 2017-10-30 NOTE — Progress Notes (Signed)
Patient: Sharon Bryan Female    DOB: 01-Sep-1965   52 y.o.   MRN: [id number] Visit Date: 10/30/2017  Today's Provider: Lelon Huh, MD   Chief Complaint  Patient presents with  . Seizures  . Depression   Subjective:    Seizures   This is a chronic problem. The problem has not changed (Pt reports it has been more the six months since her last seizure. ) since onset.Pertinent negatives include no confusion and no headaches.  Depression         This is a chronic problem.The problem is unchanged.  Associated symptoms include decreased concentration, fatigue, decreased interest (Especially in the last month.), body aches and myalgias.  Associated symptoms include no helplessness, no hopelessness, does not have insomnia, not irritable, no restlessness, no appetite change, no headaches, no indigestion, not sad and no suicidal ideas.  Fibromyalgia   Pt has recently changed to the generic Lyrica 225mg .  She states since the change her Fibromyalgia is not in good control.  She is having "More bad days then good".  She states she noticed fibromyalgia getting much worse after her Lyrica was changed to generic.    Wt Readings from Last 5 Encounters:  10/30/17 273 lb (123.8 kg)  12/01/16 276 lb (125.2 kg)  05/04/16 281 lb (127.5 kg)  03/08/16 281 lb (127.5 kg)  02/04/16 277 lb (125.6 kg)   Menopausal symptoms.  Her husband states she has hot flashes all the time. Patient states she does get hot and sweats easily, but feels she is just hot natured. She is taking her HRT daily. She requested prempro at previous appointment, although I didn't realized at the time she had already had a hysterectomy. She states she has not seen gyn for several years and requests referral for gyn and breast exam. She has been having some around left side of back and into left pelvic area that she wants checked out. She hasn't had any urinary sx but states she does have about 4 bowel movement most days,  which are not strenuous and not hard or watery. She does take on magnesium supplement daily for control of palpitations.   She reports it has been at least 6 months since her last seizure. She is tolerating current anti-seizure medications well, although she does feel very fatigued.     Allergies  Allergen Reactions  . Codeine     increased heart rate ;choking feeling     Current Outpatient Medications:  .  ALPRAZolam (XANAX) 1 MG tablet, TAKE 1/2 TO 1 TABLET BY MOUTH EVERY 8 HOURS AS NEEDED, Disp: 90 tablet, Rfl: 3 .  amitriptyline (ELAVIL) 25 MG tablet, TAKE 1 TO 2 TABLETS(25 TO 50 MG) BY MOUTH AT BEDTIME, Disp: 60 tablet, Rfl: 1 .  Aspirin-Acetaminophen-Caffeine (EXCEDRIN PO), Take 1 tablet daily as needed by mouth., Disp: , Rfl:  .  busPIRone (BUSPAR) 30 MG tablet, TAKE 1 TABLET BY MOUTH TWICE DAILY, Disp: 180 tablet, Rfl: 4 .  estradiol (ESTRACE) 1 MG tablet, TAKE 1 TABLET(1 MG) BY MOUTH DAILY, Disp: 90 tablet, Rfl: 0 .  lamoTRIgine (LAMICTAL) 100 MG tablet, TAKE 1 TABLET BY MOUTH TWICE DAILY, Disp: 180 tablet, Rfl: 4 .  Magnesium Oxide 420 MG TABS, Take 400 mg by mouth daily., Disp: , Rfl:  .  meloxicam (MOBIC) 15 MG tablet, TAKE 1 TABLET BY MOUTH EVERY DAY AS NEEDED FOR PAIN, Disp: 90 tablet, Rfl: 4 .  omeprazole (PRILOSEC) 20 MG capsule,  TAKE 1 CAPSULE BY MOUTH ONCE DAILY, Disp: 30 capsule, Rfl: 12 .  ondansetron (ZOFRAN) 4 MG tablet, Take 1 tablet (4 mg total) every 8 (eight) hours as needed by mouth for nausea or vomiting., Disp: 20 tablet, Rfl: 0 .  PARoxetine (PAXIL) 40 MG tablet, TAKE 1 TABLET BY MOUTH EVERY DAY, Disp: 90 tablet, Rfl: 4 .  phenytoin (DILANTIN) 100 MG ER capsule, TAKE 1 CAPSULE(100 MG) BY MOUTH TWICE DAILY, Disp: 180 capsule, Rfl: 4 .  pregabalin (LYRICA) 225 MG capsule, TAKE 1 CAPSULE BY MOUTH TWICE DAILY, Disp: 180 capsule, Rfl: 1 .  PREMPRO 0.625-2.5 MG tablet, TAKE 1 TABLET BY MOUTH DAILY, Disp: 28 tablet, Rfl: 1  Review of Systems  Constitutional:  Positive for fatigue. Negative for activity change, appetite change, chills, diaphoresis, fever and unexpected weight change.  Gastrointestinal: Negative.   Musculoskeletal: Positive for arthralgias, joint swelling, myalgias, neck pain and neck stiffness. Negative for back pain and gait problem.  Neurological: Negative for dizziness, seizures, light-headedness and headaches.  Psychiatric/Behavioral: Positive for decreased concentration, depression and dysphoric mood. Negative for behavioral problems, confusion, hallucinations, self-injury, sleep disturbance and suicidal ideas. The patient is nervous/anxious. The patient does not have insomnia and is not hyperactive.     Social History   Tobacco Use  . Smoking status: Former Smoker    Last attempt to quit: 01/25/2007    Years since quitting: 10.7  . Smokeless tobacco: Never Used  . Tobacco comment: Smoked for about 15 years, smoked about 1 pack a day.  Substance Use Topics  . Alcohol use: Yes    Comment: occasional use; 1-2 times a year   Objective:   BP 130/76 (BP Location: Right Arm, Patient Position: Sitting, Cuff Size: Large)   Pulse 96   Temp 98.3 F (36.8 C) (Oral)   Wt 273 lb (123.8 kg)   LMP 04/26/2002 (LMP Unknown)   SpO2 96%   BMI 49.93 kg/m  Vitals:   10/30/17 1612  BP: 130/76  Pulse: 96  Temp: 98.3 F (36.8 C)  TempSrc: Oral  SpO2: 96%  Weight: 273 lb (123.8 kg)     General Appearance:    Alert, cooperative, no distress, obese  Eyes:    PERRL, conjunctiva/corneas clear, EOM's intact       Lungs:     Clear to auscultation bilaterally, respirations unlabored  Heart:    Regular rate and rhythm  Neurologic:   Awake, alert, oriented x 3. No apparent focal neurological           defect.             Assessment & Plan:     1. Pelvic pain She is s/p remote partial hysterectomy. She requests to have this evaluated by gyn, so will defer any imaging studies at ths time.  - Ambulatory referral to Gynecology  2.  Breast cancer screening  - MM Digital Screening; Future - Ambulatory referral to Gynecology  3. Hot flashes Inadvertently on prempro and estradiol. Will d/c prempro as she is s/p hysterectomy and increase estradiol to 2mg  daily.I'm hesitant to add any other vasomotor modulators due to her current rather long medication list.   - TSH - estradiol (ESTRACE) 2 MG tablet; Take 1 tablet (2 mg total) by mouth daily.  Dispense: 90 tablet; Refill: 3  4. Other fatigue  - Comprehensive metabolic panel - CBC - TSH  5. Seizure disorder ([place]) Very well controlled.  - Dilantin (Phenytoin) level, total  6. Fibromyalgia Has been worse  the last few months which she feels may be related to change to Generic Lyrica. Will try brand name again. Reinforced importance of regular exercise for fibromyalgia symptoms.   7. Iron deficiency anemia, unspecified iron deficiency anemia type  - CBC  8.  Colon cancer screening  - Ambulatory referral to Gastroenterology  09 Encounter for special screening examination for cardiovascular disorder  - Lipid panel       Lelon Huh, MD  [facility]

## 2017-10-31 ENCOUNTER — Other Ambulatory Visit: Payer: Self-pay

## 2017-10-31 DIAGNOSIS — Z1211 Encounter for screening for malignant neoplasm of colon: Secondary | ICD-10-CM

## 2017-10-31 MED ORDER — NA SULFATE-K SULFATE-MG SULF 17.5-3.13-1.6 GM/177ML PO SOLN: 1.0000 | Freq: Once | ORAL | 0 refills | Status: AC

## 2017-11-07 ENCOUNTER — Ambulatory Visit: Payer: Medicare Other | Admitting: Anesthesiology

## 2017-11-07 ENCOUNTER — Encounter: Payer: Self-pay | Admitting: *Deleted

## 2017-11-07 ENCOUNTER — Encounter
Admission: RE | Disposition: A | Discharge status: Home / Self Care | Payer: Self-pay | Source: Ambulatory Visit | Attending: Gastroenterology

## 2017-11-07 ENCOUNTER — Other Ambulatory Visit: Payer: Self-pay

## 2017-11-07 ENCOUNTER — Ambulatory Visit
Admission: RE | Admit: 2017-11-07 | Discharge: 2017-11-07 | Disposition: A | Discharge status: Home / Self Care | Payer: Medicare Other | Source: Ambulatory Visit | Attending: Gastroenterology | Admitting: Gastroenterology

## 2017-11-07 DIAGNOSIS — G40909 Epilepsy, unspecified, not intractable, without status epilepticus: Secondary | ICD-10-CM | POA: Diagnosis not present

## 2017-11-07 DIAGNOSIS — M797 Fibromyalgia: Secondary | ICD-10-CM | POA: Insufficient documentation

## 2017-11-07 DIAGNOSIS — Z87891 Personal history of nicotine dependence: Secondary | ICD-10-CM | POA: Insufficient documentation

## 2017-11-07 DIAGNOSIS — E669 Obesity, unspecified: Secondary | ICD-10-CM | POA: Diagnosis not present

## 2017-11-07 DIAGNOSIS — D509 Iron deficiency anemia, unspecified: Secondary | ICD-10-CM | POA: Insufficient documentation

## 2017-11-07 DIAGNOSIS — K5730 Diverticulosis of large intestine without perforation or abscess without bleeding: Secondary | ICD-10-CM | POA: Diagnosis not present

## 2017-11-07 DIAGNOSIS — Z7982 Long term (current) use of aspirin: Secondary | ICD-10-CM | POA: Diagnosis not present

## 2017-11-07 DIAGNOSIS — K219 Gastro-esophageal reflux disease without esophagitis: Secondary | ICD-10-CM | POA: Diagnosis not present

## 2017-11-07 DIAGNOSIS — Z79899 Other long term (current) drug therapy: Secondary | ICD-10-CM | POA: Insufficient documentation

## 2017-11-07 DIAGNOSIS — Z800 Family history of malignant neoplasm of digestive organs: Secondary | ICD-10-CM | POA: Insufficient documentation

## 2017-11-07 DIAGNOSIS — Z1211 Encounter for screening for malignant neoplasm of colon: Secondary | ICD-10-CM | POA: Diagnosis not present

## 2017-11-07 HISTORY — PX: COLONOSCOPY WITH PROPOFOL: SHX5780

## 2017-11-07 HISTORY — DX: Epilepsy, unspecified, not intractable, without status epilepticus: G40.909

## 2017-11-07 SURGERY — COLONOSCOPY WITH PROPOFOL
Anesthesia: General

## 2017-11-07 MED ORDER — PROPOFOL 500 MG/50ML IV EMUL
INTRAVENOUS | Status: DC | PRN
  Administered 2017-11-07: 200 ug/kg/min via INTRAVENOUS

## 2017-11-07 MED ORDER — PROPOFOL 10 MG/ML IV BOLUS
INTRAVENOUS | Status: DC | PRN
  Administered 2017-11-07: 70 mg via INTRAVENOUS

## 2017-11-07 MED ORDER — LIDOCAINE 2% (20 MG/ML) 5 ML SYRINGE
INTRAMUSCULAR | Status: DC | PRN
  Administered 2017-11-07: 100 mg via INTRAVENOUS

## 2017-11-07 MED ORDER — SODIUM CHLORIDE 0.9 % IV SOLN
INTRAVENOUS | Status: DC
  Administered 2017-11-07: 14:00:00 via INTRAVENOUS

## 2017-11-07 MED ORDER — PROPOFOL 500 MG/50ML IV EMUL
INTRAVENOUS | Status: AC
  Filled 2017-11-07: qty 50

## 2017-11-07 NOTE — Anesthesia Post-op Follow-up Note (Signed)
Anesthesia QCDR form completed.        

## 2017-11-07 NOTE — H&P (Signed)
Cephas Darby, MD [address]  [facility]  [place], [place] [address]  Main: [phone]  Fax: [phone] Pager: [phone]  Primary Care Physician:  Birdie Sons, MD Primary Gastroenterologist:  Dr. Cephas Darby  Pre-Procedure History & Physical: HPI:  Sharon Bryan is a 52 y.o. female is here for an colonoscopy.   Past Medical History:  Diagnosis Date  . Allergy   . Anemia, iron deficiency 09/02/2008  . 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 Bell's palsy   . Fibromyalgia    (worsening)  . Menopause 2015  . Migraine   . Numbness and tingling of right arm and leg   . Sciatica 08/04/2014  . Seizure disorder ([place])   . Thyroid cyst 10/27/2010    Past Surgical History:  Procedure Laterality Date  . ABDOMINAL HYSTERECTOMY  2004   partial, ovaries remain  . CESAREAN SECTION  1998  . CRANIOTOMY  2005   for artetiovenius malformation  . TUBAL LIGATION  1998    Prior to Admission medications   Medication Sig Start Date End Date Taking? Authorizing Provider  ALPRAZolam Duanne Moron) 1 MG tablet TAKE 1/2 TO 1 TABLET BY MOUTH EVERY 8 HOURS AS NEEDED 08/19/17  Yes Birdie Sons, MD  amitriptyline (ELAVIL) 25 MG tablet TAKE 1 TO 2 TABLETS(25 TO 50 MG) BY MOUTH AT BEDTIME 09/29/17  Yes Birdie Sons, MD  Aspirin-Acetaminophen-Caffeine (EXCEDRIN PO) Take 1 tablet daily as needed by mouth.   Yes [provider]  busPIRone (BUSPAR) 30 MG tablet TAKE 1 TABLET BY MOUTH TWICE DAILY 09/08/16  Yes Birdie Sons, MD  estradiol (ESTRACE) 2 MG tablet Take 1 tablet (2 mg total) by mouth daily. 10/30/17  Yes Birdie Sons, MD  lamoTRIgine (LAMICTAL) 100 MG tablet TAKE 1 TABLET BY MOUTH TWICE DAILY 02/01/17  Yes Birdie Sons, MD  LYRICA 225 MG capsule Take 1 capsule (225 mg total) by mouth 2 (two) times daily. 10/30/17  Yes Birdie Sons, MD  Magnesium Oxide 420 MG TABS Take 400 mg by mouth daily.   Yes [provider]  meloxicam  (MOBIC) 15 MG tablet TAKE 1 TABLET BY MOUTH EVERY DAY AS NEEDED FOR PAIN 06/27/17  Yes Birdie Sons, MD  Multiple Vitamins-Minerals (CENTRUM SILVER PO) Take by mouth.   Yes [provider]  omeprazole (PRILOSEC) 20 MG capsule TAKE 1 CAPSULE BY MOUTH ONCE DAILY 02/25/17  Yes Birdie Sons, MD  ondansetron (ZOFRAN) 4 MG tablet Take 1 tablet (4 mg total) every 8 (eight) hours as needed by mouth for nausea or vomiting. 12/01/16  Yes Birdie Sons, MD  PARoxetine (PAXIL) 40 MG tablet TAKE 1 TABLET BY MOUTH EVERY DAY 10/27/17  Yes Birdie Sons, MD  phenytoin (DILANTIN) 100 MG ER capsule TAKE 1 CAPSULE(100 MG) BY MOUTH TWICE DAILY 10/27/17  Yes Birdie Sons, MD    Allergies as of 10/31/2017 - Review Complete 10/30/2017  Allergen Reaction Noted  . Codeine  08/04/2014    Family History  Problem Relation Age of Onset  . Hypertension Mother   . Arthritis Mother     Social History   Socioeconomic History  . Marital status: Divorced    Spouse name: Not on file  . Number of children: Not on file  . Years of education: Not on file  . Highest education level: Not on file  Occupational History  . Not on file  Social Needs  . Financial resource strain: Not  on file  . Food insecurity:    Worry: Not on file    Inability: Not on file  . Transportation needs:    Medical: Not on file    Non-medical: Not on file  Tobacco Use  . Smoking status: Former Smoker    Last attempt to quit: 01/25/2007    Years since quitting: 10.7  . Smokeless tobacco: Never Used  . Tobacco comment: Smoked for about 15 years, smoked about 1 pack a day.  Substance and Sexual Activity  . Alcohol use: Yes    Comment: occasional use; 1-2 times a year  . Drug use: No  . Sexual activity: Not on file  Lifestyle  . Physical activity:    Days per week: Not on file    Minutes per session: Not on file  . Stress: Not on file  Relationships  . Social connections:    Talks on phone: Not on file    Gets  together: Not on file    Attends religious service: Not on file    Active member of club or organization: Not on file    Attends meetings of clubs or organizations: Not on file    Relationship status: Not on file  . Intimate partner violence:    Fear of current or ex partner: Not on file    Emotionally abused: Not on file    Physically abused: Not on file    Forced sexual activity: Not on file  Other Topics Concern  . Not on file  Social History Narrative  . Not on file    Review of Systems: See HPI, otherwise negative ROS  Physical Exam: BP (!) 147/82   Pulse 98   Temp 98.8 F (37.1 C) (Tympanic)   Resp 18   Wt 123.8 kg   LMP 04/26/2002 (LMP Unknown)   SpO2 98%   BMI 49.92 kg/m  General:   Alert,  pleasant and cooperative in NAD Head:  Normocephalic and atraumatic. Neck:  Supple; no masses or thyromegaly. Lungs:  Clear throughout to auscultation.    Heart:  Regular rate and rhythm. Abdomen:  Soft, nontender and nondistended. Normal bowel sounds, without guarding, and without rebound.   Neurologic:  Alert and  oriented x4;  grossly normal neurologically.  Impression/Plan: Sharon Bryan is here for an colonoscopy to be performed for colon cancer screening  Risks, benefits, limitations, and alternatives regarding  colonoscopy have been reviewed with the patient.  Questions have been answered.  All parties agreeable.   [name], MD  11/07/2017, 1:55 PM

## 2017-11-07 NOTE — Anesthesia Preprocedure Evaluation (Signed)
Anesthesia Evaluation  Patient identified by MRN, date of birth, ID band Patient awake    Reviewed: Allergy & Precautions, H&P , NPO status , Patient's Chart, lab work & pertinent test results  History of Anesthesia Complications Negative for: history of anesthetic complications  Airway Mallampati: III  TM Distance: <3 FB Neck ROM: full    Dental  (+) Chipped, Poor Dentition, Missing   Pulmonary neg shortness of breath, former smoker,           Cardiovascular Exercise Tolerance: Good (-) angina(-) Past MI and (-) DOE      Neuro/Psych  Headaches, Seizures -, Well Controlled,  PSYCHIATRIC DISORDERS  Neuromuscular disease negative psych ROS   GI/Hepatic negative GI ROS, Neg liver ROS, GERD  Medicated and Controlled,  Endo/Other  negative endocrine ROS  Renal/GU negative Renal ROS  negative genitourinary   Musculoskeletal  (+) Fibromyalgia -  Abdominal   Peds  Hematology negative hematology ROS (+)   Anesthesia Other Findings Past Medical History: No date: Allergy 09/02/2008: Anemia, iron deficiency 07/09/2003: AVM (arteriovenous malformation) brain No date: Bell's palsy No date: Fibromyalgia     Comment:  (worsening) 2015: Menopause No date: Migraine No date: Numbness and tingling of right arm and leg 08/04/2014: Sciatica 10/27/2010: Thyroid cyst  Past Surgical History: 2004: ABDOMINAL HYSTERECTOMY     Comment:  partial, ovaries remain 1998: CESAREAN SECTION 2005: CRANIOTOMY     Comment:  for artetiovenius malformation 1998: TUBAL LIGATION  BMI    Body Mass Index:  49.92 kg/m      Reproductive/Obstetrics negative OB ROS                             Anesthesia Physical Anesthesia Plan  ASA: III  Anesthesia Plan: General   Post-op Pain Management:    Induction: Intravenous  PONV Risk Score and Plan: Propofol infusion and TIVA  Airway Management Planned: Natural Airway  and Nasal Cannula  Additional Equipment:   Intra-op Plan:   Post-operative Plan:   Informed Consent: I have reviewed the patients History and Physical, chart, labs and discussed the procedure including the risks, benefits and alternatives for the proposed anesthesia with the patient or authorized representative who has indicated his/her understanding and acceptance.   Dental Advisory Given  Plan Discussed with: Anesthesiologist, CRNA and Surgeon  Anesthesia Plan Comments: (Patient consented for risks of anesthesia including but not limited to:  - adverse reactions to medications - risk of intubation if required - damage to teeth, lips or other oral mucosa - sore throat or hoarseness - Damage to heart, brain, lungs or loss of life  Patient voiced understanding.)        Anesthesia Quick Evaluation

## 2017-11-07 NOTE — Op Note (Signed)
[facility] Patient Name: Sharon Bryan Procedure Date: 11/07/2017 2:57 PM MRN: [id number] Account #: [id number] Date of Birth: 03/14/1965 Admit Type: Outpatient Age: 52 Room: [facility] ENDO ROOM 4 Gender: Female Note Status: Finalized Procedure:            Colonoscopy Indications:          Colon cancer screening in patient at increased risk:                        Family history of colorectal cancer in multiple 2nd                        degree relatives, This is the patient's first                        colonoscopy Providers:            Lin Landsman MD, MD Referring MD:         Kirstie Peri. Caryn Section, MD (Referring MD) Medicines:            Monitored Anesthesia Care Complications:        No immediate complications. Estimated blood loss: None. Procedure:            Pre-Anesthesia Assessment:                       - Prior to the procedure, a History and Physical was                        performed, and patient medications and allergies were                        reviewed. The patient is competent. The risks and                        benefits of the procedure and the sedation options and                        risks were discussed with the patient. All questions                        were answered and informed consent was obtained.                        Patient identification and proposed procedure were                        verified by the physician, the nurse, the                        anesthesiologist, the anesthetist and the technician in                        the pre-procedure area in the procedure room in the                        endoscopy suite. Mental Status Examination: alert and                        oriented. Airway Examination: normal oropharyngeal  airway and neck mobility. Respiratory Examination:                        clear to auscultation. CV Examination: normal.   Prophylactic Antibiotics: The patient does not require                        prophylactic antibiotics. Prior Anticoagulants: The                        patient has taken no previous anticoagulant or                        antiplatelet agents. ASA Grade Assessment: III - A                        patient with severe systemic disease. After reviewing                        the risks and benefits, the patient was deemed in                        satisfactory condition to undergo the procedure. The                        anesthesia plan was to use monitored anesthesia care                        (MAC). Immediately prior to administration of                        medications, the patient was re-assessed for adequacy                        to receive sedatives. The heart rate, respiratory rate,                        oxygen saturations, blood pressure, adequacy of                        pulmonary ventilation, and response to care were                        monitored throughout the procedure. The physical status                        of the patient was re-assessed after the procedure.                       After obtaining informed consent, the colonoscope was                        passed under direct vision. Throughout the procedure,                        the patient's blood pressure, pulse, and oxygen                        saturations were monitored continuously. The  Colonoscope was introduced through the anus and                        advanced to the the cecum, identified by appendiceal                        orifice and ileocecal valve. The colonoscopy was                        performed with moderate difficulty due to multiple                        diverticula in the colon and the patient's body                        habitus. Successful completion of the procedure was                        aided by increasing the dose of sedation medication and                         applying abdominal pressure. The patient tolerated the                        procedure well. The quality of the bowel preparation                        was evaluated using the BBPS [facility] Bowel Preparation                        Scale) with scores of: Right Colon = 3, Transverse                        Colon = 3 and Left Colon = 3 (entire mucosa seen well                        with no residual staining, small fragments of stool or                        opaque liquid). The total BBPS score equals 9. Findings:      The perianal and digital rectal examinations were normal. Pertinent       negatives include normal sphincter tone and no palpable rectal lesions.      Multiple diverticula were found in the recto-sigmoid colon and sigmoid       colon. There was narrowing of the colon in association with the       diverticular opening.      Normal mucosa was found in the entire colon.      The entire examined colon appeared normal. Impression:           - Mild diverticulosis in the recto-sigmoid colon and in                        the sigmoid colon. There was narrowing of the colon in                        association with the diverticular opening.                       -  Normal mucosa in the entire examined colon.                       - The entire examined colon is normal.                       - No specimens collected. Recommendation:       - Discharge patient to home (with spouse).                       - Resume previous diet today.                       - Continue present medications.                       - Repeat colonoscopy in 5 years for surveillance. Procedure Code(s):    --- Professional ---                       O5929, Colorectal cancer screening; colonoscopy on                        individual not meeting criteria for high risk Diagnosis Code(s):    --- Professional ---                       Z80.0, Family history of malignant neoplasm of                        digestive  organs                       K57.30, Diverticulosis of large intestine without                        perforation or abscess without bleeding CPT copyright 2018 American Medical Association. All rights reserved. The codes documented in this report are preliminary and upon coder review may  be revised to meet current compliance requirements. Dr. Ulyess Mort Lin Landsman MD, MD 11/07/2017 3:23:33 PM This report has been signed electronically. Number of Addenda: 0 Note Initiated On: 11/07/2017 2:57 PM Scope Withdrawal Time: 0 hours 7 minutes 6 seconds  Total Procedure Duration: 0 hours 13 minutes 54 seconds       [facility]

## 2017-11-07 NOTE — Transfer of Care (Signed)
Immediate Anesthesia Transfer of Care Note  Patient: Sharon Bryan  Procedure(s) Performed: COLONOSCOPY WITH PROPOFOL (N/A )  Patient Location: Endoscopy Unit  Anesthesia Type:General  Level of Consciousness: awake, alert  and oriented  Airway & Oxygen Therapy: Patient connected to nasal cannula oxygen  Post-op Assessment: Post -op Vital signs reviewed and stable  Post vital signs: stable  Last Vitals:  Vitals Value Taken Time  BP    Temp    Pulse 94 11/07/2017  3:23 PM  Resp 14 11/07/2017  3:23 PM  SpO2 96 % 11/07/2017  3:23 PM    Last Pain:  Vitals:   11/07/17 1333  TempSrc: Tympanic  PainSc: 0-No pain         Complications: No apparent anesthesia complications

## 2017-11-09 ENCOUNTER — Encounter: Payer: Self-pay | Admitting: Obstetrics and Gynecology

## 2017-11-17 ENCOUNTER — Other Ambulatory Visit: Payer: Self-pay | Admitting: Family Medicine

## 2017-11-20 NOTE — Anesthesia Postprocedure Evaluation (Signed)
Anesthesia Post Note  Patient: Sharon Bryan  Procedure(s) Performed: COLONOSCOPY WITH PROPOFOL (N/A )  Patient location during evaluation: PACU Anesthesia Type: General Level of consciousness: awake and alert and oriented Pain management: pain level controlled Vital Signs Assessment: post-procedure vital signs reviewed and stable Respiratory status: spontaneous breathing Cardiovascular status: blood pressure returned to baseline Anesthetic complications: no     Last Vitals:  Vitals:   11/07/17 1542 11/07/17 1552  BP: (!) 141/88 134/76  Pulse:    Resp:    Temp:    SpO2:      Last Pain:  Vitals:   11/07/17 1552  TempSrc:   PainSc: 0-No pain                 Robertt Buda

## 2017-11-26 ENCOUNTER — Other Ambulatory Visit: Payer: Self-pay | Admitting: Family Medicine

## 2017-12-03 ENCOUNTER — Other Ambulatory Visit: Payer: Self-pay | Admitting: Family Medicine

## 2018-02-02 ENCOUNTER — Other Ambulatory Visit: Payer: Self-pay | Admitting: Family Medicine

## 2018-02-15 ENCOUNTER — Other Ambulatory Visit: Payer: Self-pay | Admitting: Family Medicine

## 2018-03-26 ENCOUNTER — Other Ambulatory Visit: Payer: Self-pay | Admitting: Family Medicine

## 2018-04-18 ENCOUNTER — Ambulatory Visit (INDEPENDENT_AMBULATORY_CARE_PROVIDER_SITE_OTHER): Payer: Medicare Other | Admitting: Family Medicine

## 2018-04-18 ENCOUNTER — Other Ambulatory Visit: Payer: Self-pay

## 2018-04-18 ENCOUNTER — Encounter: Payer: Self-pay | Admitting: Family Medicine

## 2018-04-18 VITALS — Temp 96.5°F

## 2018-04-18 DIAGNOSIS — J069 Acute upper respiratory infection, unspecified: Secondary | ICD-10-CM

## 2018-04-18 DIAGNOSIS — H9202 Otalgia, left ear: Secondary | ICD-10-CM | POA: Diagnosis not present

## 2018-04-18 MED ORDER — AMOXICILLIN 500 MG PO CAPS: 1000.0000 mg | ORAL_CAPSULE | Freq: Two times a day (BID) | ORAL | 0 refills | Status: AC

## 2018-04-18 NOTE — Progress Notes (Signed)
Patient: Sharon Bryan Female    DOB: April 29, 1965   53 y.o.   MRN: [id number] Visit Date: 04/18/2018  Today's Provider: Lelon Huh, MD   Chief Complaint  Patient presents with  . URI   Subjective:     URI   Pertinent negatives include no abdominal pain, chest pain, coughing, nausea or vomiting.  Patient reports onset of sore throat and head acheaacross top and sides and top of head one week ago after visiting a home with smokers. Headaches have gotten a little worse of the week and throat pain ha improved. No coughing, no shortness of breath, no fevers, chills or sweats. Has developed pain in her left ear over the last couple of days. Is taking no OTC Allergy medications.   Allergies  Allergen Reactions  . Codeine     increased heart rate ;choking feeling     Current Outpatient Medications:  .  ALPRAZolam (XANAX) 1 MG tablet, TAKE 1/2 TO 1 TABLET BY MOUTH EVERY 8 HOURS AS NEEDED, Disp: 90 tablet, Rfl: 5 .  amitriptyline (ELAVIL) 25 MG tablet, TAKE 1 TO 2 TABLETS(25 TO 50 MG) BY MOUTH AT BEDTIME, Disp: 60 tablet, Rfl: 11 .  Aspirin-Acetaminophen-Caffeine (EXCEDRIN PO), Take 1 tablet daily as needed by mouth., Disp: , Rfl:  .  busPIRone (BUSPAR) 30 MG tablet, TAKE 1 TABLET BY MOUTH TWICE DAILY, Disp: 180 tablet, Rfl: 4 .  estradiol (ESTRACE) 2 MG tablet, Take 1 tablet (2 mg total) by mouth daily., Disp: 90 tablet, Rfl: 3 .  lamoTRIgine (LAMICTAL) 100 MG tablet, TAKE 1 TABLET BY MOUTH TWICE DAILY, Disp: 180 tablet, Rfl: 4 .  LYRICA 225 MG capsule, Take 1 capsule (225 mg total) by mouth 2 (two) times daily., Disp: 90 capsule, Rfl: 4 .  Magnesium Oxide 420 MG TABS, Take 400 mg by mouth daily., Disp: , Rfl:  .  meloxicam (MOBIC) 15 MG tablet, TAKE 1 TABLET BY MOUTH EVERY DAY AS NEEDED FOR PAIN, Disp: 90 tablet, Rfl: 4 .  Multiple Vitamins-Minerals (CENTRUM SILVER PO), Take by mouth., Disp: , Rfl:  .  omeprazole (PRILOSEC) 20 MG capsule, TAKE 1 CAPSULE BY MOUTH ONCE  DAILY, Disp: 30 capsule, Rfl: 12 .  ondansetron (ZOFRAN) 4 MG tablet, Take 1 tablet (4 mg total) every 8 (eight) hours as needed by mouth for nausea or vomiting., Disp: 20 tablet, Rfl: 0 .  PARoxetine (PAXIL) 40 MG tablet, TAKE 1 TABLET BY MOUTH EVERY DAY, Disp: 90 tablet, Rfl: 4 .  phenytoin (DILANTIN) 100 MG ER capsule, TAKE 1 CAPSULE(100 MG) BY MOUTH TWICE DAILY, Disp: 180 capsule, Rfl: 4  Review of Systems  Constitutional: Negative for appetite change, chills, fatigue and fever.  Respiratory: Negative for cough, chest tightness and shortness of breath.   Cardiovascular: Negative for chest pain and palpitations.  Gastrointestinal: Negative for abdominal pain, nausea and vomiting.  Neurological: Negative for dizziness and weakness.    Social History   Tobacco Use  . Smoking status: Former Smoker    Last attempt to quit: 01/25/2007    Years since quitting: 11.2  . Smokeless tobacco: Never Used  . Tobacco comment: Smoked for about 15 years, smoked about 1 pack a day.  Substance Use Topics  . Alcohol use: Yes    Comment: occasional use; 1-2 times a year      Objective:   Temp (!) 96.5 F (35.8 C) (Oral) Comment: patient reported  LMP 04/26/2002 (LMP Unknown)  Vitals:   04/18/18  1142  Temp: (!) 96.5 F (35.8 C)  TempSrc: Oral     Physical Exam  General appearance: alert, well developed, well nourished, cooperative and in no distress Head: Normocephalic, without obvious abnormality, atraumatic Respiratory: Respirations even and unlabored, normal respiratory rate  Patient reports tenderness left anterior cervical lymph nodes with palpation and tenderness of maxillary sinuses. .     Assessment & Plan      1. Left ear pain Likely otitis media - amoxicillin (AMOXIL) 500 MG capsule; Take 2 capsules (1,000 mg total) by mouth 2 (two) times daily for 10 days.  Dispense: 40 capsule; Refill: 0  2. Upper respiratory tract infection, unspecified type Counseled on natural  history and progression of viral uris.  - amoxicillin (AMOXIL) 500 MG capsule; Take 2 capsules (1,000 mg total) by mouth 2 (two) times daily for 10 days.  Dispense: 40 capsule; Refill: 0  Virtual Visit via Video Note  I connected with Sharon Bryan on 04/18/18 at  1:40 PM EDT by a video enabled telemedicine application and verified that I am speaking with the correct person using two identifiers.   I discussed the limitations of evaluation and management by telemedicine and the availability of in person appointments. The patient expressed understanding and agreed to proceed.     I discussed the assessment and treatment plan with the patient. The patient was provided an opportunity to ask questions and all were answered. The patient agreed with the plan and demonstrated an understanding of the instructions.   The patient was advised to call back or seek an in-person evaluation if the symptoms worsen or if the condition fails to improve as anticipated.  I provided 10 minutes of non-face-to-face time during this encounter.      Lelon Huh, MD  [facility]

## 2018-04-18 NOTE — Patient Instructions (Signed)
.   Please review the attached list of medications and notify my office if there are any errors.   . Please bring all of your medications to every appointment so we can make sure that our medication list is the same as yours.   

## 2018-06-01 ENCOUNTER — Ambulatory Visit (INDEPENDENT_AMBULATORY_CARE_PROVIDER_SITE_OTHER): Payer: Medicare Other | Admitting: Family Medicine

## 2018-06-01 ENCOUNTER — Other Ambulatory Visit: Payer: Self-pay

## 2018-06-01 ENCOUNTER — Encounter: Payer: Self-pay | Admitting: Family Medicine

## 2018-06-01 VITALS — Wt 292.0 lb

## 2018-06-01 DIAGNOSIS — G40909 Epilepsy, unspecified, not intractable, without status epilepticus: Secondary | ICD-10-CM | POA: Diagnosis not present

## 2018-06-01 DIAGNOSIS — F329 Major depressive disorder, single episode, unspecified: Secondary | ICD-10-CM | POA: Diagnosis not present

## 2018-06-01 DIAGNOSIS — F32A Depression, unspecified: Secondary | ICD-10-CM

## 2018-06-01 MED ORDER — BUPROPION HCL ER (XL) 150 MG PO TB24: 150.0000 mg | ORAL_TABLET | ORAL | 0 refills | Status: DC

## 2018-06-01 NOTE — Progress Notes (Signed)
Patient: Sharon Bryan Female    DOB: Jun 16, 1965   53 y.o.   MRN: [id number] Visit Date: 06/01/2018  Today's Provider: Lelon Huh, MD   Subjective:    Virtual Visit via Video Note  I connected with [name] on 06/01/18 at 10:40 AM EDT by a video enabled telemedicine application and verified that I am speaking with the correct person using two identifiers.  I discussed the limitations of evaluation and management by telemedicine and the availability of in person appointments. The patient expressed understanding and agreed to proceed.  History of Present Illness:  She states she has felt more depressed over the last 4-5 months, off an on, but much worse the last few weeks since her dog died. Has no energy, feels sleepy all day, but can't sleep at night. Is not active due to lack of energy.    Allergies  Allergen Reactions  . Codeine     increased heart rate ;choking feeling     Current Outpatient Medications:  .  ALPRAZolam (XANAX) 1 MG tablet, TAKE 1/2 TO 1 TABLET BY MOUTH EVERY 8 HOURS AS NEEDED, Disp: 90 tablet, Rfl: 5 .  amitriptyline (ELAVIL) 25 MG tablet, TAKE 1 TO 2 TABLETS(25 TO 50 MG) BY MOUTH AT BEDTIME, Disp: 60 tablet, Rfl: 11 .  Aspirin-Acetaminophen-Caffeine (EXCEDRIN PO), Take 1 tablet daily as needed by mouth., Disp: , Rfl:  .  busPIRone (BUSPAR) 30 MG tablet, TAKE 1 TABLET BY MOUTH TWICE DAILY, Disp: 180 tablet, Rfl: 4 .  estradiol (ESTRACE) 2 MG tablet, Take 1 tablet (2 mg total) by mouth daily., Disp: 90 tablet, Rfl: 3 .  lamoTRIgine (LAMICTAL) 100 MG tablet, TAKE 1 TABLET BY MOUTH TWICE DAILY, Disp: 180 tablet, Rfl: 4 .  LYRICA 225 MG capsule, Take 1 capsule (225 mg total) by mouth 2 (two) times daily., Disp: 90 capsule, Rfl: 4 .  Magnesium Oxide 420 MG TABS, Take 400 mg by mouth daily., Disp: , Rfl:  .  meloxicam (MOBIC) 15 MG tablet, TAKE 1 TABLET BY MOUTH EVERY DAY AS NEEDED FOR PAIN, Disp: 90 tablet, Rfl: 4 .  Multiple  Vitamins-Minerals (CENTRUM SILVER PO), Take by mouth., Disp: , Rfl:  .  omeprazole (PRILOSEC) 20 MG capsule, TAKE 1 CAPSULE BY MOUTH ONCE DAILY, Disp: 30 capsule, Rfl: 12 .  ondansetron (ZOFRAN) 4 MG tablet, Take 1 tablet (4 mg total) every 8 (eight) hours as needed by mouth for nausea or vomiting., Disp: 20 tablet, Rfl: 0 .  PARoxetine (PAXIL) 40 MG tablet, TAKE 1 TABLET BY MOUTH EVERY DAY, Disp: 90 tablet, Rfl: 4 .  phenytoin (DILANTIN) 100 MG ER capsule, TAKE 1 CAPSULE(100 MG) BY MOUTH TWICE DAILY, Disp: 180 capsule, Rfl: 4  Review of Systems  Constitutional: Negative for appetite change, chills, fatigue and fever.  Respiratory: Negative for chest tightness and shortness of breath.   Cardiovascular: Negative for chest pain and palpitations.  Gastrointestinal: Negative for abdominal pain, nausea and vomiting.  Neurological: Negative for dizziness and weakness.    Social History   Tobacco Use  . Smoking status: Former Smoker    Last attempt to quit: 01/25/2007    Years since quitting: 11.3  . Smokeless tobacco: Never Used  . Tobacco comment: Smoked for about 15 years, smoked about 1 pack a day.  Substance Use Topics  . Alcohol use: Yes    Comment: occasional use; 1-2 times a year      Objective:   LMP 04/26/2002 (  LMP Unknown)  There were no vitals filed for this visit.   Physical Exam  General appearance: alert, well developed, well nourished, cooperative and in no distress Head: Normocephalic, without obvious abnormality, atraumatic Respiratory: Respirations even and unlabored, normal respiratory rate Psych: Appropriate mood and affect. Tearful at times, especially when talking about her dog that recently died.  Neurologic: Mental status: Alert, oriented to person, place, and time, thought content appropriate.     Assessment & Plan    Follow Up Instructions:   1. Depressive disorder Worsening and associated with very poor energy the last few months, encouraged getting  more exercise and to get outside while maintaining social distancing. Discussed options of adding or changing to activating antidepressant. She has done well with paroxetine for several years, so will try an add-on first. Counseled that bupropion can lower seizure threshold, but she has taking in the past without problems. Will start cautiously at low dose of 150mg  a day and follow up in about a month. Consider change to SNRI if not effective.   I discussed the assessment and treatment plan with the patient. The patient was provided an opportunity to ask questions and all were answered. The patient agreed with the plan and demonstrated an understanding of the instructions.   The patient was advised to call back or seek an in-person evaluation if the symptoms worsen or if the condition fails to improve as anticipated.  2. Seizure disorder Counseled about potential for bupropion to lower seizure threshold as above.   I provided 12 minutes of non-face-to-face time during this encounter.      Lelon Huh, MD  [facility]

## 2018-06-01 NOTE — Patient Instructions (Signed)
.   Please review the attached list of medications and notify my office if there are any errors.   . Please bring all of your medications to every appointment so we can make sure that our medication list is the same as yours.   

## 2018-06-12 ENCOUNTER — Other Ambulatory Visit: Payer: Self-pay | Admitting: Family Medicine

## 2018-06-29 ENCOUNTER — Other Ambulatory Visit: Payer: Self-pay

## 2018-06-29 ENCOUNTER — Ambulatory Visit (INDEPENDENT_AMBULATORY_CARE_PROVIDER_SITE_OTHER): Payer: Medicare Other | Admitting: Family Medicine

## 2018-06-29 DIAGNOSIS — Z6841 Body Mass Index (BMI) 40.0 and over, adult: Secondary | ICD-10-CM | POA: Diagnosis not present

## 2018-06-29 DIAGNOSIS — F329 Major depressive disorder, single episode, unspecified: Secondary | ICD-10-CM

## 2018-06-29 DIAGNOSIS — F32A Depression, unspecified: Secondary | ICD-10-CM

## 2018-06-29 MED ORDER — BUPROPION HCL ER (XL) 150 MG PO TB24: 150.0000 mg | ORAL_TABLET | ORAL | 1 refills | Status: DC

## 2018-06-29 MED ORDER — LIRAGLUTIDE -WEIGHT MANAGEMENT 18 MG/3ML ~~LOC~~ SOPN: PEN_INJECTOR | SUBCUTANEOUS | 12 refills | Status: AC

## 2018-06-29 NOTE — Patient Instructions (Signed)
.   Please review the attached list of medications and notify my office if there are any errors.   . Please bring all of your medications to every appointment so we can make sure that our medication list is the same as yours.   

## 2018-06-29 NOTE — Progress Notes (Signed)
Patient: Sharon Bryan Female    DOB: 09-18-1965   53 y.o.   MRN: [id number] Visit Date: 06/29/2018  Today's Provider: Lelon Huh, MD   Chief Complaint  Patient presents with  . Depression   Subjective:    Virtual Visit via Video Note  I connected with Sharon Bryan on 06/29/18 at  1:20 PM EDT by a video enabled telemedicine application and verified that I am speaking with the correct person using two identifiers.  I discussed the limitations of evaluation and management by telemedicine and the availability of in person appointments. The patient expressed understanding and agreed to proceed.  History of Present Illness: Had virtual video visit on 06/01/2018 at which time I added bupropion 150mg  daily to paroxetine which has taken for several years. She states she started having improved mood and feeling more outgoing within a few days, and is now starting to have more energy. Has had no adverse effects from medications. She has also gotten a new Pitbull puppy as her adult Pitbull died a few months ago. Is more active since getting puppy.   She would also like to try Saxenda to help with weight. Her husband was prescribed saxenda several months ago and did very well with it.    Observations/Objective:   Assessment and Plan:    Depression         This is a chronic problem.  The problem has been gradually improving (Pt reports 65-70% improvement. ) since onset.  Compliance with treatment is good.   Depression screen [facility] 2/9 06/29/2018 11/26/2014  Decreased Interest 0 0  Down, Depressed, Hopeless 0 1  PHQ - 2 Score 0 1  Altered sleeping 0 3  Tired, decreased energy 2 1  Change in appetite 0 3  Feeling bad or failure about yourself  3 2  Trouble concentrating 0 1  Moving slowly or fidgety/restless 0 0  Suicidal thoughts 0 0  PHQ-9 Score 5 11  Difficult doing work/chores Extremely dIfficult Somewhat difficult    Allergies  Allergen Reactions  .  Codeine     increased heart rate ;choking feeling     Current Outpatient Medications:  .  ALPRAZolam (XANAX) 1 MG tablet, TAKE 1/2 TO 1 TABLET BY MOUTH EVERY 8 HOURS AS NEEDED, Disp: 90 tablet, Rfl: 5 .  amitriptyline (ELAVIL) 25 MG tablet, TAKE 1 TO 2 TABLETS(25 TO 50 MG) BY MOUTH AT BEDTIME, Disp: 60 tablet, Rfl: 11 .  Aspirin-Acetaminophen-Caffeine (EXCEDRIN PO), Take 1 tablet daily as needed by mouth., Disp: , Rfl:  .  buPROPion (WELLBUTRIN XL) 150 MG 24 hr tablet, Take 1 tablet (150 mg total) by mouth every morning., Disp: 30 tablet, Rfl: 0 .  busPIRone (BUSPAR) 30 MG tablet, TAKE 1 TABLET BY MOUTH TWICE DAILY, Disp: 180 tablet, Rfl: 4 .  estradiol (ESTRACE) 2 MG tablet, Take 1 tablet (2 mg total) by mouth daily., Disp: 90 tablet, Rfl: 3 .  lamoTRIgine (LAMICTAL) 100 MG tablet, TAKE 1 TABLET BY MOUTH TWICE DAILY, Disp: 180 tablet, Rfl: 4 .  LYRICA 225 MG capsule, TAKE ONE CAPSULE BY MOUTH TWICE DAILY, Disp: 90 capsule, Rfl: 3 .  Magnesium Oxide 420 MG TABS, Take 400 mg by mouth daily., Disp: , Rfl:  .  meloxicam (MOBIC) 15 MG tablet, TAKE 1 TABLET BY MOUTH EVERY DAY AS NEEDED FOR PAIN, Disp: 90 tablet, Rfl: 4 .  Multiple Vitamins-Minerals (CENTRUM SILVER PO), Take by mouth., Disp: , Rfl:  .  omeprazole (PRILOSEC) 20 MG capsule, TAKE 1 CAPSULE BY MOUTH ONCE DAILY, Disp: 30 capsule, Rfl: 12 .  ondansetron (ZOFRAN) 4 MG tablet, Take 1 tablet (4 mg total) every 8 (eight) hours as needed by mouth for nausea or vomiting., Disp: 20 tablet, Rfl: 0 .  PARoxetine (PAXIL) 40 MG tablet, TAKE 1 TABLET BY MOUTH EVERY DAY, Disp: 90 tablet, Rfl: 4 .  phenytoin (DILANTIN) 100 MG ER capsule, TAKE 1 CAPSULE(100 MG) BY MOUTH TWICE DAILY, Disp: 180 capsule, Rfl: 4  Review of Systems  Psychiatric/Behavioral: Positive for depression.    Social History   Tobacco Use  . Smoking status: Former Smoker    Last attempt to quit: 01/25/2007    Years since quitting: 11.4  . Smokeless tobacco: Never Used  .  Tobacco comment: Smoked for about 15 years, smoked about 1 pack a day.  Substance Use Topics  . Alcohol use: Yes    Comment: occasional use; 1-2 times a year      Objective:   LMP 04/26/2002 (LMP Unknown)  There were no vitals filed for this visit. BMI Readings from Last 3 Encounters:  06/01/18 53.41 kg/m  11/07/17 49.92 kg/m  10/30/17 49.93 kg/m     Physical Exam  General appearance: alert, well developed, well nourished, cooperative and in no distress Head: Normocephalic, without obvious abnormality, atraumatic Respiratory: Respirations even and unlabored, normal respiratory rate Extremities: No gross deformities Skin: Skin color, texture, turgor normal. No rashes seen  Psych: Appropriate mood and affect. Neurologic: Mental status: Alert, oriented to person, place, and time, thought content appropriate.     Assessment & Plan    1. Depressive disorder Significantly improved with addition of- buPROPion (WELLBUTRIN XL) 150 MG 24 hr tablet; Take 1 tablet (150 mg total) by mouth every morning.  Dispense: 90 tablet; Refill: 1  2. Class 3 severe obesity without serious comorbidity with body mass index (BMI) of 50.0 to 59.9 in adult, unspecified obesity type [facility]) Prescription to initiate trial of Saxenda.      Follow Up Instructions:    I discussed the assessment and treatment plan with the patient. The patient was provided an opportunity to ask questions and all were answered. The patient agreed with the plan and demonstrated an understanding of the instructions.   The patient was advised to call back or seek an in-person evaluation if the symptoms worsen or if the condition fails to improve as anticipated.  I provided 12 minutes of non-face-to-face time during this encounter.   Lelon Huh, MD  [facility]

## 2018-07-06 ENCOUNTER — Encounter: Payer: Self-pay | Admitting: Family Medicine

## 2018-09-14 ENCOUNTER — Other Ambulatory Visit: Payer: Self-pay | Admitting: Family Medicine

## 2018-10-15 ENCOUNTER — Other Ambulatory Visit: Payer: Self-pay | Admitting: Family Medicine

## 2018-10-18 ENCOUNTER — Encounter: Payer: Self-pay | Admitting: Family Medicine

## 2018-10-18 NOTE — Progress Notes (Signed)
Patient: Sharon Bryan Female    DOB: November 06, 1965   53 y.o.   MRN: [id number] Visit Date: 10/18/2018  Today's Provider: Lelon Huh, MD   Chief Complaint  Patient presents with  . URI    x 1 week   Subjective:    Virtual Visit via Video Note  I connected with Sharon Bryan on 10/29/18 at  8:00 AM EDT by a video enabled telemedicine application and verified that I am speaking with the correct person using two identifiers.   I discussed the limitations of evaluation and management by telemedicine and the availability of in person appointments. The patient expressed understanding and agreed to proceed.     Location: Patient: Home Provider: [facility]   I discussed the limitations, risks, security and privacy concerns of performing an evaluation and management service by telephone and the availability of in person appointments. I also discussed with the patient that there may be a patient responsible charge related to this service. The patient expressed understanding and agreed to proceed.   URI  This is a new problem. Episode onset: 1 week ago. The problem has been unchanged. There has been no fever. Associated symptoms include headaches, sinus pain and sneezing. Pertinent negatives include no abdominal pain, chest pain, ear pain, nausea, sore throat or vomiting. Treatments tried: OTC cold medication. The treatment provided no relief.      Allergies  Allergen Reactions  . Codeine     increased heart rate ;choking feeling     Current Outpatient Medications:  .  ALPRAZolam (XANAX) 1 MG tablet, TAKE 1/2 TO 1 TABLET BY MOUTH EVERY 8 HOURS AS NEEDED, Disp: 90 tablet, Rfl: 3 .  amitriptyline (ELAVIL) 25 MG tablet, TAKE 1 TO 2 TABLETS(25 TO 50 MG) BY MOUTH AT BEDTIME, Disp: 60 tablet, Rfl: 11 .  Aspirin-Acetaminophen-Caffeine (EXCEDRIN PO), Take 1 tablet daily as needed by mouth., Disp: , Rfl:  .  buPROPion (WELLBUTRIN XL) 150 MG 24 hr  tablet, Take 1 tablet (150 mg total) by mouth every morning., Disp: 90 tablet, Rfl: 1 .  busPIRone (BUSPAR) 30 MG tablet, TAKE 1 TABLET BY MOUTH TWICE DAILY, Disp: 180 tablet, Rfl: 4 .  estradiol (ESTRACE) 2 MG tablet, Take 1 tablet (2 mg total) by mouth daily., Disp: 90 tablet, Rfl: 3 .  lamoTRIgine (LAMICTAL) 100 MG tablet, TAKE 1 TABLET BY MOUTH TWICE DAILY, Disp: 180 tablet, Rfl: 4 .  LYRICA 225 MG capsule, TAKE ONE CAPSULE BY MOUTH TWICE DAILY, Disp: 90 capsule, Rfl: 3 .  Magnesium Oxide 420 MG TABS, Take 400 mg by mouth daily., Disp: , Rfl:  .  meloxicam (MOBIC) 15 MG tablet, TAKE 1 TABLET BY MOUTH EVERY DAY AS NEEDED FOR PAIN, Disp: 90 tablet, Rfl: 4 .  Multiple Vitamins-Minerals (CENTRUM SILVER PO), Take by mouth., Disp: , Rfl:  .  omeprazole (PRILOSEC) 20 MG capsule, TAKE 1 CAPSULE BY MOUTH ONCE DAILY, Disp: 30 capsule, Rfl: 12 .  ondansetron (ZOFRAN) 4 MG tablet, Take 1 tablet (4 mg total) every 8 (eight) hours as needed by mouth for nausea or vomiting., Disp: 20 tablet, Rfl: 0 .  PARoxetine (PAXIL) 40 MG tablet, TAKE 1 TABLET BY MOUTH EVERY DAY, Disp: 90 tablet, Rfl: 4 .  phenytoin (DILANTIN) 100 MG ER capsule, TAKE 1 CAPSULE(100 MG) BY MOUTH TWICE DAILY, Disp: 180 capsule, Rfl: 4  Review of Systems  Constitutional: Negative for appetite change, chills, fatigue and fever.  HENT: Positive for postnasal  drip, sinus pressure, sinus pain and sneezing. Negative for ear pain and sore throat.   Eyes: Positive for itching and visual disturbance.  Respiratory: Negative for chest tightness and shortness of breath.   Cardiovascular: Negative for chest pain and palpitations.  Gastrointestinal: Negative for abdominal pain, nausea and vomiting.  Neurological: Positive for seizures and headaches. Negative for dizziness and weakness.    Social History   Tobacco Use  . Smoking status: Former Smoker    Quit date: 01/25/2007    Years since quitting: 11.7  . Smokeless tobacco: Never Used  .  Tobacco comment: Smoked for about 15 years, smoked about 1 pack a day.  Substance Use Topics  . Alcohol use: Yes    Comment: occasional use; 1-2 times a year      Objective:   LMP 04/26/2002 (LMP Unknown)  There were no vitals filed for this visit.There is no height or weight on file to calculate BMI.   Physical Exam  General appearance: Well developed, well nourished female, cooperative and in no acute distress Head: Normocephalic, without obvious abnormality, atraumatic Respiratory: Respirations even and unlabored, normal respiratory rate Extremities: All extremities are intact.  Skin: Skin color, texture, turgor normal. No rashes seen  Psych: Appropriate mood and affect. Neurologic: Mental status: Alert, oriented to person, place, and time, thought content appropriate.     Assessment & Plan    1. Acute non-recurrent frontal sinusitis  - amoxicillin (AMOXIL) 500 MG capsule; Take 2 capsules (1,000 mg total) by mouth 3 (three) times daily for 7 days.  Dispense: 42 capsule; Refill: 0 - fluticasone (FLONASE) 50 MCG/ACT nasal spray; Place 2 sprays into both nostrils daily.  Dispense: 16 g; Refill: 0    I discussed the assessment and treatment plan with the patient. The patient was provided an opportunity to ask questions and all were answered. The patient agreed with the plan and demonstrated an understanding of the instructions.   The patient was advised to call back or seek an in-person evaluation if the symptoms worsen or if the condition fails to improve as anticipated.  I provided 8 minutes of non-face-to-face time during this encounter.  The entirety of the information documented in the History of Present Illness, Review of Systems and Physical Exam were personally obtained by me. Portions of this information were initially documented by Sharon Bryan, CMA and reviewed by me for thoroughness and accuracy.      Lelon Huh, MD  [facility]

## 2018-10-19 ENCOUNTER — Ambulatory Visit (INDEPENDENT_AMBULATORY_CARE_PROVIDER_SITE_OTHER): Payer: Medicare Other | Admitting: Family Medicine

## 2018-10-19 ENCOUNTER — Other Ambulatory Visit: Payer: Self-pay | Admitting: Family Medicine

## 2018-10-19 ENCOUNTER — Other Ambulatory Visit: Payer: Self-pay

## 2018-10-19 DIAGNOSIS — J0110 Acute frontal sinusitis, unspecified: Secondary | ICD-10-CM

## 2018-10-19 MED ORDER — FLUTICASONE PROPIONATE 50 MCG/ACT NA SUSP: 2.0000 | Freq: Every day | NASAL | 0 refills | Status: DC

## 2018-10-19 MED ORDER — AMOXICILLIN 500 MG PO CAPS: 1000.0000 mg | ORAL_CAPSULE | Freq: Three times a day (TID) | ORAL | 0 refills | Status: DC

## 2018-10-29 NOTE — Patient Instructions (Signed)
.   Please review the attached list of medications and notify my office if there are any errors.   . Please bring all of your medications to every appointment so we can make sure that our medication list is the same as yours.   . It is especially important to get the annual flu vaccine this year. If you haven't had it already, please go to your pharmacy or call the office as soon as possible to schedule you flu shot.  

## 2018-11-01 ENCOUNTER — Other Ambulatory Visit: Payer: Self-pay | Admitting: Family Medicine

## 2018-11-01 NOTE — Telephone Encounter (Signed)
I reviewed Dr. Maralyn Sago note, I don't see any mention of ear pain or findings on his exam to indicate infection. If she just completed antibiotics for a sinus infection, it would be unlikely to have an inner ear infection right after. More likely related to congestion from the drainage. Should continue flonase and can also try xyzal to help with drainage and congestion. Dr. Caryn Section may also review tomorrow when he is in office.

## 2018-11-01 NOTE — Telephone Encounter (Signed)
Patient advised as below.  

## 2018-11-01 NOTE — Telephone Encounter (Signed)
PJ - husband calling for pt saying her sinus infection is clearing up from visit.  Now she's having ear pain and pressure, needing antibiotic drops that have been prescribed before.  Please call into:     [facility] [id number] - [address], [place] [phone] (Phone) [phone] (Fax)   Thanks, [employer]

## 2018-11-02 MED ORDER — NEOMYCIN-POLYMYXIN-HC 3.5-10000-1 OT SUSP: OTIC | 0 refills | Status: DC

## 2018-11-02 NOTE — Telephone Encounter (Signed)
Pt's husband calling back to check on ear drops for pt.  Please call pt back to let him know.  Thanks, [employer]

## 2018-11-02 NOTE — Telephone Encounter (Signed)
Please see if ear is feeling any better today, if not can send prescription for cortisporin to her pharmacy

## 2018-11-02 NOTE — Addendum Note (Signed)
Addended by: Lelon Huh E on: 11/02/2018 11:46 AM   Modules accepted: Orders

## 2018-11-02 NOTE — Telephone Encounter (Signed)
Prescription sent to [facility] in [place]

## 2018-11-02 NOTE — Addendum Note (Signed)
Addended by: Birdie Sons on: 11/02/2018 09:59 AM   Modules accepted: Orders

## 2018-11-06 ENCOUNTER — Other Ambulatory Visit: Payer: Self-pay | Admitting: Family Medicine

## 2018-11-06 DIAGNOSIS — R232 Flushing: Secondary | ICD-10-CM

## 2018-11-13 ENCOUNTER — Other Ambulatory Visit: Payer: Self-pay | Admitting: Family Medicine

## 2018-11-20 ENCOUNTER — Telehealth: Payer: Self-pay | Admitting: Family Medicine

## 2018-11-20 DIAGNOSIS — J0110 Acute frontal sinusitis, unspecified: Secondary | ICD-10-CM

## 2018-11-20 MED ORDER — AMOXICILLIN 500 MG PO CAPS: 1000.0000 mg | ORAL_CAPSULE | Freq: Three times a day (TID) | ORAL | 0 refills | Status: AC

## 2018-11-20 NOTE — Telephone Encounter (Signed)
Pt called saying she was given an antibiotic for a sinus infection a couple of weeks ago.  Most of it got better but her ear is hurting and she is starting to gey a headache again.  She finished the antibiotic about a week ago  Please advise  [phone]  [facility]

## 2018-11-20 NOTE — Telephone Encounter (Signed)
Have sent prescription for amoxicillin to her pharmacy.

## 2018-11-21 NOTE — Telephone Encounter (Signed)
Patient advised as directed below. 

## 2018-12-16 ENCOUNTER — Other Ambulatory Visit: Payer: Self-pay | Admitting: Family Medicine

## 2018-12-17 ENCOUNTER — Other Ambulatory Visit: Payer: Self-pay | Admitting: Family Medicine

## 2018-12-17 DIAGNOSIS — F32A Depression, unspecified: Secondary | ICD-10-CM

## 2018-12-17 DIAGNOSIS — F329 Major depressive disorder, single episode, unspecified: Secondary | ICD-10-CM

## 2018-12-26 ENCOUNTER — Other Ambulatory Visit: Payer: Self-pay | Admitting: Family Medicine

## 2018-12-26 DIAGNOSIS — F329 Major depressive disorder, single episode, unspecified: Secondary | ICD-10-CM

## 2018-12-26 DIAGNOSIS — F32A Depression, unspecified: Secondary | ICD-10-CM

## 2019-01-02 ENCOUNTER — Other Ambulatory Visit: Payer: Self-pay | Admitting: Family Medicine

## 2019-01-12 ENCOUNTER — Other Ambulatory Visit: Payer: Self-pay | Admitting: Family Medicine

## 2019-01-13 NOTE — Telephone Encounter (Signed)
Requested medication (s) are due for refill today: yes  Requested medication (s) are on the active medication list: yes  Last refill:  10/14/2018  Future visit scheduled:no    Notes to clinic:  refill cannot be delegated    Requested Prescriptions  Pending Prescriptions Disp Refills   phenytoin (DILANTIN) 100 MG ER capsule [Pharmacy Med Name: PHENYTOIN SODIUM 100MG  EXT CAPSULES] 180 capsule 4    Sig: TAKE 1 CAPSULE(100 MG) BY MOUTH TWICE DAILY      Not Delegated - Neurology:  Anticonvulsants - phenytoin Failed - 01/12/2019  7:05 AM      Failed - This refill cannot be delegated      Failed - ALT in normal range and within 360 days    ALT  Date Value Ref Range Status  02/04/2016 19 0 - 32 IU/L Final          Failed - AST in normal range and within 360 days    AST  Date Value Ref Range Status  02/04/2016 19 0 - 40 IU/L Final          Failed - HGB in normal range and within 360 days    Hemoglobin  Date Value Ref Range Status  02/04/2016 11.8 11.1 - 15.9 g/dL Final          Failed - HCT in normal range and within 360 days    Hematocrit  Date Value Ref Range Status  02/04/2016 35.6 34.0 - 46.6 % Final          Failed - PLT in normal range and within 360 days    Platelets  Date Value Ref Range Status  02/04/2016 301 150 - 379 x10E3/uL Final          Failed - WBC in normal range and within 360 days    WBC  Date Value Ref Range Status  02/04/2016 7.8 3.4 - 10.8 x10E3/uL Final  09/18/2012 8.9 3.6 - 11.0 x10 3/mm 3 Final          Failed - Phenytoin (serum) in normal range and within 360 days    Phenytoin (Dilantin), Serum  Date Value Ref Range Status  03/08/2016 2.1 (L) 10.0 - 20.0 ug/mL Final    Comment:                                    Detection Limit =  0.8                           <0.8 Indicates None Detected   03/17/2015 2.4 (L) 10.0 - 20.0 ug/mL Final    Comment:                                    Neonatal:  Therapeutic 6.0 - 14.0                                 Detection Limit =  0.8                           <0.8 Indicates None Detected    Phenytoin, Free  Date Value Ref Range Status  08/07/2014 None Detected 1.0 - 2.0 ug/mL Final  Comment:                                    Detection Limit = 0.5          Passed - Valid encounter within last 12 months    Recent Outpatient Visits           2 months ago Acute non-recurrent frontal sinusitis   [facility] Birdie Sons, MD   6 months ago Depressive disorder   [facility] Birdie Sons, MD   7 months ago Depressive disorder   [facility] Birdie Sons, MD   9 months ago Left ear pain   [facility] Birdie Sons, MD   1 year ago Pelvic pain   [facility] Birdie Sons, MD

## 2019-01-15 ENCOUNTER — Other Ambulatory Visit: Payer: Self-pay | Admitting: Family Medicine

## 2019-01-15 DIAGNOSIS — F32A Depression, unspecified: Secondary | ICD-10-CM

## 2019-01-15 DIAGNOSIS — F329 Major depressive disorder, single episode, unspecified: Secondary | ICD-10-CM

## 2019-02-17 ENCOUNTER — Other Ambulatory Visit: Payer: Self-pay | Admitting: Family Medicine

## 2019-02-17 DIAGNOSIS — F329 Major depressive disorder, single episode, unspecified: Secondary | ICD-10-CM

## 2019-02-17 DIAGNOSIS — F32A Depression, unspecified: Secondary | ICD-10-CM

## 2019-03-05 ENCOUNTER — Encounter: Payer: Self-pay | Admitting: Family Medicine

## 2019-03-05 NOTE — Progress Notes (Signed)
Patient: Sharon Bryan Female    DOB: Jul 08, 1965   54 y.o.   MRN: [id number] Visit Date: 03/05/2019  Today's Provider: Lelon Huh, MD   Chief Complaint  Patient presents with  . Depression  . Seizures   Subjective:    Virtual Visit via Video Note  I connected with Sharon Bryan on 03/05/19 at  8:40 AM EST by a video enabled telemedicine application and verified that I am speaking with the correct person using two identifiers.  Location: Patient: home Provider: [facility]   I discussed the limitations of evaluation and management by telemedicine and the availability of in person appointments. The patient expressed understanding and agreed to proceed.       Follow up for Seizure Disorder:  The patient was last seen for this 9 months ago. Changes made at last visit include none.  She reports good compliance with treatment. She feels that condition is Worse. Patient's significant other Sharon Bryan states that patient has had 2 grand mal seizure within the past 6 months. One was on 11/24/2018, and the last one occured a few days ago.  She is not having side effects.   ------------------------------------------------------------------------------------  Follow up for Depressive Disorder:  The patient was last seen for this 8 months ago. Changes made at last visit include none.  She reports good compliance with treatment. She feels that condition is stable. She is not having side effects.   ------------------------------------------------------------------------------------ She also reports she has been extremely fatigued with no energy, and sleepy during the day. She has been having much more stress lately and not sleeping very well, but even when she does sleep through the night she feels exhausted all day.    Allergies  Allergen Reactions  . Codeine     increased heart rate ;choking feeling     Current Outpatient Medications:  .  ALPRAZolam (XANAX) 1  MG tablet, TAKE 1/2 TO 1 TABLET BY MOUTH EVERY 8 HOURS AS NEEDED, Disp: 90 tablet, Rfl: 3 .  amitriptyline (ELAVIL) 25 MG tablet, TAKE 1 TO 2 TABLETS(25 TO 50 MG) BY MOUTH AT BEDTIME, Disp: 60 tablet, Rfl: 11 .  Aspirin-Acetaminophen-Caffeine (EXCEDRIN PO), Take 1 tablet daily as needed by mouth., Disp: , Rfl:  .  buPROPion (WELLBUTRIN XL) 150 MG 24 hr tablet, TAKE 1 TABLET(150 MG) BY MOUTH EVERY MORNING, Disp: 30 tablet, Rfl: 0 .  busPIRone (BUSPAR) 30 MG tablet, TAKE 1 TABLET BY MOUTH TWICE DAILY, Disp: 180 tablet, Rfl: 0 .  estradiol (ESTRACE) 2 MG tablet, TAKE 1 TABLET(2 MG) BY MOUTH DAILY, Disp: 90 tablet, Rfl: 3 .  lamoTRIgine (LAMICTAL) 100 MG tablet, TAKE 1 TABLET BY MOUTH TWICE DAILY, Disp: 180 tablet, Rfl: 4 .  LYRICA 225 MG capsule, TAKE 1 CAPSULE BY MOUTH TWICE DAILY, Disp: 90 capsule, Rfl: 4 .  Magnesium Oxide 420 MG TABS, Take 400 mg by mouth daily., Disp: , Rfl:  .  meloxicam (MOBIC) 15 MG tablet, TAKE 1 TABLET BY MOUTH EVERY DAY AS NEEDED FOR PAIN, Disp: 90 tablet, Rfl: 4 .  Multiple Vitamins-Minerals (CENTRUM SILVER PO), Take by mouth., Disp: , Rfl:  .  omeprazole (PRILOSEC) 20 MG capsule, TAKE 1 CAPSULE BY MOUTH ONCE DAILY, Disp: 30 capsule, Rfl: 12 .  ondansetron (ZOFRAN) 4 MG tablet, Take 1 tablet (4 mg total) every 8 (eight) hours as needed by mouth for nausea or vomiting., Disp: 20 tablet, Rfl: 0 .  PARoxetine (PAXIL) 40 MG tablet, TAKE 1 TABLET BY  MOUTH EVERY DAY, Disp: 90 tablet, Rfl: 4 .  phenytoin (DILANTIN) 100 MG ER capsule, TAKE 1 CAPSULE(100 MG) BY MOUTH TWICE DAILY, Disp: 180 capsule, Rfl: 4 .  fluticasone (FLONASE) 50 MCG/ACT nasal spray, Place 2 sprays into both nostrils daily., Disp: 16 g, Rfl: 0  Review of Systems  Constitutional: Negative for appetite change, chills, fatigue and fever.  Respiratory: Negative for chest tightness and shortness of breath.   Cardiovascular: Negative for chest pain and palpitations.  Gastrointestinal: Negative for abdominal pain,  nausea and vomiting.  Neurological: Positive for seizures. Negative for dizziness and weakness.    Social History   Tobacco Use  . Smoking status: Former Smoker    Quit date: 01/25/2007    Years since quitting: 12.1  . Smokeless tobacco: Never Used  . Tobacco comment: Smoked for about 15 years, smoked about 1 pack a day.  Substance Use Topics  . Alcohol use: Yes    Comment: occasional use; 1-2 times a year      Objective:   LMP 04/26/2002 (LMP Unknown)  There were no vitals filed for this visit.There is no height or weight on file to calculate BMI.   Physical Exam  Awake, alert, oriented x 3. In no apparent distress     Assessment & Plan    1. Seizure disorder ([place]) Will double dose of - phenytoin (DILANTIN) 100 MG ER capsule; Take 2 capsules (200 mg total) by mouth 2 (two) times daily.  Dispense: 360 capsule; Refill: 1  Is to come into lab in 2 weeks for all labs placed as Future order today.   - Dilantin (Phenytoin) level, total; Future - Home sleep test  2. Depressive disorder  3. Hypersomnia  - Home sleep test  4. Malaise and fatigue  - Comprehensive metabolic panel; Future - TSH; Future  5. Iron deficiency anemia, unspecified iron deficiency anemia type  - CBC; Future - Ferritin; Future  6. Encounter for special screening examination for cardiovascular disorder - Lipid panel; Future  7. Morbid obesity ([place])      Lelon Huh, MD  [facility]

## 2019-03-06 ENCOUNTER — Ambulatory Visit (INDEPENDENT_AMBULATORY_CARE_PROVIDER_SITE_OTHER): Payer: Medicare Other | Admitting: Family Medicine

## 2019-03-06 DIAGNOSIS — G40909 Epilepsy, unspecified, not intractable, without status epilepticus: Secondary | ICD-10-CM

## 2019-03-06 DIAGNOSIS — F329 Major depressive disorder, single episode, unspecified: Secondary | ICD-10-CM

## 2019-03-06 DIAGNOSIS — F32A Depression, unspecified: Secondary | ICD-10-CM

## 2019-03-06 DIAGNOSIS — Z136 Encounter for screening for cardiovascular disorders: Secondary | ICD-10-CM

## 2019-03-06 DIAGNOSIS — R5383 Other fatigue: Secondary | ICD-10-CM

## 2019-03-06 DIAGNOSIS — D509 Iron deficiency anemia, unspecified: Secondary | ICD-10-CM | POA: Diagnosis not present

## 2019-03-06 DIAGNOSIS — R5381 Other malaise: Secondary | ICD-10-CM | POA: Diagnosis not present

## 2019-03-06 DIAGNOSIS — G4710 Hypersomnia, unspecified: Secondary | ICD-10-CM | POA: Diagnosis not present

## 2019-03-06 MED ORDER — PHENYTOIN SODIUM EXTENDED 100 MG PO CAPS: 200.0000 mg | ORAL_CAPSULE | Freq: Two times a day (BID) | ORAL | 1 refills | Status: DC

## 2019-03-13 ENCOUNTER — Other Ambulatory Visit: Payer: Self-pay | Admitting: Family Medicine

## 2019-03-19 ENCOUNTER — Other Ambulatory Visit: Payer: Self-pay | Admitting: Family Medicine

## 2019-03-22 ENCOUNTER — Other Ambulatory Visit: Payer: Self-pay | Admitting: Family Medicine

## 2019-03-22 DIAGNOSIS — F329 Major depressive disorder, single episode, unspecified: Secondary | ICD-10-CM

## 2019-03-22 DIAGNOSIS — F32A Depression, unspecified: Secondary | ICD-10-CM

## 2019-03-22 MED ORDER — PREGABALIN 225 MG PO CAPS: 225.0000 mg | ORAL_CAPSULE | Freq: Two times a day (BID) | ORAL | 4 refills | Status: DC

## 2019-03-25 ENCOUNTER — Encounter: Payer: Self-pay | Admitting: Family Medicine

## 2019-03-25 ENCOUNTER — Telehealth: Payer: Self-pay | Admitting: Family Medicine

## 2019-03-25 NOTE — Telephone Encounter (Signed)
pregabalin (LYRICA) 225 MG capsule    Patient's husband stated that pharmacy was to contact office to request prior auth on this medication. He was calling to check status. Please advise.

## 2019-03-26 ENCOUNTER — Telehealth: Payer: Self-pay | Admitting: Family Medicine

## 2019-03-26 ENCOUNTER — Telehealth: Payer: Self-pay

## 2019-03-26 MED ORDER — LYRICA 225 MG PO CAPS: 225.0000 mg | ORAL_CAPSULE | Freq: Two times a day (BID) | ORAL | 3 refills | Status: DC

## 2019-03-26 NOTE — Telephone Encounter (Signed)
Copied from [place] [phone]. Topic: General - Other >> Mar 26, 2019 12:24 PM Rainey Pines A wrote: Patients spouse requesting callback with a status update on Lyrica prior authorization. Patient is completely out of medication. Please advise

## 2019-03-26 NOTE — Telephone Encounter (Addendum)
Sent prescription for generic pregabalin, but patient sent mychart message stating she cannot take generic. Will need to due PA for brand name pre-gabalin

## 2019-03-26 NOTE — Addendum Note (Signed)
Addended by: Birdie Sons on: 03/26/2019 01:56 PM   Modules accepted: Orders

## 2019-03-26 NOTE — Telephone Encounter (Signed)
Patient's husband advised PA has been received and is being completed by Dr. Caryn Section. Husband also advised PA decisions can take up to 72 hours.

## 2019-03-26 NOTE — Telephone Encounter (Signed)
Prior auth submitted on CoverMyMeds 03/26/2019

## 2019-03-27 ENCOUNTER — Encounter: Payer: Self-pay | Admitting: Family Medicine

## 2019-03-27 ENCOUNTER — Telehealth: Payer: Self-pay

## 2019-03-27 NOTE — Telephone Encounter (Signed)
Copied from [place] [phone]. Topic: General - Other >> Mar 27, 2019 12:39 PM Mcneil, Ja-Kwan wrote: Reason for CRM: Pt husband stated they were told by the pharmacy that the Rx for LYRICA 225 MG capsule was denied. Pt husband requests call back. Cb# [phone]

## 2019-03-28 NOTE — Telephone Encounter (Signed)
There is a fax in the Media tab from 03-26-2019 from [facility] stating it was approved.

## 2019-03-28 NOTE — Telephone Encounter (Signed)
Call to patient- she states the pharmacy has been trying to run Rx through without success. Call to pharmacy- verified that patient has PA- they are getting message that it is too soon- 3/6 is when can be filled- they are going to reach out to insurance for reason since dating is wrong per their records. They have agreed to call patient with response.

## 2019-03-28 NOTE — Telephone Encounter (Signed)
Tried calling patient to advise her of this. Left message to call back. OK for [facility] to advise of message. I also sent patient a message via [place] her that the PA was approved.

## 2019-04-02 ENCOUNTER — Other Ambulatory Visit: Payer: Self-pay | Admitting: Family Medicine

## 2019-04-02 NOTE — Telephone Encounter (Signed)
Requested Prescriptions  Pending Prescriptions Disp Refills  . busPIRone (BUSPAR) 30 MG tablet [Pharmacy Med Name: BUSPIRONE 30MG  TABLETS] 180 tablet 1    Sig: TAKE 1 TABLET BY MOUTH TWICE DAILY     Psychiatry: Anxiolytics/Hypnotics - Non-controlled Passed - 04/02/2019  3:25 AM      Passed - Valid encounter within last 6 months    Recent Outpatient Visits          3 weeks ago Seizure disorder [facility])   [facility] Birdie Sons, MD   5 months ago Acute non-recurrent frontal sinusitis   [facility] Birdie Sons, MD   9 months ago Depressive disorder   [facility] Birdie Sons, MD   10 months ago Depressive disorder   [facility] Birdie Sons, MD   11 months ago Left ear pain   [facility] Birdie Sons, MD

## 2019-04-05 ENCOUNTER — Telehealth: Payer: Self-pay | Admitting: Family Medicine

## 2019-04-05 NOTE — Telephone Encounter (Signed)
LMTCB, okay for PEC to ask patient and routing back to the office for lab orders

## 2019-04-05 NOTE — Telephone Encounter (Signed)
Patient had labs ordered in February regarding dilantin levels, blood count and metabolic. We still haven't received results. Please check and see when she is going to be unable to stop by labs.  Original orders expired and will need to be re-entered.

## 2019-04-08 ENCOUNTER — Telehealth: Payer: Self-pay | Admitting: Family Medicine

## 2019-04-08 NOTE — Addendum Note (Signed)
Addended by: Fransisca Connors A on: 04/08/2019 05:11 PM   Modules accepted: Level of Service, SmartSet

## 2019-04-08 NOTE — Telephone Encounter (Signed)
Attempted to contact patient. Left VM to return call to office. 

## 2019-04-08 NOTE — Telephone Encounter (Signed)
This encounter was created in error - please disregard.

## 2019-04-08 NOTE — Telephone Encounter (Signed)
LMTCB

## 2019-04-08 NOTE — Telephone Encounter (Signed)
Attempted to contact patient. Left VM for her to return call to office. Patient needs labs.

## 2019-04-09 NOTE — Telephone Encounter (Signed)
Letter mailed to address in chart requesting patient call the office regarding labs.

## 2019-04-15 DIAGNOSIS — Z23 Encounter for immunization: Secondary | ICD-10-CM | POA: Diagnosis not present

## 2019-04-17 ENCOUNTER — Other Ambulatory Visit: Payer: Self-pay | Admitting: Family Medicine

## 2019-04-17 NOTE — Telephone Encounter (Signed)
Requested medication (s) are due for refill today: Yes  Requested medication (s) are on the active medication list: Yes  Last refill:  02/02/18  Future visit scheduled: No  Notes to clinic:  Prescription has expired.    Requested Prescriptions  Pending Prescriptions Disp Refills   lamoTRIgine (LAMICTAL) 100 MG tablet [Pharmacy Med Name: LAMOTRIGINE 100MG  TABLETS] 180 tablet 4    Sig: TAKE 1 TABLET BY MOUTH TWICE DAILY      Not Delegated - Neurology:  Anticonvulsants Failed - 04/17/2019 12:58 PM      Failed - This refill cannot be delegated      Failed - HCT in normal range and within 360 days    Hematocrit  Date Value Ref Range Status  02/04/2016 35.6 34.0 - 46.6 % Final          Failed - HGB in normal range and within 360 days    Hemoglobin  Date Value Ref Range Status  02/04/2016 11.8 11.1 - 15.9 g/dL Final          Failed - PLT in normal range and within 360 days    Platelets  Date Value Ref Range Status  02/04/2016 301 150 - 379 x10E3/uL Final          Failed - WBC in normal range and within 360 days    WBC  Date Value Ref Range Status  02/04/2016 7.8 3.4 - 10.8 x10E3/uL Final  09/18/2012 8.9 3.6 - 11.0 x10 3/mm 3 Final          Passed - Valid encounter within last 12 months    Recent Outpatient Visits           1 month ago Seizure disorder [facility]   [facility] Birdie Sons, MD   6 months ago Acute non-recurrent frontal sinusitis   [facility] Birdie Sons, MD   9 months ago Depressive disorder   [facility] Birdie Sons, MD   10 months ago Depressive disorder   [place], Kirstie Peri, MD   12 months ago Left ear pain   [facility] Birdie Sons, MD

## 2019-04-17 NOTE — Telephone Encounter (Signed)
LOV  03/06/19  LRF  02/02/18   #180 x 4

## 2019-04-29 ENCOUNTER — Telehealth: Payer: Self-pay | Admitting: Family Medicine

## 2019-04-29 NOTE — Telephone Encounter (Signed)
Left message for patient to schedule Annual Wellness Visit.  Please schedule with Nurse Health Advisor Victoria Britt, RN at [place]  

## 2019-05-05 ENCOUNTER — Other Ambulatory Visit: Payer: Self-pay | Admitting: Family Medicine

## 2019-05-06 NOTE — Telephone Encounter (Signed)
Requested Prescriptions  Pending Prescriptions Disp Refills  . omeprazole (PRILOSEC) 20 MG capsule [Pharmacy Med Name: OMEPRAZOLE 20MG  CAPSULES] 30 capsule 2    Sig: TAKE 1 CAPSULE BY MOUTH EVERY DAY     Gastroenterology: Proton Pump Inhibitors Passed - 05/05/2019 10:43 PM      Passed - Valid encounter within last 12 months    Recent Outpatient Visits          2 months ago Seizure disorder [facility])   [facility] Birdie Sons, MD   6 months ago Acute non-recurrent frontal sinusitis   [facility] Birdie Sons, MD   10 months ago Depressive disorder   [facility] Birdie Sons, MD   11 months ago Depressive disorder   [facility] Birdie Sons, MD   1 year ago Left ear pain   [facility] Birdie Sons, MD

## 2019-05-13 DIAGNOSIS — Z23 Encounter for immunization: Secondary | ICD-10-CM | POA: Diagnosis not present

## 2019-06-11 ENCOUNTER — Encounter: Payer: Self-pay | Admitting: Physician Assistant

## 2019-06-11 ENCOUNTER — Other Ambulatory Visit: Payer: Self-pay

## 2019-06-11 ENCOUNTER — Ambulatory Visit (INDEPENDENT_AMBULATORY_CARE_PROVIDER_SITE_OTHER): Payer: Medicare Other | Admitting: Physician Assistant

## 2019-06-11 VITALS — BP 154/97 | HR 86 | Temp 97.1°F | Ht 62.0 in | Wt 280.0 lb

## 2019-06-11 DIAGNOSIS — D2230 Melanocytic nevi of unspecified part of face: Secondary | ICD-10-CM | POA: Diagnosis not present

## 2019-06-11 NOTE — Patient Instructions (Addendum)
Contave, Topamax, Saxenda, Belviq, Qsymia.  Obesity, Adult Obesity is having too much body fat. Being obese means that your weight is more than what is healthy for you. BMI is a number that explains how much body fat you have. If you have a BMI of 30 or more, you are obese. Obesity is often caused by eating or drinking more calories than your body uses. Changing your lifestyle can help you lose weight. Obesity can cause serious health problems, such as:  Stroke.  Coronary artery disease (CAD).  Type 2 diabetes.  Some types of cancer, including cancers of the colon, breast, uterus, and gallbladder.  Osteoarthritis.  High blood pressure (hypertension).  High cholesterol.  Sleep apnea.  Gallbladder stones.  Infertility problems. What are the causes?  Eating meals each day that are high in calories, sugar, and fat.  Being born with genes that may make you more likely to become obese.  Having a medical condition that causes obesity.  Taking certain medicines.  Sitting a lot (having a sedentary lifestyle).  Not getting enough sleep.  Drinking a lot of drinks that have sugar in them. What increases the risk?  Having a family history of obesity.  Being an [place] American woman.  Being a Hispanic man.  Living in an area with limited access to: ? Romilda Garret, recreation centers, or sidewalks. ? Healthy food choices, such as grocery stores and farmers' markets. What are the signs or symptoms? The main sign is having too much body fat. How is this treated?  Treatment for this condition often includes changing your lifestyle. Treatment may include: ? Changing your diet. This may include making a healthy meal plan. ? Exercise. This may include activity that causes your heart to beat faster (aerobic exercise) and strength training. Work with your doctor to design a program that works for you. ? Medicine to help you lose weight. This may be used if you are not able to lose 1  pound a week after 6 weeks of healthy eating and more exercise. ? Treating conditions that cause the obesity. ? Surgery. Options may include gastric banding and gastric bypass. This may be done if:  Other treatments have not helped to improve your condition.  You have a BMI of 40 or higher.  You have life-threatening health problems related to obesity. Follow these instructions at home: Eating and drinking   Follow advice from your doctor about what to eat and drink. Your doctor may tell you to: ? Limit fast food, sweets, and processed snack foods. ? Choose low-fat options. For example, choose low-fat milk instead of whole milk. ? Eat 5 or more servings of fruits or vegetables each day. ? Eat at home more often. This gives you more control over what you eat. ? Choose healthy foods when you eat out. ? Learn to read food labels. This will help you learn how much food is in 1 serving. ? Keep low-fat snacks available. ? Avoid drinks that have a lot of sugar in them. These include soda, fruit juice, iced tea with sugar, and flavored milk.  Drink enough water to keep your pee (urine) pale yellow.  Do not go on fad diets. Physical activity  Exercise often, as told by your doctor. Most adults should get up to 150 minutes of moderate-intensity exercise every week.Ask your doctor: ? What types of exercise are safe for you. ? How often you should exercise.  Warm up and stretch before being active.  Do slow stretching after being  active (cool down).  Rest between times of being active. Lifestyle  Work with your doctor and a food expert (dietitian) to set a weight-loss goal that is best for you.  Limit your screen time.  Find ways to reward yourself that do not involve food.  Do not drink alcohol if: ? Your doctor tells you not to drink. ? You are pregnant, may be pregnant, or are planning to become pregnant.  If you drink alcohol: ? Limit how much you use to:  0-1 drink a day  for women.  0-2 drinks a day for men. ? Be aware of how much alcohol is in your drink. In the [place] one drink equals one 12 oz bottle of beer (355 mL), one 5 oz glass of wine (148 mL), or one 1 oz glass of hard liquor (44 mL). General instructions  Keep a weight-loss journal. This can help you keep track of: ? The food that you eat. ? How much exercise you get.  Take over-the-counter and prescription medicines only as told by your doctor.  Take vitamins and supplements only as told by your doctor.  Think about joining a support group.  Keep all follow-up visits as told by your doctor. This is important. Contact a doctor if:  You cannot meet your weight loss goal after you have changed your diet and lifestyle for 6 weeks. Get help right away if you:  Are having trouble breathing.  Are having thoughts of harming yourself. Summary  Obesity is having too much body fat.  Being obese means that your weight is more than what is healthy for you.  Work with your doctor to set a weight-loss goal.  Get regular exercise as told by your doctor. This information is not intended to replace advice given to you by your health care provider. Make sure you discuss any questions you have with your health care provider. Document Revised: 09/14/2017 Document Reviewed: 09/14/2017 Elsevier Patient Education  2020 [employer].

## 2019-06-11 NOTE — Assessment & Plan Note (Signed)
Will refer to Dermatology to evaluate and treat.

## 2019-06-11 NOTE — Progress Notes (Signed)
I,Laura E Walsh,acting as a Education administrator for [employer], PA-C.,have documented all relevant documentation on the behalf of Trinna Post, PA-C,as directed by  Trinna Post, PA-C while in the presence of Trinna Post, PA-C.   Established patient visit   Patient: Sharon Bryan   DOB: 1965/10/17   54 y.o. Female  MRN: [id number] Visit Date: 06/11/2019  Today's healthcare provider: Trinna Post, PA-C   Chief Complaint  Patient presents with  . Nevus    Pt reports a mole on her face is becoming bigger and painful.    Subjective    HPI   Pt comes in today to have a mole on her left jaw line evaluated.  She states over the last several weeks it has become larger and slightly painful.    Pt would also like to discuss weight loss options.  She states she has steadily gained weight since her brain surgery several years ago.  She has lost 10 pounds in the last month.   Typical Menu:   Breakfast: Yogurt  Lunch:  Yogurt and fruit.  Dinner:  Pt states she prepares a "Southern home cooked meal," including fried chicken, steak, potatoes, and Mac and Cheese.    Patient Active Problem List   Diagnosis Date Noted  . Change in facial mole 06/11/2019  . Encounter for screening colonoscopy   . Dyspnea 03/17/2015  . Hyperkeratosis 02/13/2015  . Dizziness 08/04/2014  . Dysphasia 08/04/2014  . GERD (gastroesophageal reflux disease) 08/04/2014  . Goiter 08/04/2014  . Arthralgia of hip 08/04/2014  . Hot flashes 08/04/2014  . Menopause 08/04/2014  . Migraine 08/04/2014  . Numbness and tingling of right arm and leg 08/04/2014  . Morbid obesity ([place]) 08/04/2014  . Fibromyalgia 07/18/2014  . Allergic rhinitis 07/18/2014  . Thyroid cyst 10/27/2010  . Malaise and fatigue 12/17/2008  . Anemia, iron deficiency 09/02/2008  . Anxiety disorder 09/02/2008  . Depressive disorder 09/02/2008  . Seizure disorder ([place]) 07/08/2008  . Epilepsy ([place]) 11/10/2003  . 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 Allergy   . Anemia, iron deficiency 09/02/2008  . 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 Bell's palsy   . Fibromyalgia    (worsening)  . Menopause 2015  . Migraine   . Numbness and tingling of right arm and leg   . Sciatica 08/04/2014  . Seizure disorder ([place])   . Thyroid cyst 10/27/2010   Past Surgical History:  Procedure Laterality Date  . ABDOMINAL HYSTERECTOMY  2004   partial, ovaries remain  . CESAREAN SECTION  1998  . COLONOSCOPY WITH PROPOFOL N/A 11/07/2017   Procedure: COLONOSCOPY WITH PROPOFOL;  Surgeon: Lin Landsman, MD;  Location: [facility] END[facility]COPY;  Service: Gastroenterology;  Laterality: N/A;  . CRANIOTOMY  2005   for artetiovenius malformation  . TUBAL LIGATION  1998   Social History   Tobacco Use  . Smoking status: Former Smoker    Quit date: 01/25/2007    Years since quitting: 12.3  . Smokeless tobacco: Never Used  . Tobacco comment: Smoked for about 15 years, smoked about 1 pack a day.  Substance Use Topics  . Alcohol use: Yes    Comment: occasional use; 1-2 times a year  . Drug use: No   Allergies  Allergen Reactions  . Codeine     increased heart rate ;choking feeling       Medications: Outpatient Medications Prior to Visit  Medication Sig  . ALPRAZolam (XANAX) 1 MG tablet TAKE 1/2 TO 1 TABLET BY MOUTH EVERY 8 HOURS AS NEEDED  . amitriptyline (ELAVIL) 25 MG tablet TAKE 1 TO 2 TABLETS(25 TO 50 MG) BY MOUTH AT BEDTIME  . Aspirin-Acetaminophen-Caffeine (EXCEDRIN PO) Take 1 tablet daily as needed by mouth.  Marland Kitchen buPROPion (WELLBUTRIN XL) 150 MG 24 hr tablet TAKE 1 TABLET(150 MG) BY MOUTH EVERY MORNING  . busPIRone (BUSPAR) 30 MG tablet TAKE 1 TABLET BY MOUTH TWICE DAILY  . estradiol (ESTRACE) 2 MG tablet TAKE 1 TABLET(2 MG) BY MOUTH DAILY  . lamoTRIgine (LAMICTAL) 100 MG tablet TAKE 1 TABLET BY MOUTH TWICE DAILY  . LYRICA 225 MG capsule Take 1  capsule (225 mg total) by mouth 2 (two) times daily. BRAND NAME MEDICALLY NECESSARY  . Magnesium Oxide 420 MG TABS Take 400 mg by mouth daily.  . meloxicam (MOBIC) 15 MG tablet TAKE 1 TABLET BY MOUTH EVERY DAY AS NEEDED FOR PAIN  . Multiple Vitamins-Minerals (CENTRUM SILVER PO) Take by mouth.  Marland Kitchen omeprazole (PRILOSEC) 20 MG capsule TAKE 1 CAPSULE BY MOUTH EVERY DAY  . ondansetron (ZOFRAN) 4 MG tablet Take 1 tablet (4 mg total) every 8 (eight) hours as needed by mouth for nausea or vomiting.  Marland Kitchen PARoxetine (PAXIL) 40 MG tablet TAKE 1 TABLET BY MOUTH EVERY DAY  . phenytoin (DILANTIN) 100 MG ER capsule Take 2 capsules (200 mg total) by mouth 2 (two) times daily.  . fluticasone (FLONASE) 50 MCG/ACT nasal spray Place 2 sprays into both nostrils daily.   No facility-administered medications prior to visit.    Review of Systems  Constitutional: Negative.   Skin: Negative for color change, pallor, rash and wound.      Objective    BP (!) 154/97 (BP Location: Left Wrist, Patient Position: Sitting, Cuff Size: Normal)   Pulse 86   Temp (!) 97.1 F (36.2 C) (Temporal)   Ht _0  (1.575 m)   Wt 280 lb (127 kg)   LMP 04/26/2002 (LMP Unknown)   BMI 51.21 kg/m    Physical Exam Constitutional:      Appearance: Normal appearance.  Skin:    General: Skin is warm and dry.     Comments: Mole noted on left jaw line.   Neurological:     Mental Status: She is alert and oriented to person, place, and time. Mental status is at baseline.  Psychiatric:        Mood and Affect: Mood normal.        Behavior: Behavior normal.        Thought Content: Thought content normal.        Judgment: Judgment normal.       No results found for any visits on 06/11/19.  Assessment & Plan     Problem List Items Addressed This Visit      Musculoskeletal and Integument   Change in facial mole - Primary    Will refer to Dermatology to evaluate and treat.       Relevant Orders   Ambulatory referral to  Dermatology     Other   Morbid obesity [facility])    Discussed in detail the importance of a healthy lifestyle including diet and exercise. Congratulated on 10 pound weight loss recently.  Pt is going to continue working on diet especially with her evening meal.  Pt to research medication options.              Return if symptoms worsen or  fail to improve.      ITrinna Post, PA-C, have reviewed all documentation for this visit. The documentation on 06/11/19 for the exam, diagnosis, procedures, and orders are all accurate and complete.    Paulene Floor  [facility] [phone] (phone) [phone] (fax)  [place]

## 2019-06-11 NOTE — Assessment & Plan Note (Signed)
Discussed in detail the importance of a healthy lifestyle including diet and exercise. Congratulated on 10 pound weight loss recently.  Pt is going to continue working on diet especially with her evening meal.  Pt to research medication options.

## 2019-06-30 ENCOUNTER — Other Ambulatory Visit: Payer: Self-pay | Admitting: Family Medicine

## 2019-07-26 ENCOUNTER — Other Ambulatory Visit: Payer: Self-pay | Admitting: Family Medicine

## 2019-07-26 NOTE — Telephone Encounter (Signed)
Requested medications are due for refill today?  Yes - This medication refill cannot be delegated.    Requested medications are on active medication list?  Yes  Last Refill:   03/20/2019 # 90 with 3 refills  Future visit scheduled?  No   Notes to Clinic:  This medication refill cannot be delegated.

## 2019-08-27 NOTE — Progress Notes (Signed)
This encounter was created in error - please disregard.

## 2019-08-28 ENCOUNTER — Other Ambulatory Visit: Payer: Self-pay

## 2019-09-02 ENCOUNTER — Other Ambulatory Visit: Payer: Self-pay | Admitting: Family Medicine

## 2019-09-02 DIAGNOSIS — F32A Depression, unspecified: Secondary | ICD-10-CM

## 2019-09-21 ENCOUNTER — Other Ambulatory Visit: Payer: Self-pay | Admitting: Family Medicine

## 2019-09-22 NOTE — Telephone Encounter (Signed)
Requested  medications are  due for refill today yes  Requested medications are on the active medication list yes  Last refill 5/18  Last visit Do not see a valid visit that addresses med   Notes to clinic Did not find valid visit within 12 months

## 2019-09-26 ENCOUNTER — Other Ambulatory Visit: Payer: Self-pay | Admitting: Family Medicine

## 2019-09-26 NOTE — Telephone Encounter (Signed)
  Notes to clinic Do not see a visit that address this med/dx, please assess.

## 2019-09-27 ENCOUNTER — Other Ambulatory Visit: Payer: Self-pay | Admitting: Family Medicine

## 2019-09-27 NOTE — Telephone Encounter (Signed)
Requested medication (s) are due for refill today: yes  Requested medication (s) are on the active medication list: yes  Last refill:  03/26/19 #180 3 refills   Future visit scheduled: no  Notes to clinic:   not delegated per protocol     Requested Prescriptions  Pending Prescriptions Disp Refills   pregabalin (LYRICA) 225 MG capsule [Pharmacy Med Name: PREGABALIN 225MG  CAPSULES] 180 capsule     Sig: TAKE 1 CAPSULE(225 MG) BY MOUTH TWICE DAILY      Not Delegated - Neurology:  Anticonvulsants - Controlled Failed - 09/27/2019  5:54 PM      Failed - This refill cannot be delegated      Passed - Valid encounter within last 12 months    Recent Outpatient Visits           3 months ago Change in facial mole   [place], [place], PA-C   6 months ago Seizure disorder [facility])   [facility] Birdie Sons, MD   11 months ago Acute non-recurrent frontal sinusitis   [facility] Birdie Sons, MD   1 year ago Depressive disorder   [facility] Birdie Sons, MD   1 year ago Depressive disorder   [place], Kirstie Peri, MD

## 2019-09-29 ENCOUNTER — Other Ambulatory Visit: Payer: Self-pay | Admitting: Family Medicine

## 2019-09-29 MED ORDER — LYRICA 225 MG PO CAPS: 225.0000 mg | ORAL_CAPSULE | Freq: Two times a day (BID) | ORAL | 0 refills | Status: DC

## 2019-10-01 ENCOUNTER — Other Ambulatory Visit: Payer: Self-pay | Admitting: Family Medicine

## 2019-10-07 NOTE — Progress Notes (Deleted)
     Established patient visit   Patient: Sharon Bryan   DOB: 17-May-1965   54 y.o. Female  MRN: [id number] Visit Date: 10/08/2019  Today's healthcare provider: Lelon Huh, MD   No chief complaint on file.  Subjective    Dysuria  This is a new problem.      {Show patient history (optional):23778::" "}   Medications: Outpatient Medications Prior to Visit  Medication Sig  . ALPRAZolam (XANAX) 1 MG tablet TAKE 1/2 TO 1 TABLET BY MOUTH EVERY 8 HOURS AS NEEDED  . amitriptyline (ELAVIL) 25 MG tablet TAKE 1 TO 2 TABLETS(25 TO 50 MG) BY MOUTH AT BEDTIME  . Aspirin-Acetaminophen-Caffeine (EXCEDRIN PO) Take 1 tablet daily as needed by mouth.  Marland Kitchen buPROPion (WELLBUTRIN XL) 150 MG 24 hr tablet TAKE 1 TABLET(150 MG) BY MOUTH EVERY MORNING  . busPIRone (BUSPAR) 30 MG tablet TAKE 1 TABLET BY MOUTH TWICE DAILY  . estradiol (ESTRACE) 2 MG tablet TAKE 1 TABLET(2 MG) BY MOUTH DAILY  . fluticasone (FLONASE) 50 MCG/ACT nasal spray Place 2 sprays into both nostrils daily.  Marland Kitchen lamoTRIgine (LAMICTAL) 100 MG tablet TAKE 1 TABLET BY MOUTH TWICE DAILY  . LYRICA 225 MG capsule Take 1 capsule (225 mg total) by mouth 2 (two) times daily. BRAND NAME MEDICALLY NECESSARY  . Magnesium Oxide 420 MG TABS Take 400 mg by mouth daily.  . meloxicam (MOBIC) 15 MG tablet TAKE 1 TABLET BY MOUTH EVERY DAY AS NEEDED FOR PAIN  . Multiple Vitamins-Minerals (CENTRUM SILVER PO) Take by mouth.  Marland Kitchen omeprazole (PRILOSEC) 20 MG capsule TAKE 1 CAPSULE BY MOUTH EVERY DAY  . ondansetron (ZOFRAN) 4 MG tablet Take 1 tablet (4 mg total) every 8 (eight) hours as needed by mouth for nausea or vomiting.  Marland Kitchen PARoxetine (PAXIL) 40 MG tablet TAKE 1 TABLET BY MOUTH EVERY DAY  . phenytoin (DILANTIN) 100 MG ER capsule Take 2 capsules (200 mg total) by mouth 2 (two) times daily.   No facility-administered medications prior to visit.    Review of Systems  Genitourinary: Positive for dysuria.    {Heme  Chem  Endocrine  Serology   Results Review (optional):23779::" "}  Objective    LMP 04/26/2002 (LMP Unknown)  {Show previous vital signs (optional):23777::" "}  Physical Exam  ***  No results found for any visits on 10/08/19.  Assessment & Plan     ***  No follow-ups on file.      {provider attestation***:1}   Lelon Huh, MD  [facility] [phone] (phone) [phone] (fax)  [place]

## 2019-10-08 ENCOUNTER — Ambulatory Visit: Payer: Medicare Other | Admitting: Family Medicine

## 2019-10-09 ENCOUNTER — Telehealth: Payer: Self-pay | Admitting: Family Medicine

## 2019-10-09 NOTE — Telephone Encounter (Signed)
Copied from [place] [id number]. Topic: Medicare AWV >> Oct 09, 2019  3:22 PM Cher Nakai R wrote: Reason for CRM:  Left message for patient to call back and schedule Medicare Annual Wellness Visit (AWV) either virtually or in office.  No hx of AWV; please schedule at anytime with [facility] Advisor.

## 2019-10-10 ENCOUNTER — Other Ambulatory Visit: Payer: Self-pay | Admitting: Family Medicine

## 2019-10-10 NOTE — Telephone Encounter (Signed)
Requested medication (s) are due for refill today: yes  Requested medication (s) are on the active medication list:yes  Last refill:  08/26/2019  Future visit scheduled: yes  Notes to clinic: this refill cannot be delegated    Requested Prescriptions  Pending Prescriptions Disp Refills   ALPRAZolam (XANAX) 1 MG tablet [Pharmacy Med Name: ALPRAZOLAM 1MG  TABLETS] 90 tablet     Sig: TAKE 1/2 TO 1 TABLET BY MOUTH EVERY 8 HOURS AS NEEDED      Not Delegated - Psychiatry:  Anxiolytics/Hypnotics Failed - 10/10/2019  8:01 PM      Failed - This refill cannot be delegated      Failed - Urine Drug Screen completed in last 360 days.      Passed - Valid encounter within last 6 months    Recent Outpatient Visits           4 months ago Change in facial mole   [place], [place], [place]   7 months ago Seizure disorder [facility])   [facility] Birdie Sons, MD   11 months ago Acute non-recurrent frontal sinusitis   [facility] Birdie Sons, MD   1 year ago Depressive disorder   [facility] Birdie Sons, MD   1 year ago Depressive disorder   [place], Kirstie Peri, MD       Future Appointments             In 4 days [place], Kirstie Peri, MD [facility], [place]

## 2019-10-14 ENCOUNTER — Ambulatory Visit: Payer: Medicare Other | Admitting: Family Medicine

## 2019-11-04 ENCOUNTER — Other Ambulatory Visit: Payer: Self-pay

## 2019-11-04 ENCOUNTER — Other Ambulatory Visit: Payer: Self-pay | Admitting: Family Medicine

## 2019-11-04 ENCOUNTER — Encounter: Payer: Self-pay | Admitting: Family Medicine

## 2019-11-04 ENCOUNTER — Ambulatory Visit (INDEPENDENT_AMBULATORY_CARE_PROVIDER_SITE_OTHER): Payer: Medicare Other | Admitting: Family Medicine

## 2019-11-04 VITALS — BP 126/79 | HR 73 | Temp 100.0°F | Resp 16 | Ht 62.0 in | Wt 273.0 lb

## 2019-11-04 DIAGNOSIS — R635 Abnormal weight gain: Secondary | ICD-10-CM

## 2019-11-04 DIAGNOSIS — Z136 Encounter for screening for cardiovascular disorders: Secondary | ICD-10-CM

## 2019-11-04 DIAGNOSIS — N3000 Acute cystitis without hematuria: Secondary | ICD-10-CM | POA: Diagnosis not present

## 2019-11-04 DIAGNOSIS — G40909 Epilepsy, unspecified, not intractable, without status epilepticus: Secondary | ICD-10-CM | POA: Diagnosis not present

## 2019-11-04 DIAGNOSIS — G40009 Localization-related (focal) (partial) idiopathic epilepsy and epileptic syndromes with seizures of localized onset, not intractable, without status epilepticus: Secondary | ICD-10-CM | POA: Diagnosis not present

## 2019-11-04 LAB — POCT URINALYSIS DIPSTICK
Bilirubin, UA: NEGATIVE
Blood, UA: NEGATIVE
Glucose, UA: NEGATIVE
Ketones, UA: NEGATIVE
Leukocytes, UA: NEGATIVE
Nitrite, UA: NEGATIVE
Protein, UA: NEGATIVE
Spec Grav, UA: >=1.03 — AB (ref 1.010–1.025)
Urobilinogen, UA: 0.2 E.U./dL
pH, UA: 7 (ref 5.0–8.0)

## 2019-11-04 NOTE — Progress Notes (Signed)
Established patient visit   Patient: Sharon Bryan   DOB: [birth year]   54 y.o. Female  MRN: [id number] Visit Date: 11/04/2019  Today's healthcare provider: Lelon Huh, MD   Chief Complaint  Patient presents with  . Dysuria   Subjective    HPI HPI    6 weeks of burning urination off and on and waking at night with constant urinating but only a little amount.    Last edited by Lazarus Gowda, CMA on 11/04/2019 11:34 AM. (History)      She is also due for follow up siezure disorder depression and anxiety. She report her last seizure was over a year ago and is going well with current medication regiment. She feels her mood has been very good and is working on losing weight with healthier diet and exercising regularly.      Medications: Outpatient Medications Prior to Visit  Medication Sig  . ALPRAZolam (XANAX) 1 MG tablet TAKE 1/2 TO 1 TABLET BY MOUTH EVERY 8 HOURS AS NEEDED  . amitriptyline (ELAVIL) 25 MG tablet TAKE 1 TO 2 TABLETS(25 TO 50 MG) BY MOUTH AT BEDTIME  . Aspirin-Acetaminophen-Caffeine (EXCEDRIN PO) Take 1 tablet daily as needed by mouth.  Marland Kitchen buPROPion (WELLBUTRIN XL) 150 MG 24 hr tablet TAKE 1 TABLET(150 MG) BY MOUTH EVERY MORNING  . busPIRone (BUSPAR) 30 MG tablet TAKE 1 TABLET BY MOUTH TWICE DAILY  . estradiol (ESTRACE) 2 MG tablet TAKE 1 TABLET(2 MG) BY MOUTH DAILY  . fluticasone (FLONASE) 50 MCG/ACT nasal spray Place 2 sprays into both nostrils daily.  Marland Kitchen lamoTRIgine (LAMICTAL) 100 MG tablet TAKE 1 TABLET BY MOUTH TWICE DAILY  . LYRICA 225 MG capsule Take 1 capsule (225 mg total) by mouth 2 (two) times daily. BRAND NAME MEDICALLY NECESSARY  . Magnesium Oxide 420 MG TABS Take 400 mg by mouth daily.  . meloxicam (MOBIC) 15 MG tablet TAKE 1 TABLET BY MOUTH EVERY DAY AS NEEDED FOR PAIN  . Multiple Vitamins-Minerals (CENTRUM SILVER PO) Take by mouth.  Marland Kitchen omeprazole (PRILOSEC) 20 MG capsule TAKE 1 CAPSULE BY MOUTH EVERY DAY  . ondansetron (ZOFRAN) 4 MG  tablet Take 1 tablet (4 mg total) every 8 (eight) hours as needed by mouth for nausea or vomiting.  Marland Kitchen PARoxetine (PAXIL) 40 MG tablet TAKE 1 TABLET BY MOUTH EVERY DAY  . phenytoin (DILANTIN) 100 MG ER capsule Take 2 capsules (200 mg total) by mouth 2 (two) times daily.   No facility-administered medications prior to visit.   ROS No chest pain, palpitations, fevers, chills, sweats, back or abdominal pain.     Objective    BP 126/79 (BP Location: Left Arm, Patient Position: Sitting, Cuff Size: Large)   Pulse 73   Temp 100 F (37.8 C) (Tympanic)   Resp 16   Ht 5\' 2"  (1.575 m)   Wt 273 lb (123.8 kg)   LMP 04/26/2002 (LMP Unknown)   SpO2 96%   BMI 49.93 kg/m    Physical Exam  General appearance: Severely obese female, cooperative and in no acute distress Head: Normocephalic, without obvious abnormality, atraumatic Respiratory: Respirations even and unlabored, normal respiratory rate Extremities: All extremities are intact.  Skin: Skin color, texture, turgor normal. No rashes seen  Psych: Appropriate mood and affect. Neurologic: Mental status: Alert, oriented to person, place, and time, thought content appropriate.   Results for orders placed or performed in visit on 11/04/19  POCT urinalysis dipstick  Result Value Ref Range   Color, UA  yellow    Clarity, UA cloudy    Glucose, UA Negative Negative   Bilirubin, UA neg    Ketones, UA neg    Spec Grav, UA >=1.030 (A) 1.010 - 1.025   Blood, UA neg    pH, UA 7.0 5.0 - 8.0   Protein, UA Negative Negative   Urobilinogen, UA 0.2 0.2 or 1.0 E.U./dL   Nitrite, UA neg    Leukocytes, UA Negative Negative   Appearance cloudy    Odor none    Depression screen [facility] 2/9 11/04/2019 11/04/2019 06/29/2018  Decreased Interest 1 1 0  Down, Depressed, Hopeless 1 1 0  PHQ - 2 Score 2 2 0  Altered sleeping 0 0 0  Tired, decreased energy 3 3 2   Change in appetite 0 0 0  Feeling bad or failure about yourself  0 0 3  Trouble concentrating 0 1  0  Moving slowly or fidgety/restless 1 0 0  Suicidal thoughts 0 1 0  PHQ-9 Score 6 7 5   Difficult doing work/chores Somewhat difficult - Extremely dIfficult      Assessment & Plan     1. Acute cystitis without hematuria Await culture results before deciding on antibiotic treatment.  - CULTURE, URINE COMPREHENSIVE  2. Encounter for special screening examination for cardiovascular disorder Due for - Lipid panel  3. Seizure disorder ([place]) Very well controlled.  - CBC - Comprehensive metabolic panel - Dilantin (Phenytoin) level, total  4. Morbid obesity ([place]) check- TSH  5. Partial idiopathic epilepsy with seizures of localized onset, not intractable, without status epilepticus ([place])  - TSH  6. Abnormal weight gain   - TSH        The entirety of the information documented in the History of Present Illness, Review of Systems and Physical Exam were personally obtained by me. Portions of this information were initially documented by the CMA and reviewed by me for thoroughness and accuracy.      Lelon Huh, MD  [facility] [phone] (phone) [phone] (fax)  [place]

## 2019-11-04 NOTE — Patient Instructions (Addendum)
.   Please review the attached list of medications and notify my office if there are any errors.   . Please call the [facility] [phone]) to schedule a routine screening mammogram.    The CDC recommends two doses of Shingrix (the shingles vaccine) separated by 2 to 6 months for adults age 52 years and older. I recommend checking with your pharmacy plan regarding coverage for this vaccine.   . Please go to the lab draw station in [address] on the second floor of [facility]  when you are fasting for 8 hours. Normal hours are 8:00am to 12:30pm and 1:30pm to 4:00pm Monday through Friday

## 2019-11-07 LAB — CULTURE, URINE COMPREHENSIVE

## 2019-11-08 ENCOUNTER — Telehealth: Payer: Self-pay

## 2019-11-08 ENCOUNTER — Telehealth: Payer: Self-pay | Admitting: Family Medicine

## 2019-11-08 NOTE — Telephone Encounter (Signed)
-----   Message from Birdie Sons, MD sent at 11/07/2019  5:52 PM EDT ----- Urine culture growing a mix of normal bacteria. If sx have gotten better then no treatment needed. If she is still having symptoms we can get her a course of an antibiotic.

## 2019-11-08 NOTE — Telephone Encounter (Signed)
Copied from [place] [id number]. Topic: General - Other >> Nov 08, 2019 12:22 PM Celene Kras wrote: Reason for CRM: Pts husband called stating that the pt uti results from mychart. He states that the pt is still needing to have an antibiotic sent in. Please advise.     [id number] [address], [place] AT [facility] [address]  [name] [address]  Phone: [phone] Fax: [phone]  Hours: Not open 24 hours

## 2019-11-08 NOTE — Telephone Encounter (Signed)
Pt given results per Dr Lelon Huh, [facility], "Urine culture growing a mix of normal bacteria. If sx have gotten better then no treatment needed. If she is still having symptoms we can get her a course of an antibiotic"; the pt says she is still having symptoms" back pain, burning with urination, nausea; she uses [facility] in [place]; will route to office for final disposition.

## 2019-11-08 NOTE — Telephone Encounter (Signed)
Attempted to contact patient, no answer left a voicemail. Okay for PEC to advise patient.  

## 2019-11-09 ENCOUNTER — Other Ambulatory Visit: Payer: Self-pay | Admitting: Family Medicine

## 2019-11-09 DIAGNOSIS — F32A Depression, unspecified: Secondary | ICD-10-CM

## 2019-11-09 DIAGNOSIS — F329 Major depressive disorder, single episode, unspecified: Secondary | ICD-10-CM

## 2019-11-09 MED ORDER — CEPHALEXIN 500 MG PO CAPS: 500.0000 mg | ORAL_CAPSULE | Freq: Two times a day (BID) | ORAL | 0 refills | Status: AC

## 2019-11-09 NOTE — Telephone Encounter (Signed)
Requested Prescriptions  Pending Prescriptions Disp Refills  . buPROPion (WELLBUTRIN XL) 150 MG 24 hr tablet [Pharmacy Med Name: BUPROPION XL 150MG  TABLETS (24 H)] 90 tablet 1    Sig: TAKE 1 TABLET(150 MG) BY MOUTH EVERY MORNING     Psychiatry: Antidepressants - bupropion Passed - 11/09/2019  7:22 PM      Passed - Completed PHQ-2 or PHQ-9 in the last 360 days.      Passed - Last BP in normal range    BP Readings from Last 1 Encounters:  11/04/19 126/79         Passed - Valid encounter within last 6 months    Recent Outpatient Visits          5 days ago Acute cystitis without hematuria   [facility] Birdie Sons, MD   5 months ago Change in facial mole   [place], [place], [place]   8 months ago Seizure disorder [facility])   [facility] Birdie Sons, MD   1 year ago Acute non-recurrent frontal sinusitis   [facility] Birdie Sons, MD   1 year ago Depressive disorder   [place], Kirstie Peri, MD

## 2019-11-09 NOTE — Addendum Note (Signed)
Addended by: Birdie Sons on: 11/09/2019 10:19 AM   Modules accepted: Orders

## 2019-11-11 NOTE — Telephone Encounter (Signed)
RX has been sent to pharmacy. Patient's husband  advised.

## 2019-11-11 NOTE — Telephone Encounter (Signed)
Copied from [place] [id number]. Topic: General - Inquiry >> Nov 08, 2019 12:18 PM Gillis Ends D wrote: Reason for CRM: Patient's husband called and stated that on yesterday a nurse called his wife and told her that her UTI was positive and that someone would call her back in reference to receiving an antibiotic but she didn't receive a call and was following up. She is requesting a call back, she can be reached at [phone].Please advise

## 2019-11-13 ENCOUNTER — Other Ambulatory Visit: Payer: Self-pay | Admitting: Family Medicine

## 2019-11-13 DIAGNOSIS — R232 Flushing: Secondary | ICD-10-CM

## 2019-11-13 NOTE — Telephone Encounter (Signed)
Requested medications are due for refill today?  Yes  Requested medications are on active medication list?  Yes  Last Refill:  11/06/2018 # 90 with 3 refills   Future visit scheduled?  No   Notes to Clinic:  Medication failed Rx refill protocol due to no mammogram within established time frame.

## 2019-11-29 ENCOUNTER — Other Ambulatory Visit: Payer: Self-pay | Admitting: Family Medicine

## 2019-11-29 NOTE — Telephone Encounter (Signed)
Requested medication (s) are due for refill today: na  Requested medication (s) are on the active medication list: yes   Last refill:  11/13/2018 #60 11 refills   Future visit scheduled: no  Notes to clinic:  medication expired. Do you want to renew Rx?     Requested Prescriptions  Pending Prescriptions Disp Refills   amitriptyline (ELAVIL) 25 MG tablet [Pharmacy Med Name: AMITRIPTYLINE 25MG  TABLETS] 60 tablet 11    Sig: TAKE 1 TO 2 TABLETS(25 TO 50 MG) BY MOUTH AT BEDTIME      Psychiatry:  Antidepressants - Heterocyclics (TCAs) Passed - 11/29/2019  5:46 PM      Passed - Completed PHQ-2 or PHQ-9 in the last 360 days      Passed - Valid encounter within last 6 months    Recent Outpatient Visits           3 weeks ago Acute cystitis without hematuria   [facility] Birdie Sons, MD   5 months ago Change in facial mole   [place], [place], [place]   8 months ago Seizure disorder [facility])   [facility] Birdie Sons, MD   1 year ago Acute non-recurrent frontal sinusitis   [facility] Birdie Sons, MD   1 year ago Depressive disorder   [place], Kirstie Peri, MD

## 2019-12-02 ENCOUNTER — Other Ambulatory Visit: Payer: Self-pay

## 2019-12-02 NOTE — Telephone Encounter (Signed)
Copied from [place] [id number]. Topic: General - Other >> Dec 02, 2019  3:29 PM Yvette Rack wrote: Reason for CRM: Pt spouse called for an update on the refill request for amitriptyline (ELAVIL) 25 MG tablet. Pt spouse stated pt is not able to sleep without the medication

## 2019-12-04 ENCOUNTER — Other Ambulatory Visit: Payer: Self-pay | Admitting: Family Medicine

## 2019-12-24 ENCOUNTER — Other Ambulatory Visit: Payer: Self-pay | Admitting: Family Medicine

## 2019-12-28 ENCOUNTER — Other Ambulatory Visit: Payer: Self-pay | Admitting: Family Medicine

## 2019-12-28 DIAGNOSIS — G40909 Epilepsy, unspecified, not intractable, without status epilepticus: Secondary | ICD-10-CM

## 2019-12-28 NOTE — Telephone Encounter (Signed)
Requested medication (s) are due for refill today: yes  Requested medication (s) are on the active medication list: yes  Last refill:  03/06/19  Future visit scheduled: no  Notes to clinic:  med not delegated to NT to RF   Requested Prescriptions  Pending Prescriptions Disp Refills   phenytoin (DILANTIN) 100 MG ER capsule [Pharmacy Med Name: PHENYTOIN SODIUM 100MG  EXT CAPSULES] 360 capsule 1    Sig: TAKE 2 CAPSULES(200 MG) BY MOUTH TWICE DAILY      Not Delegated - Neurology:  Anticonvulsants - phenytoin Failed - 12/28/2019 10:56 AM      Failed - This refill cannot be delegated      Failed - ALT in normal range and within 360 days    ALT  Date Value Ref Range Status  02/04/2016 19 0 - 32 IU/L Final          Failed - AST in normal range and within 360 days    AST  Date Value Ref Range Status  02/04/2016 19 0 - 40 IU/L Final          Failed - HGB in normal range and within 360 days    Hemoglobin  Date Value Ref Range Status  02/04/2016 11.8 11.1 - 15.9 g/dL Final          Failed - HCT in normal range and within 360 days    Hematocrit  Date Value Ref Range Status  02/04/2016 35.6 34.0 - 46.6 % Final          Failed - PLT in normal range and within 360 days    Platelets  Date Value Ref Range Status  02/04/2016 301 150 - 379 x10E3/uL Final          Failed - WBC in normal range and within 360 days    WBC  Date Value Ref Range Status  02/04/2016 7.8 3.4 - 10.8 x10E3/uL Final  09/18/2012 8.9 3.6 - 11.0 x10 3/mm 3 Final          Failed - Phenytoin (serum) in normal range and within 360 days    Phenytoin (Dilantin), Serum  Date Value Ref Range Status  03/08/2016 2.1 (L) 10.0 - 20.0 ug/mL Final    Comment:                                    Detection Limit =  0.8                           <0.8 Indicates None Detected   03/17/2015 2.4 (L) 10.0 - 20.0 ug/mL Final    Comment:                                    Neonatal:                                  Therapeutic 6.0 - 14.0                                 Detection Limit =  0.8                           <  0.8 Indicates None Detected    Phenytoin, Free  Date Value Ref Range Status  08/07/2014 None Detected 1.0 - 2.0 ug/mL Final    Comment:                                    Detection Limit = 0.5          Passed - Valid encounter within last 12 months    Recent Outpatient Visits           1 month ago Acute cystitis without hematuria   [facility] Birdie Sons, MD   6 months ago Change in facial mole   [place], [place], [place]   9 months ago Seizure disorder [facility])   [facility] Birdie Sons, MD   1 year ago Acute non-recurrent frontal sinusitis   [facility] Birdie Sons, MD   1 year ago Depressive disorder   [place], Kirstie Peri, MD

## 2019-12-28 NOTE — Telephone Encounter (Signed)
Requested medication (s) are due for refill today: yes  Requested medication (s) are on the active medication list: yes  Last refill:  09/29/19  Future visit scheduled: no  Notes to clinic:  med not delegated to NT to RF   Requested Prescriptions  Pending Prescriptions Disp Refills   LYRICA 225 MG capsule [Pharmacy Med Name: LYRICA 225MG ** CAPSULES] 180 capsule     Sig: TAKE 1 CAPSULE(225 MG) BY MOUTH TWICE DAILY      Not Delegated - Neurology:  Anticonvulsants - Controlled Failed - 12/28/2019  9:42 AM      Failed - This refill cannot be delegated      Passed - Valid encounter within last 12 months    Recent Outpatient Visits           1 month ago Acute cystitis without hematuria   [facility] Birdie Sons, MD   6 months ago Change in facial mole   [facility] Carles Collet M, [place]   9 months ago Seizure disorder [facility])   [facility] Birdie Sons, MD   1 year ago Acute non-recurrent frontal sinusitis   [facility] Birdie Sons, MD   1 year ago Depressive disorder   [place], Kirstie Peri, MD

## 2019-12-30 NOTE — Telephone Encounter (Signed)
Please advise patient that we've sent 30 day refill to her pharmacy, but she is overdue for labs that were ordered in October and needs to have done this month

## 2020-01-01 ENCOUNTER — Other Ambulatory Visit: Payer: Self-pay | Admitting: Family Medicine

## 2020-01-01 DIAGNOSIS — G40909 Epilepsy, unspecified, not intractable, without status epilepticus: Secondary | ICD-10-CM

## 2020-01-01 NOTE — Telephone Encounter (Signed)
Please review... Pt has not had her dilantin levels checked since 02/2016   Thanks,   -Mickel Baas

## 2020-01-01 NOTE — Telephone Encounter (Signed)
Notes to clinic:  Patient requests 90 days supply   Requested Prescriptions  Pending Prescriptions Disp Refills   phenytoin (DILANTIN) 100 MG ER capsule [Pharmacy Med Name: PHENYTOIN SODIUM 100MG  EXT CAPSULES] 360 capsule     Sig: TAKE 2 CAPSULES(200 MG) BY MOUTH TWICE DAILY      Not Delegated - Neurology:  Anticonvulsants - phenytoin Failed - 01/01/2020  7:17 AM      Failed - This refill cannot be delegated      Failed - ALT in normal range and within 360 days    ALT  Date Value Ref Range Status  02/04/2016 19 0 - 32 IU/L Final          Failed - AST in normal range and within 360 days    AST  Date Value Ref Range Status  02/04/2016 19 0 - 40 IU/L Final          Failed - HGB in normal range and within 360 days    Hemoglobin  Date Value Ref Range Status  02/04/2016 11.8 11.1 - 15.9 g/dL Final          Failed - HCT in normal range and within 360 days    Hematocrit  Date Value Ref Range Status  02/04/2016 35.6 34.0 - 46.6 % Final          Failed - PLT in normal range and within 360 days    Platelets  Date Value Ref Range Status  02/04/2016 301 150 - 379 x10E3/uL Final          Failed - WBC in normal range and within 360 days    WBC  Date Value Ref Range Status  02/04/2016 7.8 3.4 - 10.8 x10E3/uL Final  09/18/2012 8.9 3.6 - 11.0 x10 3/mm 3 Final          Failed - Phenytoin (serum) in normal range and within 360 days    Phenytoin (Dilantin), Serum  Date Value Ref Range Status  03/08/2016 2.1 (L) 10.0 - 20.0 ug/mL Final    Comment:                                    Detection Limit =  0.8                           <0.8 Indicates None Detected   03/17/2015 2.4 (L) 10.0 - 20.0 ug/mL Final    Comment:                                    Neonatal:                                 Therapeutic 6.0 - 14.0                                 Detection Limit =  0.8                           <0.8 Indicates None Detected    Phenytoin, Free  Date Value Ref Range  Status  08/07/2014 None Detected 1.0 - 2.0 ug/mL Final  Comment:                                    Detection Limit = 0.5          Passed - Valid encounter within last 12 months    Recent Outpatient Visits           1 month ago Acute cystitis without hematuria   [facility] Birdie Sons, MD   6 months ago Change in facial mole   [place], [place], [place]   10 months ago Seizure disorder [facility])   [facility] Birdie Sons, MD   1 year ago Acute non-recurrent frontal sinusitis   [facility] Birdie Sons, MD   1 year ago Depressive disorder   [place], Kirstie Peri, MD

## 2020-01-01 NOTE — Telephone Encounter (Signed)
Tried calling patient. Left message to call back. OK for [facility] triage to advise of message.

## 2020-01-02 NOTE — Telephone Encounter (Signed)
Patient advised as below. She agrees to have labs done this month. She says she didn't get her labs done in October because after her last visit she became sick with flu like symptoms. She says she didn't go anywhere for several weeks after being sick. Patient apologizes for not having lab work done sooner.

## 2020-01-12 ENCOUNTER — Other Ambulatory Visit: Payer: Self-pay | Admitting: Family Medicine

## 2020-01-12 NOTE — Telephone Encounter (Signed)
Called pt to discuss prescription request for ear drops. Pt stated that ears began hurting this am. No drainage or fever. Advised that pt usually has to come into office to be evaluated. Pt stated that Dr Caryn Section will order it without appt. Pt stated ear discomfort happens annually.  Med last ordered 11/02/18 and expired 11/09/18. Routing to [facility].

## 2020-01-22 ENCOUNTER — Other Ambulatory Visit: Payer: Self-pay | Admitting: Family Medicine

## 2020-02-02 ENCOUNTER — Other Ambulatory Visit: Payer: Self-pay | Admitting: Family Medicine

## 2020-02-02 NOTE — Telephone Encounter (Signed)
Requested medication (s) are due for refill today: yes  Requested medication (s) are on the active medication list: yes  Last refill:  02/12/18  Future visit scheduled: no  Notes to clinic:  needs appt- message sent via MyChart: Just wanted to remind you that you are due for an office visit for medication refill. The medication is Paxil. Please call the office to schedule. Thank you!    Requested Prescriptions  Pending Prescriptions Disp Refills   PARoxetine (PAXIL) 40 MG tablet [Pharmacy Med Name: PAROXETINE 40MG  TABLETS] 90 tablet 4    Sig: TAKE 1 TABLET BY MOUTH EVERY DAY      Psychiatry:  Antidepressants - SSRI Passed - 02/02/2020  7:05 AM      Passed - Completed PHQ-2 or PHQ-9 in the last 360 days      Passed - Valid encounter within last 6 months    Recent Outpatient Visits           3 months ago Acute cystitis without hematuria   [facility] Birdie Sons, MD   7 months ago Change in facial mole   [place], [place]   11 months ago Seizure disorder [facility])   [facility] Birdie Sons, MD   1 year ago Acute non-recurrent frontal sinusitis   [facility] Birdie Sons, MD   1 year ago Depressive disorder   [place], Kirstie Peri, MD

## 2020-02-08 ENCOUNTER — Other Ambulatory Visit: Payer: Self-pay | Admitting: Family Medicine

## 2020-02-08 NOTE — Telephone Encounter (Signed)
  Notes to clinic Unsure if to be continued.

## 2020-02-13 ENCOUNTER — Telehealth: Payer: Self-pay | Admitting: Family Medicine

## 2020-02-13 NOTE — Telephone Encounter (Signed)
Copied from [place] [id number]. Topic: Medicare AWV >> Feb 13, 2020  1:40 PM Cher Nakai R wrote: Reason for CRM:   Left message for patient to call back and schedule Medicare Annual Wellness Visit (AWV) in office.   If not able to come in the office, please offer to do virtually or by telephone.   No hx of AWV -  Please schedule at anytime with [facility] Health Advisor.   40 minute appointment  Any questions, please contact me at [phone]

## 2020-02-25 DIAGNOSIS — G40009 Localization-related (focal) (partial) idiopathic epilepsy and epileptic syndromes with seizures of localized onset, not intractable, without status epilepticus: Secondary | ICD-10-CM | POA: Diagnosis not present

## 2020-02-25 DIAGNOSIS — Z136 Encounter for screening for cardiovascular disorders: Secondary | ICD-10-CM | POA: Diagnosis not present

## 2020-02-25 DIAGNOSIS — Z23 Encounter for immunization: Secondary | ICD-10-CM | POA: Diagnosis not present

## 2020-02-25 DIAGNOSIS — G40909 Epilepsy, unspecified, not intractable, without status epilepticus: Secondary | ICD-10-CM | POA: Diagnosis not present

## 2020-02-25 DIAGNOSIS — R635 Abnormal weight gain: Secondary | ICD-10-CM | POA: Diagnosis not present

## 2020-02-26 LAB — LIPID PANEL
Chol/HDL Ratio: 2.7 ratio (ref 0.0–4.4)
Cholesterol, Total: 245 mg/dL — ABNORMAL HIGH (ref 100–199)
HDL: 92 mg/dL (ref >39)
LDL Chol Calc (NIH): 107 mg/dL — ABNORMAL HIGH (ref 0–99)
Triglycerides: 278 mg/dL — ABNORMAL HIGH (ref 0–149)
VLDL Cholesterol Cal: 46 mg/dL — ABNORMAL HIGH (ref 5–40)

## 2020-02-26 LAB — CBC
Hematocrit: 36.4 % (ref 34.0–46.6)
Hemoglobin: 12.7 g/dL (ref 11.1–15.9)
MCH: 31.5 pg (ref 26.6–33.0)
MCHC: 34.9 g/dL (ref 31.5–35.7)
MCV: 90 fL (ref 79–97)
Platelets: 326 10*3/uL (ref 150–450)
RBC: 4.03 x10E6/uL (ref 3.77–5.28)
RDW: 11.7 % (ref 11.7–15.4)
WBC: 7.2 10*3/uL (ref 3.4–10.8)

## 2020-02-26 LAB — COMPREHENSIVE METABOLIC PANEL
ALT: 26 IU/L (ref 0–32)
AST: 21 IU/L (ref 0–40)
Albumin/Globulin Ratio: 1.3 (ref 1.2–2.2)
Albumin: 4.1 g/dL (ref 3.8–4.9)
Alkaline Phosphatase: 90 IU/L (ref 44–121)
BUN/Creatinine Ratio: 15 (ref 9–23)
BUN: 10 mg/dL (ref 6–24)
Bilirubin Total: <0.2 mg/dL (ref 0.0–1.2)
CO2: 25 mmol/L (ref 20–29)
Calcium: 9.1 mg/dL (ref 8.7–10.2)
Chloride: 101 mmol/L (ref 96–106)
Creatinine, Ser: 0.67 mg/dL (ref 0.57–1.00)
GFR calc Af Amer: 117 mL/min/{1.73_m2} (ref >59)
GFR calc non Af Amer: 101 mL/min/{1.73_m2} (ref >59)
Globulin, Total: 3.2 g/dL (ref 1.5–4.5)
Glucose: 89 mg/dL (ref 65–99)
Potassium: 4.5 mmol/L (ref 3.5–5.2)
Sodium: 138 mmol/L (ref 134–144)
Total Protein: 7.3 g/dL (ref 6.0–8.5)

## 2020-02-26 LAB — PHENYTOIN LEVEL, TOTAL: Phenytoin (Dilantin), Serum: 7.4 ug/mL — ABNORMAL LOW (ref 10.0–20.0)

## 2020-02-26 LAB — TSH: TSH: 6.91 u[IU]/mL — ABNORMAL HIGH (ref 0.450–4.500)

## 2020-02-27 ENCOUNTER — Ambulatory Visit: Payer: Self-pay | Admitting: *Deleted

## 2020-02-27 NOTE — Telephone Encounter (Signed)
C/o insomnia x 8-9 days with increased anxiety. Reports only sleeping 15 minutes at a time of sleep at night before she awakens with nightmares. Nightmares reported for a couple of weeks . No new meds taken or any food eaten prior to bedtime. Increased anxiety reported and requires her to take xanax prior to riding in a car. Denies pain, difficulty breathing at rest. No new domestic issues or stresses reported. Hx of brain surgery. Denies headaches, dizziness. appt scheduled for 03/03/20. Care advise given. Patient verbalized understanding of care advise and to call back if symptoms worsen.   Reason for Disposition . Insomnia is an ongoing problem (> 2 weeks)  Answer Assessment - Initial Assessment Questions 1. DESCRIPTION: "Tell me about your sleeping problem."      Cant sleep more than 15 minutes at a time. Has been 8-9 days since a full night of sleep  2. ONSET: "How long have you been having trouble sleeping?" (e.g., days, weeks, months)     8-9 days  3. RECURRENT: "Have you had sleeping problems before?"  If Yes, ask: "What happened that time?" "What helped your sleeping problem go away in the past?"      Stressed in her 60's and started on amitriptyline due to domestic issues , hx brain surgery  4. STRESS: "Is there anything in your life that is making you feel stressed or tense?"     No  5. PAIN: "Do you have any pain that is keeping you awake?" (e.g., back pain, headache, abdominal pain)     No . Does have fibromyalgia  6. CAFFEINE ABUSE: "Do you drink caffeinated beverages, and how much each day?" (e.g., coffee, tea, colas)     None  7. ALCOHOL USE OR SUBSTANCE USE (DRUG USE): "Do you drink alcohol or use any illegal drugs?"     No  8. OTHER SYMPTOMS: "Do you have any other symptoms?"  (e.g., difficulty breathing)     No  Protocols used: INSOMNIA-A-AH

## 2020-03-02 NOTE — Progress Notes (Signed)
MyChart Video Visit    Virtual Visit via Video Note   This visit type was conducted due to national recommendations for restrictions regarding the COVID-19 Pandemic (e.g. social distancing) in an effort to limit this patient's exposure and mitigate transmission in our community. This patient is at least at moderate risk for complications without adequate follow up. This format is felt to be most appropriate for this patient at this time. Physical exam was limited by quality of the video and audio technology used for the visit.   Patient location: home Provider location: [facility]  I discussed the limitations of evaluation and management by telemedicine and the availability of in person appointments. The patient expressed understanding and agreed to proceed.  Patient: Sharon Bryan   DOB: 1965-03-18   55 y.o. Female  MRN: [id number] Visit Date: 03/03/2020  Today's healthcare provider: Lelon Huh, MD   Chief Complaint  Patient presents with  . Anxiety  . Insomnia   Subjective    HPI  Anxiety, Follow-up  Patient complains or worsening anxiety. She has been taking Xanax and Paxil to help with symptoms with little relief.    She reports good compliance with treatment. She reports good tolerance of treatment. She is not having side effects.   She feels her anxiety is moderate and Worse since last visit.  Symptoms: No chest pain Yes difficulty concentrating  Yes dizziness Yes fatigue  No feelings of losing control Yes insomnia  Yes irritable No palpitations  No panic attacks Yes racing thoughts  No shortness of breath Yes sweating  No tremors/shakes    GAD-7 Results GAD-7 Generalized Anxiety Disorder Screening Tool 03/03/2020  1. Feeling Nervous, Anxious, or on Edge 3  2. Not Being Able to Stop or Control Worrying 1  3. Worrying Too Much About Different Things 1  4. Trouble Relaxing 1  5. Being So Restless it's Hard To Sit Still 0  6. Becoming Easily Annoyed or  Irritable 3  7. Feeling Afraid As If Something Awful Might Happen 1  Total GAD-7 Score 10  Difficulty At Work, Home, or Getting  Along With Others? Somewhat difficult    PHQ-9 Scores PHQ9 SCORE ONLY 03/03/2020 11/04/2019 11/04/2019  PHQ-9 Total Score 15 6 7     She has seen counselor, Aline August several years ago who she found to be helpful.  She has not had any specific triggers for anxiety or depression. Her husband states he feels like her mood started getting worse after dilantin dose was doubled about a year ago and has slowly, progressively gotten worse since then. Prior to the increase of dilantin dose she was having episodes of tongue biting during sleep which she felt was related to seizures. That has stopped. Recent dilantin levels were slightly subtherapeutic.  ---------------------------------------------------------------------------------------------------   Medications: Outpatient Medications Prior to Visit  Medication Sig  . ALPRAZolam (XANAX) 1 MG tablet TAKE 1/2 TO 1 TABLET BY MOUTH EVERY 8 HOURS AS NEEDED  . amitriptyline (ELAVIL) 25 MG tablet Take 1-2 tablets (25-50 mg total) by mouth at bedtime.  . Aspirin-Acetaminophen-Caffeine (EXCEDRIN PO) Take 1 tablet daily as needed by mouth.  Marland Kitchen buPROPion (WELLBUTRIN XL) 150 MG 24 hr tablet TAKE 1 TABLET(150 MG) BY MOUTH EVERY MORNING  . busPIRone (BUSPAR) 30 MG tablet TAKE 1 TABLET BY MOUTH TWICE DAILY  . estradiol (ESTRACE) 2 MG tablet TAKE 1 TABLET(2 MG) BY MOUTH DAILY  . lamoTRIgine (LAMICTAL) 100 MG tablet TAKE 1 TABLET BY MOUTH TWICE DAILY  .  LYRICA 225 MG capsule TAKE 1 CAPSULE(225 MG) BY MOUTH TWICE DAILY  . Magnesium Oxide 420 MG TABS Take 400 mg by mouth daily.  . meloxicam (MOBIC) 15 MG tablet TAKE 1 TABLET BY MOUTH EVERY DAY AS NEEDED FOR PAIN  . Multiple Vitamins-Minerals (CENTRUM SILVER PO) Take by mouth.  . neomycin-polymyxin-hydrocortisone (CORTISPORIN) 3.5-10000-1 OTIC suspension SHAKE LIQUID AND INSTILL 3 TO  4 DROPS TO AFFECTED EAR FOUR TIMES DAILY  . omeprazole (PRILOSEC) 20 MG capsule TAKE 1 CAPSULE BY MOUTH EVERY DAY  . ondansetron (ZOFRAN) 4 MG tablet Take 1 tablet (4 mg total) every 8 (eight) hours as needed by mouth for nausea or vomiting.  Marland Kitchen PARoxetine (PAXIL) 40 MG tablet TAKE 1 TABLET BY MOUTH EVERY DAY  . phenytoin (DILANTIN) 100 MG ER capsule TAKE 2 CAPSULES(200 MG) BY MOUTH TWICE DAILY  . fluticasone (FLONASE) 50 MCG/ACT nasal spray Place 2 sprays into both nostrils daily.   No facility-administered medications prior to visit.    Review of Systems  Constitutional: Negative for appetite change, chills, fatigue and fever.  Respiratory: Negative for chest tightness and shortness of breath.   Cardiovascular: Negative for chest pain and palpitations.  Gastrointestinal: Negative for abdominal pain, nausea and vomiting.  Neurological: Negative for dizziness and weakness.  Psychiatric/Behavioral: Positive for agitation. The patient is nervous/anxious.      Objective    LMP 04/26/2002 (LMP Unknown)   Physical Exam   Awake, alert, oriented x 3. In no apparent distress   Assessment & Plan     1. Generalized anxiety disorder Worsening over the last few months, but may have started when dilantin dose was doubled last year which may have interacted with other mood altering medications. She has also been perseverating on traumatic childhood events and was encouraged to get reconnect with her therapist Aline August.   For now will increase paroxetine by 20mg  and reassess in a month.  - PARoxetine (PAXIL) 20 MG tablet; Take 1 tablet (20 mg total) by mouth daily. Take in addition to 40mg  tablets for one month  Dispense: 30 tablet; Refill: 0  2. Insomnia, unspecified type Can increase amitriptyline to 3 x 25mg  tablet hs as needed for the time being.  - amitriptyline (ELAVIL) 25 MG tablet; Take 2-3 tablets (50-75 mg total) by mouth at bedtime.  3. Depressive disorder Increase paroxetine  as above.   4. Seizure disorder ([place]) Was doubled from 200 to 400mg  about a year ago due to suspect nighttime events which have resolved. Is still slightly subtherapeutic but increased dose may have had negative affect on her mood, will reduce to  - phenytoin (DILANTIN) 100 MG ER capsule; Take 3 capsules (300 mg total) by mouth daily.   She was also noted to have slightly elevated TSH and recent labs. Will have her follow up in a 3-4 weeks and recheck labs at that time.        I discussed the assessment and treatment plan with the patient. The patient was provided an opportunity to ask questions and all were answered. The patient agreed with the plan and demonstrated an understanding of the instructions.   The patient was advised to call back or seek an in-person evaluation if the symptoms worsen or if the condition fails to improve as anticipated.  I provided 12 minutes of non-face-to-face time during this encounter.  The entirety of the information documented in the History of Present Illness, Review of Systems and Physical Exam were personally obtained by me. Portions of  this information were initially documented by the CMA and reviewed by me for thoroughness and accuracy.     Lelon Huh, MD [facility] [phone] (phone) [phone] (fax)  [place]

## 2020-03-03 ENCOUNTER — Telehealth (INDEPENDENT_AMBULATORY_CARE_PROVIDER_SITE_OTHER): Payer: Medicare Other | Admitting: Family Medicine

## 2020-03-03 DIAGNOSIS — F411 Generalized anxiety disorder: Secondary | ICD-10-CM

## 2020-03-03 DIAGNOSIS — F329 Major depressive disorder, single episode, unspecified: Secondary | ICD-10-CM | POA: Diagnosis not present

## 2020-03-03 DIAGNOSIS — G40909 Epilepsy, unspecified, not intractable, without status epilepticus: Secondary | ICD-10-CM | POA: Diagnosis not present

## 2020-03-03 DIAGNOSIS — G4700 Insomnia, unspecified: Secondary | ICD-10-CM

## 2020-03-03 MED ORDER — PAROXETINE HCL 20 MG PO TABS: 20.0000 mg | ORAL_TABLET | Freq: Every day | ORAL | 0 refills | Status: DC

## 2020-03-03 MED ORDER — AMITRIPTYLINE HCL 25 MG PO TABS: 50.0000 mg | ORAL_TABLET | Freq: Every day | ORAL | Status: DC

## 2020-03-03 MED ORDER — PHENYTOIN SODIUM EXTENDED 100 MG PO CAPS: 300.0000 mg | ORAL_CAPSULE | Freq: Every day | ORAL | Status: DC

## 2020-03-03 NOTE — Patient Instructions (Addendum)
.   Add additional 20mg  paroxetine to make 60mg  every day  . Add additional amitriptyline to make 3 x 25mg  tablet every night to help sleep.   . reduce phenytoin (Dilantin) to 3 tablets daily  . Start exercising every day. Start with a 10 minute walk every day and work up to 30 minutes a day. It doesn't have to be 30 minutes at once. Three short 10 minutes walks would be just as helpful.

## 2020-03-10 ENCOUNTER — Ambulatory Visit: Payer: Self-pay | Admitting: Family Medicine

## 2020-03-10 DIAGNOSIS — G40909 Epilepsy, unspecified, not intractable, without status epilepticus: Secondary | ICD-10-CM

## 2020-03-10 NOTE — Telephone Encounter (Signed)
She needs referral to neurology to see if there is another seizure medication that may work better for her. In the meantime she should go back up to previous dose of dilantin.

## 2020-03-10 NOTE — Telephone Encounter (Signed)
Pt reports seizure activity last night. States she did not wake up during activity but noted this AM she had bit her tongue "And this has happened in the past." States husband was with her, also did not wake up, not witnessed. States she has felt "Brain fog and fatigued today. States tongue is "OK, not a bad bite, did not go through tongue." Reports medication dosage was "Decreased last week from 2 tabs in AM and night  to 1 tab AM and night."  Note from 03/03/20 reads Dilantin decreased to 3 tabs daily.   CAn not recall last seizure,"Maybe a year or 2 ago." NT called practice '[facility]' to alert them NT would be sending HP triage. Pt's husband is with her presently. Advised ED if seizure activity occurs. Pt verbalizes understanding.   CB# [phone]  Reason for Disposition . Stopped taking seizure medicine (anticonvulsant)    Dosage decreased  Answer Assessment - Initial Assessment Questions 1. ONSET: "How long did the seizure last?" (Minutes)     Unsure, during night. Did not wake up 2. CONTENT: "Describe what happened during the seizure. Did the body become stiff? Was there any jerking?"      Noted this am bite through tongue 3. CIRCUMSTANCE: "What was the individual doing when the seizure began?"      Asleep. 4. MENTAL STATUS: "Does he know who he is, who you are, and where he is?"      "Brain fog" 5. PRIOR SEIZURES: "Has the individual had a seizure (convulsion) before?" If Yes, ask: "When was the last time?" and "What happened last time?"     Yes 6. EPILEPSY: "Does the individual have epilepsy?" (note: check for medical ID bracelet)      7. MEDICATIONS: "Does the individual take anticonvulsant medications?" (e.g., yes/no, compliance, any recent changes)     Dose decreased about a week ago 8. INJURY: "Did the individual hurt himself during the seizure?" (e.g., head, tongue)     *Bit tongue but "OK, not bad." 9. OTHER SYMPTOMS: "Are there any other symptoms?" (e.g., fever, headache)      Fatigued now  Protocols used: [facility]

## 2020-03-11 NOTE — Addendum Note (Signed)
Addended by: Randal Buba on: 03/11/2020 10:24 AM   Modules accepted: Orders

## 2020-03-11 NOTE — Telephone Encounter (Signed)
Patient advised and verbalized understanding. She agrees to referral. Please schedule appointment.

## 2020-03-24 ENCOUNTER — Other Ambulatory Visit: Payer: Self-pay | Admitting: Family Medicine

## 2020-03-24 DIAGNOSIS — G40909 Epilepsy, unspecified, not intractable, without status epilepticus: Secondary | ICD-10-CM

## 2020-03-24 NOTE — Telephone Encounter (Signed)
Requested medication (s) are due for refill today: yes  Requested medication (s) are on the active medication list:  yes  Last refill:  12/28/2019  Future visit scheduled: no  Notes to clinic: This refill cannot be delegate   Requested Prescriptions  Pending Prescriptions Disp Refills   phenytoin (DILANTIN) 100 MG ER capsule [Pharmacy Med Name: PHENYTOIN SODIUM 100MG  EXT CAPSULES] 180 capsule     Sig: TAKE 1 CAPSULE(100 MG) BY MOUTH TWICE DAILY      Not Delegated - Neurology:  Anticonvulsants - phenytoin Failed - 03/24/2020  3:21 AM      Failed - This refill cannot be delegated      Failed - Phenytoin (serum) in normal range and within 360 days    Phenytoin (Dilantin), Serum  Date Value Ref Range Status  02/25/2020 7.4 (L) 10.0 - 20.0 ug/mL Final    Comment:                                    Detection Limit =  0.8                           <0.8 Indicates None Detected   03/17/2015 2.4 (L) 10.0 - 20.0 ug/mL Final    Comment:                                    Neonatal:                                 Therapeutic 6.0 - 14.0                                 Detection Limit =  0.8                           <0.8 Indicates None Detected    Phenytoin, Free  Date Value Ref Range Status  08/07/2014 None Detected 1.0 - 2.0 ug/mL Final    Comment:                                    Detection Limit = 0.5          Passed - ALT in normal range and within 360 days    ALT  Date Value Ref Range Status  02/25/2020 26 0 - 32 IU/L Final          Passed - AST in normal range and within 360 days    AST  Date Value Ref Range Status  02/25/2020 21 0 - 40 IU/L Final          Passed - HGB in normal range and within 360 days    Hemoglobin  Date Value Ref Range Status  02/25/2020 12.7 11.1 - 15.9 g/dL Final          Passed - HCT in normal range and within 360 days    Hematocrit  Date Value Ref Range Status  02/25/2020 36.4 34.0 - 46.6 % Final          Passed - PLT in normal  range and within 360 days    Platelets  Date Value Ref Range Status  02/25/2020 326 150 - 450 x10E3/uL Final          Passed - WBC in normal range and within 360 days    WBC  Date Value Ref Range Status  02/25/2020 7.2 3.4 - 10.8 x10E3/uL Final  09/18/2012 8.9 3.6 - 11.0 x10 3/mm 3 Final          Passed - Valid encounter within last 12 months    Recent Outpatient Visits           3 weeks ago Insomnia, unspecified type   [facility] Birdie Sons, MD   4 months ago Acute cystitis without hematuria   [facility] Birdie Sons, MD   9 months ago Change in facial mole   [facility], Wendee Beavers, [place]   1 year ago Seizure disorder [facility])   [facility] Birdie Sons, MD   1 year ago Acute non-recurrent frontal sinusitis   [facility] Birdie Sons, MD

## 2020-03-25 ENCOUNTER — Encounter: Payer: Self-pay | Admitting: Family Medicine

## 2020-03-25 ENCOUNTER — Other Ambulatory Visit: Payer: Self-pay | Admitting: Family Medicine

## 2020-03-25 ENCOUNTER — Other Ambulatory Visit: Payer: Self-pay

## 2020-03-25 DIAGNOSIS — F329 Major depressive disorder, single episode, unspecified: Secondary | ICD-10-CM

## 2020-03-25 DIAGNOSIS — F411 Generalized anxiety disorder: Secondary | ICD-10-CM

## 2020-03-25 DIAGNOSIS — G40909 Epilepsy, unspecified, not intractable, without status epilepticus: Secondary | ICD-10-CM

## 2020-03-25 DIAGNOSIS — F32A Depression, unspecified: Secondary | ICD-10-CM

## 2020-03-27 ENCOUNTER — Telehealth: Payer: Self-pay

## 2020-03-27 DIAGNOSIS — G40909 Epilepsy, unspecified, not intractable, without status epilepticus: Secondary | ICD-10-CM

## 2020-03-27 MED ORDER — PHENYTOIN SODIUM EXTENDED 100 MG PO CAPS: 200.0000 mg | ORAL_CAPSULE | Freq: Two times a day (BID) | ORAL | 1 refills | Status: DC

## 2020-03-27 NOTE — Telephone Encounter (Signed)
Copied from [place] [id number]. Topic: General - Other >> Mar 27, 2020 10:40 AM Leward Quan A wrote: Reason for CRM: Patient husband called in to inform Dr Caryn Section  that since she started taking 4 phenytoin (DILANTIN) 100 MG ER capsule daily per Drs orders she need a new Rx to reflect the 4 tabs daily please say that patient will be out on 03/28/20. Please call with questions Ph# [phone]

## 2020-03-27 NOTE — Telephone Encounter (Signed)
Please review message and advise which dose of phenytoin patient is supposed to be on. Patients husband is requesting refill on this prescription that reflects the change of directions.

## 2020-03-30 ENCOUNTER — Other Ambulatory Visit: Payer: Self-pay

## 2020-03-30 DIAGNOSIS — F411 Generalized anxiety disorder: Secondary | ICD-10-CM

## 2020-03-30 DIAGNOSIS — F32A Depression, unspecified: Secondary | ICD-10-CM

## 2020-03-30 DIAGNOSIS — F329 Major depressive disorder, single episode, unspecified: Secondary | ICD-10-CM

## 2020-03-30 NOTE — Telephone Encounter (Signed)
Patient's husband would like to switch pharmacies. He says their previous pharmacy [facility]) has been giving them a hard time with getting prescriptions filled. Patient is requesting that we send these prescription to [facility] on [address] at [employer]. Please advise. Patient is aware that Dr. Caryn Section is out of the office today.

## 2020-03-31 MED ORDER — PAROXETINE HCL 30 MG PO TABS: 60.0000 mg | ORAL_TABLET | Freq: Every day | ORAL | 1 refills | Status: DC

## 2020-04-02 ENCOUNTER — Other Ambulatory Visit: Payer: Self-pay | Admitting: Family Medicine

## 2020-04-04 NOTE — Addendum Note (Signed)
Addended by: Birdie Sons on: 04/04/2020 07:49 AM   Modules accepted: Orders

## 2020-04-06 DIAGNOSIS — F411 Generalized anxiety disorder: Secondary | ICD-10-CM | POA: Diagnosis not present

## 2020-04-06 DIAGNOSIS — Z79899 Other long term (current) drug therapy: Secondary | ICD-10-CM | POA: Diagnosis not present

## 2020-04-06 DIAGNOSIS — F339 Major depressive disorder, recurrent, unspecified: Secondary | ICD-10-CM | POA: Diagnosis not present

## 2020-04-07 NOTE — Progress Notes (Unsigned)
This encounter was created in error - please disregard.

## 2020-04-08 ENCOUNTER — Other Ambulatory Visit: Payer: Self-pay

## 2020-04-11 ENCOUNTER — Encounter: Payer: Self-pay | Admitting: Family Medicine

## 2020-04-11 DIAGNOSIS — G4700 Insomnia, unspecified: Secondary | ICD-10-CM

## 2020-04-13 MED ORDER — AMITRIPTYLINE HCL 25 MG PO TABS: 50.0000 mg | ORAL_TABLET | Freq: Every day | ORAL | 3 refills | Status: DC

## 2020-04-21 ENCOUNTER — Other Ambulatory Visit: Payer: Self-pay | Admitting: Family Medicine

## 2020-04-21 ENCOUNTER — Encounter: Payer: Self-pay | Admitting: Family Medicine

## 2020-04-21 DIAGNOSIS — E039 Hypothyroidism, unspecified: Secondary | ICD-10-CM

## 2020-04-21 DIAGNOSIS — R7989 Other specified abnormal findings of blood chemistry: Secondary | ICD-10-CM

## 2020-05-01 ENCOUNTER — Encounter: Payer: Self-pay | Admitting: Family Medicine

## 2020-05-04 ENCOUNTER — Telehealth: Payer: Self-pay | Admitting: Family Medicine

## 2020-05-04 DIAGNOSIS — F411 Generalized anxiety disorder: Secondary | ICD-10-CM | POA: Diagnosis not present

## 2020-05-04 DIAGNOSIS — F339 Major depressive disorder, recurrent, unspecified: Secondary | ICD-10-CM | POA: Diagnosis not present

## 2020-05-04 NOTE — Telephone Encounter (Signed)
I called [employer] and completed PA over the phone. PA has been approved from 01/25/2020 until 01/23/2021. Patient notified.

## 2020-05-04 NOTE — Telephone Encounter (Signed)
Sharon Bryan is calling from [facility] is check on the status of the PA for pregabalin (LYRICA) 225 MG capsule [[id number]] Cb- [phone] Reference [id number]

## 2020-05-04 NOTE — Telephone Encounter (Signed)
Pts husband called regarding the PA for Lyrica. He states that the pt is completely out. Please advise.

## 2020-05-21 ENCOUNTER — Other Ambulatory Visit: Payer: Self-pay | Admitting: Family Medicine

## 2020-05-21 DIAGNOSIS — F32A Depression, unspecified: Secondary | ICD-10-CM

## 2020-05-21 DIAGNOSIS — F329 Major depressive disorder, single episode, unspecified: Secondary | ICD-10-CM

## 2020-05-21 NOTE — Telephone Encounter (Signed)
Requested Prescriptions  Pending Prescriptions Disp Refills  . buPROPion (WELLBUTRIN XL) 150 MG 24 hr tablet [Pharmacy Med Name: BUPROPION XL 150MG  TABLETS (24 H)] 90 tablet 0    Sig: TAKE 1 TABLET(150 MG) BY MOUTH EVERY MORNING     Psychiatry: Antidepressants - bupropion Failed - 05/21/2020  3:21 AM      Failed - Valid encounter within last 6 months    Recent Outpatient Visits          2 months ago Insomnia, unspecified type   [facility] Birdie Sons, MD   6 months ago Acute cystitis without hematuria   [facility] Birdie Sons, MD   11 months ago Change in facial mole   [facility] [place], Wendee Bea[place], [place]   1 year ago Seizure disorder [facility])   [facility] Birdie Sons, MD   1 year ago Acute non-recurrent frontal sinusitis   [facility] Birdie Sons, MD      Future Appointments            In 1 week Fisher, Kirstie Peri, MD [facility], [place] - Completed PHQ-2 or PHQ-9 in the last 360 days      Passed - Last BP in normal range    BP Readings from Last 1 Encounters:  11/04/19 126/79

## 2020-06-03 ENCOUNTER — Telehealth (INDEPENDENT_AMBULATORY_CARE_PROVIDER_SITE_OTHER): Payer: Medicare Other | Admitting: Family Medicine

## 2020-06-03 DIAGNOSIS — F329 Major depressive disorder, single episode, unspecified: Secondary | ICD-10-CM | POA: Diagnosis not present

## 2020-06-03 DIAGNOSIS — F411 Generalized anxiety disorder: Secondary | ICD-10-CM | POA: Diagnosis not present

## 2020-06-03 NOTE — Progress Notes (Signed)
MyChart Video Visit    Virtual Visit via Video Note   This visit type was conducted due to national recommendations for restrictions regarding the COVID-19 Pandemic (e.g. social distancing) in an effort to limit this patient's exposure and mitigate transmission in our community. This patient is at least at moderate risk for complications without adequate follow up. This format is felt to be most appropriate for this patient at this time. Physical exam was limited by quality of the video and audio technology used for the visit.   Patient location: home Provider location: b[facility]  I discussed the limitations of evaluation and management by telemedicine and the availability of in person appointments. The patient expressed understanding and agreed to proceed.  Patient: Sharon Bryan   DOB: [birth year]   55 y.o. Female  MRN: [id number] Visit Date: 06/03/2020  Today's healthcare provider: Lelon Huh, MD   No chief complaint on file.  Subjective    HPI   Patient would like to discuss a new psychiatric medication she was put on and if it will interact with any of her current medications as she is wondering if this treatment plan is safe. Patient denies any side effects from new medication. Her uncertainty stems from seeing a different phychiatric provider she currently sees at the psychiatric office and is why she would like to also receive a referral to be put with Aline August, PA again if feasible. Was prescribed propranolol since she was having severe nightmares and depression. Then she developed some URI symptoms and thought it was related to the medicine. She advised the NP nd prazosin. She is a previous patient of Bevelyn Buckles, but was not able to get in to see her for several years  Her primary issue today is that she does not feel comfortable with NP she is currently seeing at [place] due to medications changes, and she would to re-establish with Aline August who  she found to be much more helpful. Apparently she is unable to get [place] to make an appointment with Margaretha Sheffield with a referral.      Medications: Outpatient Medications Prior to Visit  Medication Sig  . ALPRAZolam (XANAX) 1 MG tablet TAKE 1/2 TO 1 TABLET BY MOUTH EVERY 8 HOURS AS NEEDED  . amitriptyline (ELAVIL) 25 MG tablet Take 2-3 tablets (50-75 mg total) by mouth at bedtime.  . Aspirin-Acetaminophen-Caffeine (EXCEDRIN PO) Take 1 tablet daily as needed by mouth.  Marland Kitchen buPROPion (WELLBUTRIN XL) 150 MG 24 hr tablet TAKE 1 TABLET(150 MG) BY MOUTH EVERY MORNING  . busPIRone (BUSPAR) 30 MG tablet TAKE 1 TABLET BY MOUTH TWICE DAILY  . estradiol (ESTRACE) 2 MG tablet TAKE 1 TABLET(2 MG) BY MOUTH DAILY  . lamoTRIgine (LAMICTAL) 100 MG tablet TAKE 1 TABLET BY MOUTH TWICE DAILY  . Multiple Vitamins-Minerals (CENTRUM SILVER PO) Take by mouth.  Marland Kitchen omeprazole (PRILOSEC) 20 MG capsule TAKE 1 CAPSULE BY MOUTH EVERY DAY  . ondansetron (ZOFRAN) 4 MG tablet Take 1 tablet (4 mg total) every 8 (eight) hours as needed by mouth for nausea or vomiting.  Marland Kitchen PARoxetine (PAXIL) 30 MG tablet Take 2 tablets (60 mg total) by mouth daily.  . phenytoin (DILANTIN) 100 MG ER capsule Take 2 capsules (200 mg total) by mouth 2 (two) times daily.  . propranolol (INDERAL) 10 MG tablet Take 10 mg by mouth at bedtime.  . fluticasone (FLONASE) 50 MCG/ACT nasal spray Place 2 sprays into both nostrils daily.  . Magnesium Oxide 420 MG  TABS Take 400 mg by mouth daily.  . meloxicam (MOBIC) 15 MG tablet TAKE 1 TABLET BY MOUTH EVERY DAY AS NEEDED FOR PAIN  . neomycin-polymyxin-hydrocortisone (CORTISPORIN) 3.5-10000-1 OTIC suspension SHAKE LIQUID AND INSTILL 3 TO 4 DROPS TO AFFECTED EAR FOUR TIMES DAILY  . pregabalin (LYRICA) 225 MG capsule Take 1 capsule (225 mg total) by mouth 2 (two) times daily.   No facility-administered medications prior to visit.    Review of Systems    Objective    LMP 04/26/2002 (LMP Unknown)     Physical Exam   Awake, alert, oriented x 3. In no apparent distress   Assessment & Plan     1. Generalized anxiety disorder   2. Depressive disorder  Is not doing well with management of current PA she is seeing at [facility]. Will set referral to re-establish specifically with Aline August.        I discussed the assessment and treatment plan with the patient. The patient was provided an opportunity to ask questions and all were answered. The patient agreed with the plan and demonstrated an understanding of the instructions.   The patient was advised to call back or seek an in-person evaluation if the symptoms worsen or if the condition fails to improve as anticipated.  I provided 10 minutes of non-face-to-face time during this encounter.  The entirety of the information documented in the History of Present Illness, Review of Systems and Physical Exam were personally obtained by me. Portions of this information were initially documented by the CMA and reviewed by me for thoroughness and accuracy.     Lelon Huh, MD [facility] [phone] (phone) [phone] (fax)  [place]

## 2020-06-08 ENCOUNTER — Other Ambulatory Visit: Payer: Self-pay | Admitting: Family Medicine

## 2020-06-08 NOTE — Telephone Encounter (Signed)
Requested medications are due for refill today.  yes  Requested medications are on the active medications list.  yes  Last refill. 04/17/2019  Future visit scheduled.   no  Notes to clinic.  Medication not delegated.

## 2020-06-09 MED ORDER — MELOXICAM 15 MG PO TABS: 15.0000 mg | ORAL_TABLET | Freq: Every day | ORAL | 1 refills | Status: DC | PRN

## 2020-06-23 ENCOUNTER — Encounter: Payer: Self-pay | Admitting: Family Medicine

## 2020-06-23 ENCOUNTER — Telehealth: Payer: Self-pay

## 2020-06-23 DIAGNOSIS — F411 Generalized anxiety disorder: Secondary | ICD-10-CM

## 2020-06-23 DIAGNOSIS — F32A Depression, unspecified: Secondary | ICD-10-CM

## 2020-06-23 DIAGNOSIS — F329 Major depressive disorder, single episode, unspecified: Secondary | ICD-10-CM

## 2020-06-23 NOTE — Telephone Encounter (Signed)
Mr. Sharon Bryan per [facility] called and was very loud and ugly to the [facility] agent. I spoke to Mr. Sharon Bryan and he stated he was pt's husband. He wanted an update about the referral that Dr. Caryn Section was going to place for pt to see Margaretha Sheffield with [facility]. I spoke with Judson Roch and she stated a referral had not been placed. Please advise and update pt. TNP

## 2020-06-23 NOTE — Telephone Encounter (Signed)
Copied from [place] [id number]. Topic: General - Other >> Jun 23, 2020  3:45 PM Tessa Lerner A wrote: Reason for CRM: Patient's husband has made contact requesting to speak with staff member "Judson Roch" when available   Patient's husband has concerns related to patient's behavioral health that they would like to discuss further  Please contact to further advise when possible

## 2020-06-23 NOTE — Telephone Encounter (Signed)
See MyChart message

## 2020-07-08 DIAGNOSIS — F411 Generalized anxiety disorder: Secondary | ICD-10-CM | POA: Diagnosis not present

## 2020-07-10 ENCOUNTER — Telehealth: Payer: Self-pay

## 2020-07-10 DIAGNOSIS — F32A Depression, unspecified: Secondary | ICD-10-CM

## 2020-07-10 DIAGNOSIS — F411 Generalized anxiety disorder: Secondary | ICD-10-CM

## 2020-07-10 NOTE — Telephone Encounter (Signed)
Copied from [place] [id number]. Topic: Referral - Status >> Jul 10, 2020 10:02 AM Yvette Rack wrote: Reason for CRM: Pt stated she needs Dr. Caryn Section or his nurse to return her call regarding the referral to psychiatrist.

## 2020-07-10 NOTE — Telephone Encounter (Signed)
I recommend Dr. Chucky May. She's in [place], but I think she is doing mostly virtual visits right now. Can place order if she'd like.

## 2020-07-10 NOTE — Telephone Encounter (Signed)
Patient advised and agrees to referral. Please schedule appointment.

## 2020-07-10 NOTE — Telephone Encounter (Signed)
I called and spoke with patient. She states she had an appointment with psychiatrist Sharon Bryan this past week. She went to the appointment, but was turned away because their office told her that they had her mixed up with another patient. Her appointment was rescheduled to this upcomming Monday. Patient states the secretary called her back when she got home and told her that the psychiatrist Sharon Bryan did not want to take her back as a patient and she would not be able to establish care with Sharon Bryan. Patent requested that our office call to find out why they wouldn't see her again as a patient. Patient states they didn't give her a reason why, they just told her that Sharon Bryan didn't want to reestablish her as a patient again. I advised patient she would need to contact psychiatry and ask about their reasoning. Patient verbalized understanding. Patient would like to know if Dr. Caryn Bryan has any other recommendations for psychiatrist? Patient says her husband is looking and researching other psychiatrist, but would like to know who Dr. Caryn Bryan recommends. Please advise.

## 2020-07-11 ENCOUNTER — Other Ambulatory Visit: Payer: Self-pay | Admitting: Family Medicine

## 2020-07-12 ENCOUNTER — Other Ambulatory Visit: Payer: Self-pay | Admitting: Family Medicine

## 2020-07-12 DIAGNOSIS — G40909 Epilepsy, unspecified, not intractable, without status epilepticus: Secondary | ICD-10-CM

## 2020-07-12 NOTE — Telephone Encounter (Signed)
Requested medication (s) are due for refill today: yes  Requested medication (s) are on the active medication list: yes  Last refill:  03/27/20  Future visit scheduled: yes  Notes to clinic:  med not delegated to NT to RF   Requested Prescriptions  Pending Prescriptions Disp Refills   phenytoin (DILANTIN) 100 MG ER capsule [Pharmacy Med Name: PHENYTOIN SODIUM 100MG  EXT CAPSULES] 180 capsule 1    Sig: TAKE 2 CAPSULES(200 MG) BY MOUTH TWICE DAILY      Not Delegated - Neurology:  Anticonvulsants - phenytoin Failed - 07/12/2020 10:10 AM      Failed - This refill cannot be delegated      Failed - Phenytoin (serum) in normal range and within 360 days    Phenytoin (Dilantin), Serum  Date Value Ref Range Status  02/25/2020 7.4 (L) 10.0 - 20.0 ug/mL Final    Comment:                                    Detection Limit =  0.8                           <0.8 Indicates None Detected   03/17/2015 2.4 (L) 10.0 - 20.0 ug/mL Final    Comment:                                    Neonatal:                                 Therapeutic 6.0 - 14.0                                 Detection Limit =  0.8                           <0.8 Indicates None Detected    Phenytoin, Free  Date Value Ref Range Status  08/07/2014 None Detected 1.0 - 2.0 ug/mL Final    Comment:                                    Detection Limit = 0.5          Passed - ALT in normal range and within 360 days    ALT  Date Value Ref Range Status  02/25/2020 26 0 - 32 IU/L Final          Passed - AST in normal range and within 360 days    AST  Date Value Ref Range Status  02/25/2020 21 0 - 40 IU/L Final          Passed - HGB in normal range and within 360 days    Hemoglobin  Date Value Ref Range Status  02/25/2020 12.7 11.1 - 15.9 g/dL Final          Passed - HCT in normal range and within 360 days    Hematocrit  Date Value Ref Range Status  02/25/2020 36.4 34.0 - 46.6 % Final          Passed - PLT in  normal  range and within 360 days    Platelets  Date Value Ref Range Status  02/25/2020 326 150 - 450 x10E3/uL Final          Passed - WBC in normal range and within 360 days    WBC  Date Value Ref Range Status  02/25/2020 7.2 3.4 - 10.8 x10E3/uL Final  09/18/2012 8.9 3.6 - 11.0 x10 3/mm 3 Final          Passed - Valid encounter within last 12 months    Recent Outpatient Visits           1 month ago Generalized anxiety disorder   [facility] Birdie Sons, MD   4 months ago Insomnia, unspecified type   [facility] Birdie Sons, MD   8 months ago Acute cystitis without hematuria   [facility] Birdie Sons, MD   1 year ago Change in facial mole   [facility] Trinna Post, [place]   1 year ago Seizure disorder [facility])   [facility] Birdie Sons, MD       Future Appointments             In 1 month Fisher, Kirstie Peri, MD [facility], [place]

## 2020-07-12 NOTE — Telephone Encounter (Signed)
Requested medication (s) are due for refill today: no  Requested medication (s) are on the active medication list: yes  Last refill:  01/22/20 #90 4 RF  Future visit scheduled: yes  Notes to clinic:  med cannot be delegated to NT to RF   Requested Prescriptions  Pending Prescriptions Disp Refills   ALPRAZolam (XANAX) 1 MG tablet [Pharmacy Med Name: ALPRAZOLAM 1MG  TABLETS] 90 tablet     Sig: TAKE 1/2 TO 1 TABLET BY MOUTH EVERY 8 HOURS AS NEEDED      Not Delegated - Psychiatry:  Anxiolytics/Hypnotics Failed - 07/11/2020 11:51 PM      Failed - This refill cannot be delegated      Failed - Urine Drug Screen completed in last 360 days      Failed - Valid encounter within last 6 months    Recent Outpatient Visits           1 month ago Generalized anxiety disorder   [facility] Birdie Sons, MD   4 months ago Insomnia, unspecified type   [facility] Birdie Sons, MD   8 months ago Acute cystitis without hematuria   [facility] Birdie Sons, MD   1 year ago Change in facial mole   [facility] Trinna Post, [place]   1 year ago Seizure disorder [facility])   [facility] Birdie Sons, MD       Future Appointments             In 1 month Fisher, Kirstie Peri, MD [facility], [place]

## 2020-07-13 ENCOUNTER — Telehealth: Payer: Self-pay | Admitting: Family Medicine

## 2020-07-13 NOTE — Telephone Encounter (Signed)
Provider Radonna Ricker from [place] [facility] needs to get in touch with Dr. Caryn Section / she has some concerns/ she ask that he please call CB# [phone] today after 3pm/ this is her cell phone

## 2020-07-18 ENCOUNTER — Encounter: Payer: Self-pay | Admitting: Family Medicine

## 2020-07-29 ENCOUNTER — Encounter: Payer: Self-pay | Admitting: Family Medicine

## 2020-07-29 DIAGNOSIS — F411 Generalized anxiety disorder: Secondary | ICD-10-CM

## 2020-07-29 MED ORDER — PROPRANOLOL HCL 10 MG PO TABS: 10.0000 mg | ORAL_TABLET | Freq: Every day | ORAL | 3 refills | Status: DC

## 2020-08-19 ENCOUNTER — Telehealth: Payer: Medicare Other | Admitting: Family Medicine

## 2020-08-21 ENCOUNTER — Telehealth: Payer: Medicare Other | Admitting: Family Medicine

## 2020-08-28 ENCOUNTER — Other Ambulatory Visit: Payer: Self-pay | Admitting: Family Medicine

## 2020-08-28 DIAGNOSIS — G4700 Insomnia, unspecified: Secondary | ICD-10-CM

## 2020-09-02 ENCOUNTER — Ambulatory Visit (INDEPENDENT_AMBULATORY_CARE_PROVIDER_SITE_OTHER): Payer: Medicare Other | Admitting: Psychology

## 2020-09-02 DIAGNOSIS — F3289 Other specified depressive episodes: Secondary | ICD-10-CM | POA: Diagnosis not present

## 2020-09-03 DIAGNOSIS — R7989 Other specified abnormal findings of blood chemistry: Secondary | ICD-10-CM | POA: Diagnosis not present

## 2020-09-03 DIAGNOSIS — E039 Hypothyroidism, unspecified: Secondary | ICD-10-CM | POA: Diagnosis not present

## 2020-09-04 ENCOUNTER — Other Ambulatory Visit: Payer: Self-pay | Admitting: Family Medicine

## 2020-09-04 LAB — T4, FREE: Free T4: 0.92 ng/dL (ref 0.82–1.77)

## 2020-09-04 LAB — TSH: TSH: 4.83 u[IU]/mL — ABNORMAL HIGH (ref 0.450–4.500)

## 2020-09-04 MED ORDER — LEVOTHYROXINE SODIUM 75 MCG PO TABS: 75.0000 ug | ORAL_TABLET | Freq: Every day | ORAL | 0 refills | Status: DC

## 2020-09-17 ENCOUNTER — Ambulatory Visit (INDEPENDENT_AMBULATORY_CARE_PROVIDER_SITE_OTHER): Payer: Medicare Other | Admitting: Psychology

## 2020-09-17 DIAGNOSIS — F3289 Other specified depressive episodes: Secondary | ICD-10-CM

## 2020-09-26 ENCOUNTER — Other Ambulatory Visit: Payer: Self-pay | Admitting: Family Medicine

## 2020-09-27 ENCOUNTER — Other Ambulatory Visit: Payer: Self-pay | Admitting: Family Medicine

## 2020-09-27 DIAGNOSIS — G4700 Insomnia, unspecified: Secondary | ICD-10-CM

## 2020-09-27 NOTE — Telephone Encounter (Signed)
Requested medication (s) are due for refill today: yes  Requested medication (s) are on the active medication list: yes  Last refill:  Paxil: 03/31/20               Prilosec  12/24/19  Future visit scheduled: no  Notes to clinic:  called pt and LM on VM to make appt. Call back number provided   Requested Prescriptions  Pending Prescriptions Disp Refills   PARoxetine (PAXIL) 30 MG tablet [Pharmacy Med Name: PAROXETINE '30MG'$  TABLETS] 180 tablet 1    Sig: TAKE 2 TABLETS(60 MG) BY MOUTH DAILY     Psychiatry:  Antidepressants - SSRI Failed - 09/26/2020  7:12 AM      Failed - Valid encounter within last 6 months    Recent Outpatient Visits           3 months ago Generalized anxiety disorder   [place] [facility] Birdie Sons, MD   6 months ago Insomnia, unspecified type   [facility] Bird[facility], [facility]   10 months ago Acute cystitis without hematuria   [facility] Birdie Sons, MD   1 year ago Change in facial mole   [facility] Trinna Post, [place]   1 year ago Seizure disorder [facility])   [facility] Birdie Sons, MD              Passed - Completed PHQ-2 or PHQ-9 in the last 360 days       omeprazole (PRILOSEC) 20 MG capsule [Pharmacy Med Name: OMEPRAZOLE '20MG'$  CAPSULES] 90 capsule 2    Sig: TAKE 1 [place]     Gastroenterology: Proton Pump Inhibitors Passed - 09/26/2020  7:12 AM      Passed - Valid encounter within last 12 months    Recent Outpatient Visits           3 months ago Generalized anxiety disorder   [facility] Birdie Sons, MD   6 months ago Insomnia, unspecified type   [facility] Birdie Sons, MD   10 months ago Acute cystitis without hematuria   [facility] Birdie Sons, MD   1 year ago Change in facial mole   [facility] Trinna Post, [place]   1 year ago Seizure disorder [facility])    [facility] Caryn Section, Kirstie Peri, MD

## 2020-09-27 NOTE — Telephone Encounter (Signed)
Requested medication (s) are due for refill today: yes  Requested medication (s) are on the active medication list: yes  Last refill:  08/28/20  Future visit scheduled: no  Notes to clinic:  called pt and LM on VM to call backto make appt. Call back number provided   Requested Prescriptions  Pending Prescriptions Disp Refills   amitriptyline (ELAVIL) 25 MG tablet [Pharmacy Med Name: AMITRIPTYLINE '25MG'$  TABLETS] 90 tablet 0    Sig: TAKE 2 TO 3 TABLETS BY MOUTH AT BEDTIME     Psychiatry:  Antidepressants - Heterocyclics (TCAs) Failed - 09/27/2020  1:50 PM      Failed - Valid encounter within last 6 months    Recent Outpatient Visits           3 months ago Generalized anxiety disorder   [place], [place]   6 months ago Insomnia, unspecified type   [facility] Birdie Sons, MD   10 months ago Acute cystitis without hematuria   [facility] Birdie Sons, MD   1 year ago Change in facial mole   [facility] Trinna Post, [place]   1 year ago Seizure disorder [facility])   [facility] Birdie Sons, MD              Passed - Completed PHQ-2 or PHQ-9 in the last 360 days

## 2020-09-28 ENCOUNTER — Other Ambulatory Visit: Payer: Self-pay | Admitting: Family Medicine

## 2020-09-28 DIAGNOSIS — F411 Generalized anxiety disorder: Secondary | ICD-10-CM

## 2020-10-01 ENCOUNTER — Ambulatory Visit (INDEPENDENT_AMBULATORY_CARE_PROVIDER_SITE_OTHER): Payer: Medicare Other | Admitting: Psychology

## 2020-10-01 DIAGNOSIS — F3289 Other specified depressive episodes: Secondary | ICD-10-CM | POA: Diagnosis not present

## 2020-10-08 ENCOUNTER — Other Ambulatory Visit: Payer: Self-pay | Admitting: Family Medicine

## 2020-10-08 ENCOUNTER — Ambulatory Visit (INDEPENDENT_AMBULATORY_CARE_PROVIDER_SITE_OTHER): Payer: Medicare Other | Admitting: Psychology

## 2020-10-08 DIAGNOSIS — F3289 Other specified depressive episodes: Secondary | ICD-10-CM

## 2020-10-08 DIAGNOSIS — E039 Hypothyroidism, unspecified: Secondary | ICD-10-CM

## 2020-10-22 ENCOUNTER — Ambulatory Visit: Payer: Medicare Other | Admitting: Psychology

## 2020-10-24 ENCOUNTER — Other Ambulatory Visit: Payer: Self-pay | Admitting: Family Medicine

## 2020-10-25 NOTE — Telephone Encounter (Signed)
Requested medication (s) are due for refill today: no  Requested medication (s) are on the active medication list: yes  Last refill:  04/02/20 #180 5 RF  Future visit scheduled: no  Notes to clinic:  med not delegated to NT    Requested Prescriptions  Pending Prescriptions Disp Refills   LYRICA 225 MG capsule [Pharmacy Med Name: LYRICA 225MG ** CAPSULES] 180 capsule     Sig: TAKE 1 CAPSULE(225 MG) BY MOUTH TWICE DAILY     Not Delegated - Neurology:  Anticonvulsants - Controlled Failed - 10/24/2020  9:55 PM      Failed - This refill cannot be delegated      Passed - Valid encounter within last 12 months    Recent Outpatient Visits           4 months ago Generalized anxiety disorder   [facility] Birdie Sons, MD   7 months ago Insomnia, unspecified type   [facility] Birdie Sons, MD   11 months ago Acute cystitis without hematuria   [facility] Birdie Sons, MD   1 year ago Change in facial mole   [facility] Trinna Post, [place]   1 year ago Seizure disorder [facility])   [facility] Caryn Section, Kirstie Peri, MD

## 2020-11-09 ENCOUNTER — Ambulatory Visit (INDEPENDENT_AMBULATORY_CARE_PROVIDER_SITE_OTHER): Payer: Medicare Other | Admitting: Psychology

## 2020-11-09 DIAGNOSIS — F3289 Other specified depressive episodes: Secondary | ICD-10-CM | POA: Diagnosis not present

## 2020-11-23 ENCOUNTER — Ambulatory Visit (INDEPENDENT_AMBULATORY_CARE_PROVIDER_SITE_OTHER): Payer: Medicare Other | Admitting: Psychology

## 2020-11-23 DIAGNOSIS — F3289 Other specified depressive episodes: Secondary | ICD-10-CM

## 2020-11-25 ENCOUNTER — Other Ambulatory Visit: Payer: Self-pay | Admitting: Family Medicine

## 2020-11-25 DIAGNOSIS — R232 Flushing: Secondary | ICD-10-CM

## 2020-11-25 NOTE — Telephone Encounter (Signed)
Call to patient- left message to call office for appointment for follow up. Rx sent by provider today 30 day- refused 90 day quest.

## 2020-12-07 ENCOUNTER — Ambulatory Visit (INDEPENDENT_AMBULATORY_CARE_PROVIDER_SITE_OTHER): Payer: Medicare Other | Admitting: Psychology

## 2020-12-07 DIAGNOSIS — F3289 Other specified depressive episodes: Secondary | ICD-10-CM | POA: Diagnosis not present

## 2020-12-24 ENCOUNTER — Ambulatory Visit: Payer: Medicare Other | Admitting: Psychology

## 2020-12-25 ENCOUNTER — Other Ambulatory Visit: Payer: Self-pay | Admitting: Family Medicine

## 2020-12-25 DIAGNOSIS — G40909 Epilepsy, unspecified, not intractable, without status epilepticus: Secondary | ICD-10-CM

## 2020-12-25 MED ORDER — PHENYTOIN SODIUM EXTENDED 100 MG PO CAPS
200.0000 mg | ORAL_CAPSULE | Freq: Two times a day (BID) | ORAL | 1 refills | Status: DC
Start: 2020-12-25 — End: 2021-03-31

## 2020-12-25 NOTE — Telephone Encounter (Signed)
[facility] faxed refill request for the following medications:  phenytoin (DILANTIN) 100 MG ER capsule   Please advise.

## 2020-12-25 NOTE — Addendum Note (Signed)
Addended by: Ashley Royalty E on: 12/25/2020 11:30 AM   Modules accepted: Orders

## 2021-01-03 ENCOUNTER — Ambulatory Visit (INDEPENDENT_AMBULATORY_CARE_PROVIDER_SITE_OTHER): Payer: Medicare Other | Admitting: Psychology

## 2021-01-03 DIAGNOSIS — F3289 Other specified depressive episodes: Secondary | ICD-10-CM

## 2021-01-03 DIAGNOSIS — F32A Depression, unspecified: Secondary | ICD-10-CM | POA: Insufficient documentation

## 2021-01-03 NOTE — Progress Notes (Signed)
[facility] Counselor/Therapist Progress Note  Patient ID: Sharon Bryan, MRN: [id number]    Date: 01/03/21  Time Spent: 4:07 pm - 4:55 pm : 48 Minutes  Treatment Type: Individual Therapy.  Reported Symptoms: Grief, Sadness, rumination.  Mental Status Exam: Appearance:  Neat and Well Groomed     Behavior: Appropriate  Motor: Normal  Speech/Language:  Clear and Coherent and Normal Rate  Affect: Depressed  Mood: depressed  Thought process: normal  Thought content:   WNL  Sensory/Perceptual disturbances:   WNL  Orientation: oriented to person, place, time/date, and situation  Attention: Good  Concentration: Good  Memory: WNL  Fund of knowledge:  Good  Insight:   Good  Judgment:  Good  Impulse Control: Good   Risk Assessment: Danger to Self:  No Self-injurious Behavior: No Danger to Others: No Duty to Warn:no Physical Aggression / Violence:No  Access to Firearms a concern: Yes  Gang Involvement:No   Subjective:   Sharon Bryan participated from home, via video, and consented to treatment. Therapist participated from home office. We met online due to [place] pandemic. Sharon Bryan reviewed the events of the past week.  Sharon Bryan discussed Christmastime being a difficult time of year for her.  She noted numerous transitions and traumas during this time including her mother's passing around Christmas time which is in close proximity to her mother's birthday.  Additional stressors include childhood events specifically her mother taking her away from the family home placing her in the care of family and being away from [place] for longer periods of time including some Christmases.  Sharon Bryan noted that her first husband left her around Christmastime as well.  We began exploring stressors during session including her relationship with her mother and how change between childhood and adulthood.  Patient noted her attempts to discuss certain childhood events with her  mother and adulthood and receiving very little information or feedback.  Sharon Bryan lamented that she will never get this information due to her mother's passing.  Sharon Bryan noted growing up with household where her father was unfaithful to her mother on numerous occasions which resulted in her mother taking Sharon Bryan away from her father and the family home often in the middle of the night.  She reflects on additional family stressors including her previously strained relationships with both her sons, including being estranged from her son Sharon Bryan for 5 years.  Therapist validated Sharon Bryan experience & feelings.  Sharon Bryan noted staying in her home, specifically her living room, with the shades closed and not interacting socially during this time of year we began to explore this and employed CBT principles to identify the thoughts feelings and behaviors related to this phenomenon.  Sharon Bryan was engaged and motivated during the session.  She expressed commitment towards her goals.  Therapist encouraged Sharon Bryan to begin to set boundaries over rumination regarding these past life events and to take breaks and engage in healthful activities in lieu of rumination.  Sharon Bryan expressed her commitment to this goal between sessions.  Therapist validated and normalized Sharon Bryan's experience and provided supportive therapy. Follow-up appointments were scheduled for additional treatment.    Interventions: Cognitive Behavioral Therapy  Diagnosis: Other depression   Plan: Patient is to use CBT, BA, Problem-solving, mindfulness and coping skills to help manage decrease symptoms associated with their diagnosis.  Related Problem: Learn and implement coping skills that result in a reduction of anxiety and worry, and improved daily functioning. Description: Cooperate with a medication evaluation by a physician. Target Date: 2021-09-17  Progress: 0%  Related Problem: Learn and implement coping skills that result in a reduction of  anxiety and worry, and improved daily functioning. Description: Learn and implement calming skills to reduce overall anxiety and manage anxiety symptoms. Target Date: 2021-09-17 Progress: 0%  Related Problem: Learn and implement coping skills that result in a reduction of anxiety and worry, and improved daily functioning. Description: Learn and implement a strategy to limit the association between various environmental settings and worry, delaying the worry until a designated "worry time." Target Date: 2021-09-17 Progress: 0%  Related Problem: Learn and implement coping skills that result in a reduction of anxiety and worry, and improved daily functioning. Description: Identify, challenge, and replace biased, fearful self-talk with positive, realistic, and empowering self-talk. Target Date: 2021-09-17 Progress: 0%  Related Problem: Learn and implement coping skills that result in a reduction of anxiety and worry, and improved daily functioning. Description: Learn and implement problem-solving strategies for realistically addressing worries. Target Date: 2021-09-17 Progress: 0%  Related Problem: Learn and implement coping skills that result in a reduction of anxiety and worry, and improved daily functioning. Description: Identify and engage in pleasant activities on a daily basis. Target Date: 2021-09-17 Progress: 0%  Related Problem: Learn and implement coping skills that result in a reduction of anxiety and worry, and improved daily functioning. Description: Learn and implement personal and interpersonal skills to reduce anxiety and improve interpersonal relationships. Target Date: 2021-09-17 Progress: 0%  Related Problem: Learn and implement coping skills that result in a reduction of anxiety and worry, and improved daily functioning. Description: Identify the major life conflicts from the past and present that form the basis for present anxiety. Target Date: 2021-09-17 Progress:  0%  Related Problem: Learn and implement coping skills that result in a reduction of anxiety and worry, and improved daily functioning. Description: Reestablish a consistent sleep-wake cycle. Target Date: 2021-09-17 Progress: 0%   [name], LCSW

## 2021-01-04 ENCOUNTER — Other Ambulatory Visit: Payer: Self-pay | Admitting: Family Medicine

## 2021-01-04 MED ORDER — ALPRAZOLAM 1 MG PO TABS: ORAL_TABLET | ORAL | 0 refills | Status: DC

## 2021-01-04 NOTE — Telephone Encounter (Signed)
Last refill: 07/12/2020, #90 with 3 refills  Next office visit: No future visit scheduled Last office visit: 06/03/2020

## 2021-01-04 NOTE — Telephone Encounter (Signed)
[facility] faxed refill request for the following medications:  ALPRAZolam (XANAX) 1 MG tablet   Please advise.

## 2021-01-07 ENCOUNTER — Ambulatory Visit (INDEPENDENT_AMBULATORY_CARE_PROVIDER_SITE_OTHER): Payer: Medicare Other | Admitting: Psychology

## 2021-01-07 DIAGNOSIS — F3289 Other specified depressive episodes: Secondary | ICD-10-CM | POA: Diagnosis not present

## 2021-01-07 NOTE — Progress Notes (Signed)
[facility] Counselor/Therapist Progress Note  Patient ID: Sharon Bryan, MRN: [id number]    Date: 01/07/21  Time Spent: 11:03 am - 11:52 pm : 72 Minutes  Treatment Type: Individual Therapy.  Reported Symptoms: Sadness, anxiety, rumination.    Mental Status Exam:  Appearance:  Neat and Well Groomed     Behavior: Appropriate  Motor: Normal  Speech/Language:  Clear and Coherent and Normal Rate  Affect: Depressed  Mood: depressed  Thought process: normal  Thought content:   WNL  Sensory/Perceptual disturbances:   WNL  Orientation: oriented to person, place, time/date, and situation  Attention: Good  Concentration: Good  Memory: WNL  Fund of knowledge:  Good  Insight:   Good  Judgment:  Good  Impulse Control: Good   Risk Assessment: Danger to Self:  No Self-injurious Behavior: No Danger to Others: No Duty to Warn:no Physical Aggression / Violence:No  Access to Firearms a concern: Yes  Gang Involvement:No   Subjective:   Sharon Bryan participated from home, via video, and consented to treatment. Therapist participated from home office. We met online due to [place] pandemic. Sharon Bryan reviewed the events of the past week.  She discussed the lack of traction regarding progress from our last session.  Therapist provided psychoeducation regarding therapy treatment.  Therapist discussed the importance of setting reasonable expectations regarding progress and change.  Sharon Bryan discussed her attempts to make some minor behavioral changes including increasing her exercise at times and employing the use of positive distractors such as uplifting music, as a way to work on managing stress, which therapist praised.  Sharon Bryan discussed a recent insight she had between sessions specifically her history with her son who has 2 children.  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  She identified his feelings as feelings of abandonment and highlighted his feelings and in relation to her own childhood when she felt abandoned by her mother.  Therapist praised Sharon Bryan for her effort between sessions to identify her feelings.  We explored her feelings of abandonment during session and Sharon Bryan noted feeling guarded as a result of this abandonment she experienced both in childhood and adulthood.  She did endorse some rumination regarding these concerns.  Therapist employed CPT principles and interventions to identify negative thoughts work on managing rumination via boundary setting for self, managing negative self talk ,engaging in positive self talk, and the use of healthy distractions.  Additionally, therapist employed ACT principles to assist Sharon Bryan in highlighting areas of control and lack of control regarding this topic.  We discussed the importance of setting boundaries on rumination and using positive and helpful coping skills to manage distress that accompanies rumination.  Therapist modeled this during the session.  Sharon Bryan was engaged and motivated during the session.  She expressed her commitment towards her goals and employing the tools discussed in the session in previous sessions. Therapist praised Sharon Bryan for her effort and energy during the session.  Therapist highlighted Sharon Bryan's progress during treatment and provided supportive   Interventions: Cognitive Behavioral Therapy and ACT  Diagnosis: Other depression   Treatment Plan:  Client Abilities/Strengths Sharon Bryan is forthcoming and motivated for change.   Client Treatment Preferences Outpatient Therapy.   Client Statement of Needs Sharon Bryan discussed her goals for treatment including engagement in previously enjoyable activities, managing her overall mood and symptoms, improving her sleep, reduce rumination, improving self-talk, and process past  events (including traumatic events).    Treatment Level Weekly  Symptoms Depression: Loss of interest, feeling depressed, lethargy, overeating, feeling bad about self, trouble concentrating, denied SI (hx w/o hospitalization). (Status: maintained)  Anxiety: Feeling anxious, difficulty managing worrying, worry about different things, difficulty relaxing, restlessness, irritability, feeling bad something awful might happen. (Status: maintained)  Goals:   Sharon Bryan experiences symptoms of anxiety with depressive symptoms.    Target Date: 09/17/21 Frequency: Weekly  Progress: 0 Modality: individual    Therapist will provide referrals for additional resources as appropriate.  Therapist will provide psycho-education regarding Sharon Bryan's diagnosis and corresponding treatment approaches and interventions. Sharon Bryan will employ CBT, BA, Problem-solving, Solution Focused, Mindfulness, and coping skills to help manage decrease symptoms associated with her diagnosis.   Reduce overall level, frequency, and intensity of the feelings of depression & anxiety as evidenced by a decrease in symptoms of depression and anxiety  from 6 to 7 days/week to 0 to 1 days/week per client report for at least 3 consecutive months. Verbally express understanding of the relationship between feelings of depression, anxiety and their impact on thinking patterns and behaviors. Verbalize an understanding of the role that distorted thinking plays in creating fears, excessive worry, and ruminations.  Sharon Bryan participated in the creation of the treatment plan)  [name], LCSW

## 2021-01-08 ENCOUNTER — Encounter: Payer: Self-pay | Admitting: Family Medicine

## 2021-01-08 ENCOUNTER — Telehealth (INDEPENDENT_AMBULATORY_CARE_PROVIDER_SITE_OTHER): Payer: Medicare Other | Admitting: Family Medicine

## 2021-01-08 DIAGNOSIS — M791 Myalgia, unspecified site: Secondary | ICD-10-CM | POA: Diagnosis not present

## 2021-01-08 MED ORDER — CEFDINIR 300 MG PO CAPS: 600.0000 mg | ORAL_CAPSULE | Freq: Every day | ORAL | 0 refills | Status: DC

## 2021-01-08 NOTE — Progress Notes (Signed)
MyChart Video Visit    Virtual Visit via Video Note   This visit type was conducted due to national recommendations for restrictions regarding the COVID-19 Pandemic (e.g. social distancing) in an effort to limit this patient's exposure and mitigate transmission in our community. This patient is at least at moderate risk for complications without adequate follow up. This format is felt to be most appropriate for this patient at this time. Physical exam was limited by quality of the video and audio technology used for the visit.   Patient location: home Provider location: [facility]  I discussed the limitations of evaluation and management by telemedicine and the availability of in person appointments. The patient expressed understanding and agreed to proceed.  Patient: Sharon Bryan   DOB: Aug 13, 1965   55 y.o. Female  MRN: [id number] Visit Date: 01/08/2021  Today's healthcare provider: Lelon Huh, MD   Chief Complaint  Patient presents with   Generalized Body Aches   Subjective    HPI   Pt woke up a few days ago with body aches, sweats/chills, headaches.  No coughing, running nose, or sinus congestion  symptoms, GI complains, and fevers. At first she thought it was her fibromyalgia. Taken tylenol and ibuprofen which helps for a few hours. No nausea, vomiting Hurts on left side off and on.  States she had increased urination and burning with urination 4-5 days ago. Now having some aching on the life side of her back. No covid test done. Last Covid vaccine was about 4-5 months ago.     Medications: Outpatient Medications Prior to Visit  Medication Sig   ALPRAZolam (XANAX) 1 MG tablet TAKE 1/2 TO 1 TABLET BY MOUTH EVERY 8 HOURS AS NEEDED   amitriptyline (ELAVIL) 25 MG tablet TAKE 2 TO 3 TABLETS BY MOUTH AT BEDTIME   Aspirin-Acetaminophen-Caffeine (EXCEDRIN PO) Take 1 tablet daily as needed by mouth.   buPROPion (WELLBUTRIN XL) 150 MG 24 hr tablet TAKE 1 TABLET(150 MG) BY  MOUTH EVERY MORNING   busPIRone (BUSPAR) 30 MG tablet TAKE 1 TABLET BY MOUTH TWICE DAILY   estradiol (ESTRACE) 2 MG tablet TAKE 1 TABLET(2 MG) BY MOUTH DAILY   lamoTRIgine (LAMICTAL) 100 MG tablet TAKE 1 TABLET BY MOUTH TWICE DAILY   levothyroxine (SYNTHROID) 75 MCG tablet TAKE 1 TABLET(75 MCG) BY MOUTH DAILY   LYRICA 225 MG capsule TAKE 1 CAPSULE(225 MG) BY MOUTH TWICE DAILY   Magnesium Oxide 420 MG TABS Take 400 mg by mouth daily.   meloxicam (MOBIC) 15 MG tablet TAKE 1 TABLET(15 MG) BY MOUTH DAILY AS NEEDED FOR PAIN   Multiple Vitamins-Minerals (CENTRUM SILVER PO) Take by mouth.   neomycin-polymyxin-hydrocortisone (CORTISPORIN) 3.5-10000-1 OTIC suspension SHAKE LIQUID AND INSTILL 3 TO 4 DROPS TO AFFECTED EAR FOUR TIMES DAILY   omeprazole (PRILOSEC) 20 MG capsule TAKE 1 CAPSULE BY MOUTH EVERY DAY   PARoxetine (PAXIL) 30 MG tablet TAKE 2 TABLETS(60 MG) BY MOUTH DAILY   phenytoin (DILANTIN) 100 MG ER capsule Take 2 capsules (200 mg total) by mouth 2 (two) times daily.   propranolol (INDERAL) 10 MG tablet Take 1 tablet (10 mg total) by mouth at bedtime.   fluticasone (FLONASE) 50 MCG/ACT nasal spray Place 2 sprays into both nostrils daily.   ondansetron (ZOFRAN) 4 MG tablet Take 1 tablet (4 mg total) every 8 (eight) hours as needed by mouth for nausea or vomiting.   No facility-administered medications prior to visit.    Review of Systems  Constitutional:  Positive for  chills, diaphoresis and fatigue. Negative for activity change, appetite change, fever and unexpected weight change.  HENT:  Negative for congestion, postnasal drip, rhinorrhea, sinus pressure, sinus pain, sneezing, sore throat, tinnitus, trouble swallowing and voice change.   Eyes: Negative.   Respiratory: Negative.    Gastrointestinal: Negative.   Musculoskeletal:  Positive for myalgias.  Neurological:  Positive for headaches. Negative for dizziness and light-headedness.     Objective    LMP 04/26/2002 (LMP Unknown)     Physical Exam   Awake, alert, oriented x 3. In no apparent distress   Assessment & Plan     1. Myalgia No respiratory sx consistent with influenza, RSV, or Covid. She did have dysuria and frequency around earlier this week. Will cover for UTI with - cefdinir (OMNICEF) 300 MG capsule; Take 2 capsules (600 mg total) by mouth daily for 10 days.  Dispense: 20 capsule; Refill: 0   Recommend she go to urgent care if not rapidly improving on antibiotic as above.      I discussed the assessment and treatment plan with the patient. The patient was provided an opportunity to ask questions and all were answered. The patient agreed with the plan and demonstrated an understanding of the instructions.   The patient was advised to call back or seek an in-person evaluation if the symptoms worsen or if the condition fails to improve as anticipated.  I provided 12 minutes of non-face-to-face time during this encounter.  The entirety of the information documented in the History of Present Illness, Review of Systems and Physical Exam were personally obtained by me. Portions of this information were initially documented by the CMA and reviewed by me for thoroughness and accuracy.    Lelon Huh, MD [facility] [phone] (phone) [phone] (fax)  [place]

## 2021-01-11 ENCOUNTER — Telehealth: Payer: Medicare Other | Admitting: Family Medicine

## 2021-01-19 ENCOUNTER — Ambulatory Visit (INDEPENDENT_AMBULATORY_CARE_PROVIDER_SITE_OTHER): Payer: Medicare Other | Admitting: Psychology

## 2021-01-19 ENCOUNTER — Telehealth: Payer: Medicare Other | Admitting: Family Medicine

## 2021-01-19 DIAGNOSIS — F3289 Other specified depressive episodes: Secondary | ICD-10-CM

## 2021-01-19 NOTE — Progress Notes (Signed)
[facility] Counselor/Therapist Progress Note  Patient ID: Sharon Bryan, MRN: [id number]    Date: 01/19/21  Time Spent: 2:10 pm - 2:56 pm : 46 Minutes  Treatment Type: Individual Therapy.  Reported Symptoms: Sadness, anxiety, rumination.    Mental Status Exam:  Appearance:  Neat and Well Groomed     Behavior: Appropriate  Motor: Normal  Speech/Language:  Clear and Coherent and Normal Rate  Affect: Depressed  Mood: depressed  Thought process: normal  Thought content:   WNL  Sensory/Perceptual disturbances:   WNL  Orientation: oriented to person, place, time/date, and situation  Attention: Good  Concentration: Good  Memory: WNL  Fund of knowledge:  Good  Insight:   Good  Judgment:  Good  Impulse Control: Good   Risk Assessment: Danger to Self:  No Self-injurious Behavior: No Danger to Others: No Duty to Warn:no Physical Aggression / Violence:No  Access to Firearms a concern: Yes  Gang Involvement:No   Subjective:   [name] participated from home, via video, and consented to treatment. Therapist participated from office. We met online due to [place] pandemic. Shavone reviewed the events of the past week. She discussed marital strain due to a lack of support with household chores by her husband. She noted recently being ill and not receiving support with the household chores. She discussed numerous strains in the relationship and her attempt to manage this via boundary setting, communication, and compromise. We explored these stressors during the session and the effects of them on her overall mood and functioning. [name] discussed her past attempts, which have been wholly unsuccessful. Therapist validated [name]'s feelings and experience. Therapist encouraged consideration of a marital counseling referral, which therapist would facilitate. Therapist will await insurance information from [place] to facilitate. Therapist provided supportive  therapy.   Interventions: Cognitive Behavioral Therapy and ACT  Diagnosis: Other depression   Treatment Plan:  Client Abilities/Strengths Ahnika is forthcoming and motivated for change.   Client Treatment Preferences Outpatient Therapy.   Client Statement of Needs Sheniah discussed her goals for treatment including engagement in previously enjoyable activities, managing her overall mood and symptoms, improving her sleep, reduce rumination, improving self-talk, and process past events (including traumatic events).   Treatment Level Weekly  Symptoms Depression: Loss of interest, feeling depressed, lethargy, overeating, feeling bad about self, trouble concentrating, denied SI (hx w/o hospitalization). (Status: maintained)  Anxiety: Feeling anxious, difficulty managing worrying, worry about different things, difficulty relaxing, restlessness, irritability, feeling bad something awful might happen. (Status: maintained)  Goals:   Kylan experiences symptoms of anxiety with depressive symptoms.    Target Date: 09/17/21 Frequency: Weekly  Progress: 0 Modality: individual    Therapist will provide referrals for additional resources as appropriate.  Therapist will provide psycho-education regarding Pierce's diagnosis and corresponding treatment approaches and interventions. Keziah will employ CBT, BA, Problem-solving, Solution Focused, Mindfulness, and coping skills to help manage decrease symptoms associated with her diagnosis.   Reduce overall level, frequency, and intensity of the feelings of depression & anxiety as evidenced by a decrease in symptoms of depression and anxiety  from 6 to 7 days/week to 0 to 1 days/week per client report for at least 3 consecutive months. Verbally express understanding of the relationship between feelings of depression, anxiety and their impact on thinking patterns and behaviors. Verbalize an understanding of the role that distorted thinking  plays in creating fears, excessive worry, and ruminations.  [name] participated in the creation of the treatment plan)  [name], LCSW

## 2021-01-21 ENCOUNTER — Encounter: Payer: Self-pay | Admitting: Family Medicine

## 2021-01-21 DIAGNOSIS — M791 Myalgia, unspecified site: Secondary | ICD-10-CM

## 2021-01-22 ENCOUNTER — Ambulatory Visit: Payer: Self-pay | Admitting: *Deleted

## 2021-01-22 ENCOUNTER — Other Ambulatory Visit: Payer: Self-pay | Admitting: Family Medicine

## 2021-01-22 DIAGNOSIS — M791 Myalgia, unspecified site: Secondary | ICD-10-CM

## 2021-01-22 MED ORDER — CEFDINIR 300 MG PO CAPS: 600.0000 mg | ORAL_CAPSULE | Freq: Every day | ORAL | 0 refills | Status: DC

## 2021-01-22 NOTE — Telephone Encounter (Signed)
Summary: UTI not better   Pt on abx for UTI.  Pt finished her abx, but still having chills, frequency, burning w/ urination.  All same sx.  He feels like he needs another rund od abx. Pt sent mychart message, and waiting on answer    Attempted to call patient but no answer. Left message for patient to return call.  Spoke with FC to verify that Dr.Fisher was still in the office today. Patient also sent MyChart message that was routed to Dr. Caryn Section on today.

## 2021-01-22 NOTE — Telephone Encounter (Signed)
Chief Complaint: UTI symptoms after antibiotics Symptoms: Frequency Frequency: Symptoms of frequency never went away; pain in back started 1-2 days after antibiotic completed on Monday 01/18/21 Pertinent Negatives: Patient denies other symptoms, no blood in urine Disposition: [] ED /[] Urgent Care (no appt availability in office) / [x] Appointment(In office/virtual)/ []  [facility]/ [] Home Care/ [] Refused Recommended Disposition /[] [facility]/ [x]  Follow-up with PCP Additional Notes: Husband advised someone will call back with Dr. Maralyn Sago recommendation since antibiotics didn't clear up the UTI.   Reason for Disposition  [1] Taking antibiotic > 72 hours (3 days) for UTI AND [2] painful urination or frequency not improved  Answer Assessment - Initial Assessment Questions 1. ANTIBIOTIC: "What antibiotic are you taking?" "How many times per day?"     Ceftinir 2 caps daily, completed it on Monday 2. DURATION: "When was the antibiotic started?"     10 days prior to Monday 3. MAIN SYMPTOM: "What is the main symptom you are concerned about?"     Frequency never went away 4. FEVER: "Do you have a fever?" If Yes, ask: "What is it, how was it measured, and when did it start?"     No 5. OTHER SYMPTOMS: "Do you have any other symptoms?" (e.g., flank pain, vaginal discharge, blood in urine)     Pain in the left side and back like before in the last day or so after completing the antibiotic  Protocols used: Urinary Tract Infection on Antibiotic Follow-up Call - [facility]

## 2021-01-27 ENCOUNTER — Telehealth: Payer: Self-pay | Admitting: Family Medicine

## 2021-01-27 ENCOUNTER — Telehealth (INDEPENDENT_AMBULATORY_CARE_PROVIDER_SITE_OTHER): Payer: Medicare HMO | Admitting: Family Medicine

## 2021-01-27 DIAGNOSIS — J069 Acute upper respiratory infection, unspecified: Secondary | ICD-10-CM | POA: Diagnosis not present

## 2021-01-27 DIAGNOSIS — N390 Urinary tract infection, site not specified: Secondary | ICD-10-CM | POA: Diagnosis not present

## 2021-01-27 MED ORDER — LEVOTHYROXINE SODIUM 75 MCG PO TABS: ORAL_TABLET | ORAL | 0 refills | Status: DC

## 2021-01-27 NOTE — Progress Notes (Signed)
MyChart Video Visit    Virtual Visit via Video Note   This visit type was conducted due to national recommendations for restrictions regarding the COVID-19 Pandemic (e.g. social distancing) in an effort to limit this patient's exposure and mitigate transmission in our community. This patient is at least at moderate risk for complications without adequate follow up. This format is felt to be most appropriate for this patient at this time. Physical exam was limited by quality of the video and audio technology used for the visit.   Patient location: home Provider location: [facility]  I discussed the limitations of evaluation and management by telemedicine and the availability of in person appointments. The patient expressed understanding and agreed to proceed.  Patient: Sharon Bryan   DOB: [birth year]   56 y.o. Female  MRN: [id number] Visit Date: 01/27/2021  Today's healthcare provider: Lelon Huh, MD   Chief Complaint  Patient presents with   URI   Subjective    URI  This is a new problem. The current episode started in the past 7 days. The problem has been gradually worsening. The maximum temperature recorded prior to her arrival was 100.4 - 100.9 F. The fever has been present for 1 to 2 days. Associated symptoms include congestion, rhinorrhea and sinus pain. Pertinent negatives include no coughing, ear pain, headaches, sneezing, sore throat or wheezing.   Had negative Covid test x 2.  She was prescribed cefdinir last week for UTI symptoms which are doing much better.   Medications: Outpatient Medications Prior to Visit  Medication Sig   ALPRAZolam (XANAX) 1 MG tablet TAKE 1/2 TO 1 TABLET BY MOUTH EVERY 8 HOURS AS NEEDED   amitriptyline (ELAVIL) 25 MG tablet TAKE 2 TO 3 TABLETS BY MOUTH AT BEDTIME   Aspirin-Acetaminophen-Caffeine (EXCEDRIN PO) Take 1 tablet daily as needed by mouth.   buPROPion (WELLBUTRIN XL) 150 MG 24 hr tablet TAKE 1 TABLET(150 MG) BY MOUTH EVERY MORNING    busPIRone (BUSPAR) 30 MG tablet TAKE 1 TABLET BY MOUTH TWICE DAILY   cefdinir (OMNICEF) 300 MG capsule Take 2 capsules (600 mg total) by mouth daily for 10 days.   estradiol (ESTRACE) 2 MG tablet TAKE 1 TABLET(2 MG) BY MOUTH DAILY   lamoTRIgine (LAMICTAL) 100 MG tablet TAKE 1 TABLET BY MOUTH TWICE DAILY   levothyroxine (SYNTHROID) 75 MCG tablet TAKE 1 TABLET(75 MCG) BY MOUTH DAILY   LYRICA 225 MG capsule TAKE 1 CAPSULE(225 MG) BY MOUTH TWICE DAILY   Magnesium Oxide 420 MG TABS Take 400 mg by mouth daily.   meloxicam (MOBIC) 15 MG tablet TAKE 1 TABLET(15 MG) BY MOUTH DAILY AS NEEDED FOR PAIN   Multiple Vitamins-Minerals (CENTRUM SILVER PO) Take by mouth.   neomycin-polymyxin-hydrocortisone (CORTISPORIN) 3.5-10000-1 OTIC suspension SHAKE LIQUID AND INSTILL 3 TO 4 DROPS TO AFFECTED EAR FOUR TIMES DAILY   omeprazole (PRILOSEC) 20 MG capsule TAKE 1 CAPSULE BY MOUTH EVERY DAY   PARoxetine (PAXIL) 30 MG tablet TAKE 2 TABLETS(60 MG) BY MOUTH DAILY   phenytoin (DILANTIN) 100 MG ER capsule Take 2 capsules (200 mg total) by mouth 2 (two) times daily.   propranolol (INDERAL) 10 MG tablet Take 1 tablet (10 mg total) by mouth at bedtime.   fluticasone (FLONASE) 50 MCG/ACT nasal spray Place 2 sprays into both nostrils daily.   ondansetron (ZOFRAN) 4 MG tablet Take 1 tablet (4 mg total) every 8 (eight) hours as needed by mouth for nausea or vomiting.   No facility-administered medications prior to visit.  Review of Systems  Constitutional:  Positive for chills, fatigue and fever. Negative for activity change, appetite change, diaphoresis and unexpected weight change.  HENT:  Positive for congestion, postnasal drip, rhinorrhea, sinus pressure and sinus pain. Negative for ear discharge, ear pain, sneezing and sore throat.   Eyes:  Positive for discharge and itching. Negative for photophobia, pain, redness and visual disturbance.  Respiratory: Negative.  Negative for apnea, cough, choking, chest tightness,  shortness of breath, wheezing and stridor.   Musculoskeletal:  Positive for myalgias.  Neurological:  Negative for dizziness, light-headedness and headaches.     Objective    LMP 04/26/2002 (LMP Unknown)    Physical Exam   Awake, alert, oriented x 3. In no apparent distress   Assessment & Plan     1. Urinary tract infection without hematuria, site unspecified Doing much better since starting cefdinir last week. Is to finish and call if sx return.   2. Upper respiratory tract infection, unspecified type Likely viral.  Call if not improving in 5-10 days      I discussed the assessment and treatment plan with the patient. The patient was provided an opportunity to ask questions and all were answered. The patient agreed with the plan and demonstrated an understanding of the instructions.   The patient was advised to call back or seek an in-person evaluation if the symptoms worsen or if the condition fails to improve as anticipated.  I provided 8 minutes of non-face-to-face time during this encounter.  The entirety of the information documented in the History of Present Illness, Review of Systems and Physical Exam were personally obtained by me. Portions of this information were initially documented by the CMA and reviewed by me for thoroughness and accuracy.    Lelon Huh, MD [facility] [phone] (phone) [phone] (fax)  [place]

## 2021-01-27 NOTE — Telephone Encounter (Signed)
[facility] faxed refill request for the following medications:  levothyroxine (SYNTHROID) 75 MCG tablet   Please advise.

## 2021-01-27 NOTE — Telephone Encounter (Signed)
Patient due for follow up and labs. Tried calling patient. Left message to call back. OK for [facility] triage to advise. She has an appointment with Dr. Caryn Section today at 10:40am for and acute visit. Will try to address this message/ refill request at that time.

## 2021-02-01 ENCOUNTER — Ambulatory Visit (INDEPENDENT_AMBULATORY_CARE_PROVIDER_SITE_OTHER): Payer: Medicare HMO | Admitting: Psychology

## 2021-02-01 DIAGNOSIS — F3289 Other specified depressive episodes: Secondary | ICD-10-CM

## 2021-02-01 NOTE — Progress Notes (Signed)
[place] Counselor/Therapist Progress Note  Patient ID: Sharon Bryan, MRN: [id number]    Date: 02/01/21  Time Spent: 1:34 pm - 2:01 pm : 27 Minutes  Treatment Type: Individual Therapy.  Reported Symptoms: Sadness, anxiety, rumination.    Mental Status Exam:  Appearance:  Neat and Well Groomed     Behavior: Appropriate  Motor: Normal  Speech/Language:  Clear and Coherent and Normal Rate  Affect: Depressed  Mood: depressed  Thought process: normal  Thought content:   WNL  Sensory/Perceptual disturbances:   WNL  Orientation: oriented to person, place, time/date, and situation  Attention: Good  Concentration: Good  Memory: WNL  Fund of knowledge:  Good  Insight:   Good  Judgment:  Good  Impulse Control: Good   Risk Assessment: Danger to Self:  No Self-injurious Behavior: No Danger to Others: No Duty to Warn:no Physical Aggression / Violence:No  Access to Firearms a concern: Yes  Gang Involvement:No   Subjective:   [name] participated from home, via video, and consented to treatment. Therapist participated from office. We met online due to [place] pandemic. Cheynne reviewed the events of the past week. [name] noted her attempts to communicate the need for couples counseling, with her husband, and noted a significant and negative reaction, highlighting "tantrum" behavior. We explored this during the session and her attempts to communicate concisely and directly. We processed her feelings regarding the experience and her intent to engage in this topic, again, soon into the future.  Therapist praised Keria for her efforts to address issues in her marriage productively and positively.  We worked on identifying ways empathize with her husband's reaction and to address tension regarding the lack of immediate resolution.  Matilde was engaged and motivated during the session and expressed commitment towards her goals.  Therapist praised  Deserai for her effort and energy during session between sessions and provided supportive therapy.    Therapist provided supportive therapy.   Interventions: Interpersonal  Diagnosis: Other depression   Treatment Plan:  Client Abilities/Strengths Athea is forthcoming and motivated for change.   Client Treatment Preferences Outpatient Therapy.   Client Statement of Needs Zuria discussed her goals for treatment including engagement in previously enjoyable activities, managing her overall mood and symptoms, improving her sleep, reduce rumination, improving self-talk, and process past events (including traumatic events).   Treatment Level Weekly  Symptoms Depression: Loss of interest, feeling depressed, lethargy, overeating, feeling bad about self, trouble concentrating, denied SI (hx w/o hospitalization). (Status: maintained)  Anxiety: Feeling anxious, difficulty managing worrying, worry about different things, difficulty relaxing, restlessness, irritability, feeling bad something awful might happen. (Status: maintained)  Goals:   Ayala experiences symptoms of anxiety with depressive symptoms.    Target Date: 09/17/21 Frequency: Weekly  Progress: 0 Modality: individual    Therapist will provide referrals for additional resources as appropriate.  Therapist will provide psycho-education regarding Umeka's diagnosis and corresponding treatment approaches and interventions. Hurley will employ CBT, BA, Problem-solving, Solution Focused, Mindfulness, and coping skills to help manage decrease symptoms associated with her diagnosis.   Reduce overall level, frequency, and intensity of the feelings of depression & anxiety as evidenced by a decrease in symptoms of depression and anxiety  from 6 to 7 days/week to 0 to 1 days/week per client report for at least 3 consecutive months. Verbally express understanding of the relationship between feelings of depression, anxiety and  their impact on thinking patterns and behaviors. Verbalize an understanding of the role that distorted thinking  plays in creating fears, excessive worry, and ruminations.  [name] participated in the creation of the treatment plan)  [name], LCSW

## 2021-02-08 ENCOUNTER — Ambulatory Visit (INDEPENDENT_AMBULATORY_CARE_PROVIDER_SITE_OTHER): Payer: Medicare HMO | Admitting: Psychology

## 2021-02-08 DIAGNOSIS — F3289 Other specified depressive episodes: Secondary | ICD-10-CM

## 2021-02-08 NOTE — Progress Notes (Signed)
[place] Counselor/Therapist Progress Note  Patient ID: ZARAH CARBON, MRN: [id number]    Date: 02/08/21  Time Spent: 3:38 pm - 4:25 pm : 47Minutes  Treatment Type: Individual Therapy.  Reported Symptoms: Sadness, anxiety, rumination.    Mental Status Exam:  Appearance:  Neat and Well Groomed     Behavior: Appropriate  Motor: Normal  Speech/Language:  Clear and Coherent and Normal Rate  Affect: Congruent  Mood: normal  Thought process: normal  Thought content:   WNL  Sensory/Perceptual disturbances:   WNL  Orientation: oriented to person, place, time/date, and situation  Attention: Good  Concentration: Good  Memory: [place] of knowledge:  Good  Insight:   Good  Judgment:  Good  Impulse Control: Good   Risk Assessment: Danger to Self:  No Self-injurious Behavior: No Danger to Others: No Duty to Warn:no Physical Aggression / Violence:No  Access to Firearms a concern: Yes  Gang Involvement:No   Subjective:   Sharon Bryan participated from home, via video, and consented to treatment. Therapist participated from home office. We met online due to [place] pandemic. Sharon Bryan reviewed the events of the past week. Sharon Bryan noted her dog's recent cancer diagnosis and his expected passing. She reflect on her grief and the grief for her other pet, Sharon Bryan, many years ago.  Therapist provided psycho-education regarding loss and grief. Sharon Bryan reflected on her past coping regarding loss. We worked identifying positive coping skills, to employ, going forward including consistent self-care and engagement with others. Sharon Bryan was engaged and motivated during the session and expressed commitment towards her goals.  Therapist praised Sharon Bryan for her effort and energy during session between sessions and provided supportive therapy.   Interventions: Grief Therapy  Diagnosis: Other depression   Treatment Plan:  Client Abilities/Strengths Sharon Bryan is  forthcoming and motivated for change.   Client Treatment Preferences Outpatient Therapy.   Client Statement of Needs Sharon Bryan discussed her goals for treatment including engagement in previously enjoyable activities, managing her overall mood and symptoms, improving her sleep, reduce rumination, improving self-talk, and process past events (including traumatic events).   Treatment Level Weekly  Symptoms Depression: Loss of interest, feeling depressed, lethargy, overeating, feeling bad about self, trouble concentrating, denied SI (hx w/o hospitalization). (Status: maintained)  Anxiety: Feeling anxious, difficulty managing worrying, worry about different things, difficulty relaxing, restlessness, irritability, feeling bad something awful might happen. (Status: maintained)  Goals:   Sharon Bryan experiences symptoms of anxiety with depressive symptoms.    Target Date: 09/17/21 Frequency: Weekly  Progress: 0 Modality: individual    Therapist will provide referrals for additional resources as appropriate.  Therapist will provide psycho-education regarding Sharon Bryan's diagnosis and corresponding treatment approaches and interventions. Eliyana will employ CBT, BA, Problem-solving, Solution Focused, Mindfulness, and coping skills to help manage decrease symptoms associated with her diagnosis.   Reduce overall level, frequency, and intensity of the feelings of depression & anxiety as evidenced by a decrease in symptoms of depression and anxiety  from 6 to 7 days/week to 0 to 1 days/week per client report for at least 3 consecutive months. Verbally express understanding of the relationship between feelings of depression, anxiety and their impact on thinking patterns and behaviors. Verbalize an understanding of the role that distorted thinking plays in creating fears, excessive worry, and ruminations.  Sharon Bryan participated in the creation of the treatment plan)    [name], LCSW

## 2021-02-09 LAB — T4, FREE: Free T4: 1.04 ng/dL (ref 0.82–1.77)

## 2021-02-09 LAB — TSH: TSH: 4.02 u[IU]/mL (ref 0.450–4.500)

## 2021-02-10 ENCOUNTER — Ambulatory Visit: Payer: Self-pay

## 2021-02-10 NOTE — Telephone Encounter (Signed)
2nd call to patient to review sx of left side pain and UTI. No answer, LVMTCB [phone].

## 2021-02-10 NOTE — Telephone Encounter (Signed)
3 rd attempt to contact patient regarding sx left side pain / UTI. Left message to call clinic back [phone].

## 2021-02-10 NOTE — Telephone Encounter (Signed)
Summary: clinical advice   Pt stating that her Left side is hurting had a uti and wants to get clinical advice       Left message to call back about symptoms.

## 2021-02-12 ENCOUNTER — Other Ambulatory Visit: Payer: Self-pay | Admitting: Family Medicine

## 2021-02-12 DIAGNOSIS — G4700 Insomnia, unspecified: Secondary | ICD-10-CM

## 2021-02-12 MED ORDER — AMITRIPTYLINE HCL 25 MG PO TABS: 50.0000 mg | ORAL_TABLET | Freq: Every day | ORAL | 1 refills | Status: DC

## 2021-02-12 NOTE — Addendum Note (Signed)
Addended by: Ashley Royalty E on: 02/12/2021 11:39 AM   Modules accepted: Orders

## 2021-02-12 NOTE — Telephone Encounter (Signed)
Requested medication (s) are due for refill today: yes  Requested medication (s) are on the active medication list: yes  Last refill:  12/12 for #90  Future visit scheduled: yes, 02/22/21  Notes to clinic:  This medication can not be delegated, please assess.   Requested Prescriptions  Pending Prescriptions Disp Refills   ALPRAZolam (XANAX) 1 MG tablet [Pharmacy Med Name: ALPRAZOLAM 1MG  TABLETS] 90 tablet     Sig: TAKE 1/2 TO 1 TABLET BY MOUTH EVERY 8 HOURS AS NEEDED     Not Delegated - Psychiatry:  Anxiolytics/Hypnotics Failed - 02/12/2021 12:59 AM      Failed - This refill cannot be delegated      Failed - Urine Drug Screen completed in last 360 days      Passed - Valid encounter within last 6 months    Recent Outpatient Visits           2 weeks ago Urinary tract infection without hematuria, site unspecified   [facility] Birdie Sons, MD   1 month ago [place], Donald E, MD   8 months ago Generalized anxiety disorder   [facility] Birdie Sons, MD   11 months ago Insomnia, unspecified type   [facility] Birdie Sons, MD   1 year ago Acute cystitis without hematuria   [facility] Birdie Sons, MD       Future Appointments             In 1 week Fisher, Kirstie Peri, MD [facility], [place]

## 2021-02-12 NOTE — Telephone Encounter (Signed)
[facility] faxed refill request for the following medications:  amitriptyline (ELAVIL) 25 MG tablet   Please advise.   [facility]  [address]  [place] [phone]

## 2021-02-12 NOTE — Telephone Encounter (Signed)
LOV: 01/08/2021 (Video visit)  NOV: 02/22/2021  Last Refill: 09/28/2020 #90 3 Refills.    Thanks,   -Mickel Baas

## 2021-02-16 ENCOUNTER — Encounter: Payer: Self-pay | Admitting: Family Medicine

## 2021-02-16 DIAGNOSIS — M791 Myalgia, unspecified site: Secondary | ICD-10-CM

## 2021-02-17 MED ORDER — CEFDINIR 300 MG PO CAPS: 600.0000 mg | ORAL_CAPSULE | Freq: Every day | ORAL | 0 refills | Status: DC

## 2021-02-22 ENCOUNTER — Other Ambulatory Visit: Payer: Self-pay

## 2021-02-22 ENCOUNTER — Ambulatory Visit (INDEPENDENT_AMBULATORY_CARE_PROVIDER_SITE_OTHER): Payer: Medicare HMO | Admitting: Family Medicine

## 2021-02-22 ENCOUNTER — Encounter: Payer: Self-pay | Admitting: Family Medicine

## 2021-02-22 VITALS — BP 111/71 | HR 80 | Temp 98.4°F | Resp 18 | Ht 62.0 in | Wt 270.0 lb

## 2021-02-22 DIAGNOSIS — N390 Urinary tract infection, site not specified: Secondary | ICD-10-CM

## 2021-02-22 LAB — POCT URINALYSIS DIPSTICK
Bilirubin, UA: POSITIVE
Blood, UA: NEGATIVE
Glucose, UA: NEGATIVE
Nitrite, UA: NEGATIVE
Protein, UA: POSITIVE — AB
Spec Grav, UA: 1.02 (ref 1.010–1.025)
Urobilinogen, UA: 0.2 E.U./dL
pH, UA: 6.5 (ref 5.0–8.0)

## 2021-02-22 NOTE — Patient Instructions (Addendum)
Please review the attached list of medications and notify my office if there are any errors.   Please call the [facility] [phone]) to schedule a routine screening mammogram.   I recommend Dr. Marcelline Mates or Dr. Amalia Hailey at [facility] for routine physical/pap and pelvic exam. You can call [phone] to make an appointment

## 2021-02-22 NOTE — Progress Notes (Signed)
Established patient visit  I,Sharon Bryan,acting as a scribe for Lelon Huh, MD.,have documented all relevant documentation on the behalf of Lelon Huh, MD,as directed by  Lelon Huh, MD while in the presence of Lelon Huh, MD.   Patient: Sharon Bryan   DOB: 03/13/1965   56 y.o. Female  MRN: [id number] Visit Date: 02/22/2021  Today's healthcare provider: Lelon Huh, MD   Chief Complaint  Patient presents with   Follow-up   Hypothyroidism   Subjective    HPI  Hypothyroid, follow-up  Lab Results  Component Value Date   TSH 4.020 02/08/2021   TSH 4.830 (H) 09/03/2020   TSH 6.910 (H) 02/25/2020   FREET4 1.04 02/08/2021   FREET4 0.92 09/03/2020   T4TOTAL 9.6 02/04/2016   T4TOTAL 8.1 08/07/2014    Wt Readings from Last 3 Encounters:  02/22/21 270 lb (122.5 kg)  11/04/19 273 lb (123.8 kg)  06/11/19 280 lb (127 kg)    She was last seen for hypothyroid 11/04/2019.  Management since that visit includes labs checked on 02/08/2021 showing-Your thyroid functions are all in the normal range now. She reports good compliance with treatment. She is not having side effects. none  -----------------------------------------------------------------------------------------   She also has had several UTIs over the last few months, although these have been treated virtually based on symptoms. She was last prescribed a 10 day course of prednisone on 02/17/2021 and states symptoms have improved only slightly since then. She states that symptoms typically resolve with antibiotic, but quickly return when she finishes. She reports abundant water intake and feels she is keeping herself well hydrated.   Medications: Outpatient Medications Prior to Visit  Medication Sig   ALPRAZolam (XANAX) 1 MG tablet TAKE 1/2 TO 1 TABLET BY MOUTH EVERY 8 HOURS AS NEEDED   amitriptyline (ELAVIL) 25 MG tablet Take 2-3 tablets (50-75 mg total) by mouth at bedtime.    Aspirin-Acetaminophen-Caffeine (EXCEDRIN PO) Take 1 tablet daily as needed by mouth.   buPROPion (WELLBUTRIN XL) 150 MG 24 hr tablet TAKE 1 TABLET(150 MG) BY MOUTH EVERY MORNING   busPIRone (BUSPAR) 30 MG tablet TAKE 1 TABLET BY MOUTH TWICE DAILY   cefdinir (OMNICEF) 300 MG capsule Take 2 capsules (600 mg total) by mouth daily for 10 days.   estradiol (ESTRACE) 2 MG tablet TAKE 1 TABLET(2 MG) BY MOUTH DAILY   lamoTRIgine (LAMICTAL) 100 MG tablet TAKE 1 TABLET BY MOUTH TWICE DAILY   levothyroxine (SYNTHROID) 75 MCG tablet TAKE 1 TABLET(75 MCG) BY MOUTH DAILY   LYRICA 225 MG capsule TAKE 1 CAPSULE(225 MG) BY MOUTH TWICE DAILY   Magnesium Oxide 420 MG TABS Take 400 mg by mouth daily.   meloxicam (MOBIC) 15 MG tablet TAKE 1 TABLET(15 MG) BY MOUTH DAILY AS NEEDED FOR PAIN   Multiple Vitamins-Minerals (CENTRUM SILVER PO) Take by mouth.   neomycin-polymyxin-hydrocortisone (CORTISPORIN) 3.5-10000-1 OTIC suspension SHAKE LIQUID AND INSTILL 3 TO 4 DROPS TO AFFECTED EAR FOUR TIMES DAILY   omeprazole (PRILOSEC) 20 MG capsule TAKE 1 CAPSULE BY MOUTH EVERY DAY   PARoxetine (PAXIL) 30 MG tablet TAKE 2 TABLETS(60 MG) BY MOUTH DAILY   phenytoin (DILANTIN) 100 MG ER capsule Take 2 capsules (200 mg total) by mouth 2 (two) times daily.   propranolol (INDERAL) 10 MG tablet Take 1 tablet (10 mg total) by mouth at bedtime.   [DISCONTINUED] fluticasone (FLONASE) 50 MCG/ACT nasal spray Place 2 sprays into both nostrils daily.   [DISCONTINUED] ondansetron (ZOFRAN) 4 MG tablet Take  1 tablet (4 mg total) every 8 (eight) hours as needed by mouth for nausea or vomiting.   No facility-administered medications prior to visit.    Review of Systems  Constitutional:  Negative for appetite change, chills, fatigue and fever.  Respiratory:  Negative for chest tightness and shortness of breath.   Cardiovascular:  Negative for chest pain and palpitations.  Gastrointestinal:  Negative for abdominal pain, nausea and vomiting.   Neurological:  Negative for dizziness and weakness.      Objective    BP 111/71 (BP Location: Left Arm, Patient Position: Sitting, Cuff Size: Large)    Pulse 80    Temp 98.4 F (36.9 C) (Temporal)    Resp 18    Ht 5\' 2"  (1.575 m)    Wt 270 lb (122.5 kg)    LMP 04/26/2002 (LMP Unknown)    SpO2 92%    BMI 49.38 kg/m  {Show previous vital signs (optional):23777}   General appearance: Obese female, cooperative and in no acute distress Head: Normocephalic, without obvious abnormality, atraumatic Respiratory: Respirations even and unlabored, normal respiratory rate Extremities: All extremities are intact.  Skin: Skin color, texture, turgor normal. No rashes seen  Psych: Appropriate mood and affect. Neurologic: Mental status: Alert, oriented to person, place, and time, thought content appropriate.   Results for orders placed or performed in visit on 02/22/21  POCT urinalysis dipstick  Result Value Ref Range   Color, UA Dark Yellow    Clarity, UA Cloudy    Glucose, UA Negative Negative   Bilirubin, UA Positive    Ketones, UA Small    Spec Grav, UA 1.020 1.010 - 1.025   Blood, UA Negative    pH, UA 6.5 5.0 - 8.0   Protein, UA Positive (A) Negative   Urobilinogen, UA 0.2 0.2 or 1.0 E.U./dL   Nitrite, UA Negative    Leukocytes, UA Small (1+) (A) Negative    Assessment & Plan     1. Urinary tract infection without hematuria, site unspecified Recurrent and only temporarily improve with antibiotics. Currently on cefdinir.  - CULTURE, URINE COMPREHENSIVE  If culture is negative and sx persistent will need to repeat culture off of antibiotic.   2. Recurrent UTI  - US Renal; Future   Is on oral estrogen . Discussed trial of topical estrogen, although she is primary taking estrogen for hot flashes which topical cream would likely not help.   Counseled that she is overdue for routine CPE/mammogram. She has had partial hysterectomy years ago, but she doesn't now if cervix remains. She  was given contact information for [facility] and [facility].   She also reports hair falling out and asks about progesterone cream. Her thyroid tests were recently normal. This is likely age related, but I'm not familiar with use of progesterone creams for hair loss and recommended seeing dermatology if further evaluation and treatment is wanted.      The entirety of the information documented in the History of Present Illness, Review of Systems and Physical Exam were personally obtained by me. Portions of this information were initially documented by the CMA and reviewed by me for thoroughness and accuracy. Lelon Huh, MD  [facility] [phone] (phone) [phone] (fax)  [place]

## 2021-02-23 ENCOUNTER — Other Ambulatory Visit: Payer: Self-pay | Admitting: Family Medicine

## 2021-02-23 NOTE — Telephone Encounter (Signed)
Requested medication (s) are due for refill today:   Yes for both  Requested medication (s) are on the active medication list:   Yes for both  Future visit scheduled:   No   Seen yesterday 1/30 by Dr. Caryn Section   Last ordered: Mobic 11/25/2020 #90, 0 refills;   Synthroid 01/27/2021 #30, 0 refills  Returned because wasn't sure if Dr. Caryn Section wanted these refilled or not.   She did not make an appt for her CPE while she was there yesterday and I don't know if it was addressed during her OV yesterday.   Dr. Caryn Section to review.   Requested Prescriptions  Pending Prescriptions Disp Refills   meloxicam (MOBIC) 15 MG tablet [Pharmacy Med Name: MELOXICAM 15MG  TABLETS] 90 tablet 0    Sig: TAKE 1 TABLET(15 MG) BY MOUTH DAILY AS NEEDED FOR PAIN     Analgesics:  COX2 Inhibitors Failed - 02/23/2021  7:05 AM      Failed - HGB in normal range and within 360 days    Hemoglobin  Date Value Ref Range Status  02/25/2020 12.7 11.1 - 15.9 g/dL Final          Failed - Cr in normal range and within 360 days    Creatinine  Date Value Ref Range Status  09/18/2012 0.87 0.60 - 1.30 mg/dL Final   Creatinine, Ser  Date Value Ref Range Status  02/25/2020 0.67 0.57 - 1.00 mg/dL Final          Passed - Patient is not pregnant      Passed - Valid encounter within last 12 months    Recent Outpatient Visits           Yesterday Urinary tract infection without hematuria, site unspecified   [facility] Birdie Sons, MD   3 weeks ago Urinary tract infection without hematuria, site unspecified   [facility] Birdie Sons, MD   1 month ago [place], Donald E, MD   8 months ago Generalized anxiety disorder   [facility] Birdie Sons, MD   11 months ago Insomnia, unspecified type   [facility] Birdie Sons, MD               levothyroxine (SYNTHROID) 75 MCG tablet [Pharmacy Med Name: LEVOTHYROXINE  0.075MG  (75MCG) TABS] 90 tablet     Sig: TAKE 1 TABLET(75 MCG) BY MOUTH DAILY     Endocrinology:  Hypothyroid Agents Failed - 02/23/2021  7:05 AM      Failed - TSH needs to be rechecked within 3 months after an abnormal result. Refill until TSH is due.      Passed - TSH in normal range and within 360 days    TSH  Date Value Ref Range Status  02/08/2021 4.020 0.450 - 4.500 uIU/mL Final          Passed - Valid encounter within last 12 months    Recent Outpatient Visits           Yesterday Urinary tract infection without hematuria, site unspecified   [facility] Birdie Sons, MD   3 weeks ago Urinary tract infection without hematuria, site unspecified   [facility] Birdie Sons, MD   1 month ago [place], Donald E, MD   8 months ago Generalized anxiety disorder   [place], [place]   11 months ago Insomnia,  unspecified type   [facility] Caryn Section, Kirstie Peri, MD

## 2021-02-24 ENCOUNTER — Ambulatory Visit (INDEPENDENT_AMBULATORY_CARE_PROVIDER_SITE_OTHER): Payer: Medicare HMO | Admitting: Psychology

## 2021-02-24 DIAGNOSIS — F3289 Other specified depressive episodes: Secondary | ICD-10-CM | POA: Diagnosis not present

## 2021-02-24 NOTE — Progress Notes (Signed)
[place] Counselor/Therapist Progress Note  Patient ID: Sharon Bryan, MRN: [id number]    Date: 02/24/21  Time Spent: 1:39 pm - 2:25 pm : 46 Minutes  Treatment Type: Individual Therapy.  Reported Symptoms: Sadness, anxiety, rumination.    Mental Status Exam:  Appearance:  Neat and Well Groomed     Behavior: Appropriate  Motor: Normal  Speech/Language:  Clear and Coherent and Normal Rate  Affect: Congruent  Mood: normal  Thought process: normal  Thought content:   WNL  Sensory/Perceptual disturbances:   WNL  Orientation: oriented to person, place, time/date, and situation  Attention: Good  Concentration: Good  Memory: [place] of knowledge:  Good  Insight:   Good  Judgment:  Good  Impulse Control: Good   Risk Assessment: Danger to Self:  No Self-injurious Behavior: No Danger to Others: No Duty to Warn:no Physical Aggression / Violence:No  Access to Firearms a concern: Yes  Gang Involvement:No   Subjective:   Sharon Bryan participated from home, via video, and consented to treatment. Therapist participated from home office. We met online due to [place] pandemic. Sharon Bryan reviewed the events of the past week. Sharon Bryan  noted being ill again and the effect on her overall mood, as a result. Sharon Bryan noted feeling sadness and discussed the lack of consistent support, from her husband, during her numerous illnesses.  We explored this during the session and processed her feelings.  We worked on feeling identification during session.  Sharon Bryan discussed frustration and issues with decisional balance going forward.  We explored this as well.  Therapist encouraged Sharon Bryan to identify viable options going forward, processed through the pros and cons of each, and for [place] to discuss this during our follow-up.  Therapist reviewed assertive and clear/positive communication.  Therapist validated and normalized Sharon Bryan feelings of frustration and irritation and  sadness.  Therapist encouraged self-care and provided supportive therapy.  Interventions: Interpersonal  Diagnosis: Other depression   Treatment Plan:  Client Abilities/Strengths Sharon Bryan is forthcoming and motivated for change.   Client Treatment Preferences Outpatient Therapy.   Client Statement of Needs Sharon Bryan discussed her goals for treatment including engagement in previously enjoyable activities, managing her overall mood and symptoms, improving her sleep, reduce rumination, improving self-talk, and process past events (including traumatic events).   Treatment Level Weekly  Symptoms Depression: Loss of interest, feeling depressed, lethargy, overeating, feeling bad about self, trouble concentrating, denied SI (hx w/o hospitalization). (Status: maintained)  Anxiety: Feeling anxious, difficulty managing worrying, worry about different things, difficulty relaxing, restlessness, irritability, feeling bad something awful might happen. (Status: maintained)  Goals:   Saysha experiences symptoms of anxiety with depressive symptoms.    Target Date: 09/17/21 Frequency: Weekly  Progress: 0 Modality: individual    Therapist will provide referrals for additional resources as appropriate.  Therapist will provide psycho-education regarding Sharon Bryan's diagnosis and corresponding treatment approaches and interventions. Sharon Bryan will employ CBT, BA, Problem-solving, Solution Focused, Mindfulness, and coping skills to help manage decrease symptoms associated with her diagnosis.   Reduce overall level, frequency, and intensity of the feelings of depression & anxiety as evidenced by a decrease in symptoms of depression and anxiety  from 6 to 7 days/week to 0 to 1 days/week per client report for at least 3 consecutive months. Verbally express understanding of the relationship between feelings of depression, anxiety and their impact on thinking patterns and behaviors. Verbalize an  understanding of the role that distorted thinking plays in creating fears, excessive worry, and ruminations.  (  Sharon Bryan participated in the creation of the treatment plan)    [name], LCSW

## 2021-02-25 ENCOUNTER — Encounter: Payer: Self-pay | Admitting: Family Medicine

## 2021-02-25 LAB — CULTURE, URINE COMPREHENSIVE

## 2021-03-01 ENCOUNTER — Other Ambulatory Visit: Payer: Self-pay | Admitting: Family Medicine

## 2021-03-01 DIAGNOSIS — M791 Myalgia, unspecified site: Secondary | ICD-10-CM

## 2021-03-01 MED ORDER — CEFDINIR 300 MG PO CAPS: 600.0000 mg | ORAL_CAPSULE | Freq: Every day | ORAL | 0 refills | Status: AC

## 2021-03-01 NOTE — Progress Notes (Signed)
Since your still having symptoms, i'd like you to stay on the antibiotic one more week. I sent a refill to [facility] in [place].

## 2021-03-02 ENCOUNTER — Telehealth: Payer: Self-pay

## 2021-03-02 ENCOUNTER — Encounter: Payer: Self-pay | Admitting: Family Medicine

## 2021-03-02 NOTE — Telephone Encounter (Signed)
Copied from [place] [id number]. Topic: Quick Communication - Rx Refill/Question >> Mar 02, 2021  3:20 PM Pawlus, Brayton Layman A wrote: Pt was calling to follow up on a mychart message regarding needing a PA for her medication, pt requested a call back.

## 2021-03-02 NOTE — Telephone Encounter (Signed)
I tried completing PA via covermy meds, but was unable to receive clinical questions. I called insurance and completed prior auth over the phone. Awaiting response from insurance. Patient advised.

## 2021-03-03 ENCOUNTER — Ambulatory Visit (INDEPENDENT_AMBULATORY_CARE_PROVIDER_SITE_OTHER): Payer: Medicare HMO | Admitting: Psychology

## 2021-03-03 ENCOUNTER — Other Ambulatory Visit: Payer: Self-pay | Admitting: Family Medicine

## 2021-03-03 DIAGNOSIS — F3289 Other specified depressive episodes: Secondary | ICD-10-CM | POA: Diagnosis not present

## 2021-03-03 NOTE — Telephone Encounter (Signed)
[place] faxed refill request for the following medications:  LYRICA 225 MG capsule   Please advise.

## 2021-03-03 NOTE — Telephone Encounter (Signed)
Patient's husband advised that PA has been approved.

## 2021-03-03 NOTE — Progress Notes (Signed)
[place] Counselor/Therapist Progress Note  Patient ID: Sharon Bryan, MRN: [id number]    Date: 03/03/21  Time Spent: 12:33 pm - 1:27 pm : 54 Minutes  Treatment Type: Individual Therapy.  Reported Symptoms: Sadness, anxiety, rumination.    Mental Status Exam:  Appearance:  Neat and Well Groomed     Behavior: Appropriate  Motor: Normal  Speech/Language:  Clear and Coherent and Normal Rate  Affect: Congruent  Mood: normal  Thought process: normal  Thought content:   WNL  Sensory/Perceptual disturbances:   WNL  Orientation: oriented to person, place, time/date, and situation  Attention: Good  Concentration: Good  Memory: [place] of knowledge:  Good  Insight:   Good  Judgment:  Good  Impulse Control: Good   Risk Assessment: Danger to Self:  No Self-injurious Behavior: No Danger to Others: No Duty to Warn:no Physical Aggression / Violence:No  Access to Firearms a concern: Yes  Gang Involvement:No   Subjective:   Sharon Bryan participated from home, via video, and consented to treatment. Therapist participated from home office. We met online due to [place] pandemic. Sharon Bryan reviewed the events of the past week. She noted continued illness since the last session with the need for additional testing and treatment. She noted numerous health issues since early adulthood and "always" having to deal with something. She noted feelings of helplessness due to being in "limbo" regarding a diagnosis. We began processing current and past  history of health related concerns. We reviewed her coping, during the session, and ways to manage frustrations going forward. We continued to process her relationship stressors during the session. Therapist encouraged Sharon Bryan to identify her concerns and goals for the relationship, going forward. Therapist reviewed assertive and clear/positive communication.  Therapist validated and normalized Sharon Bryan's feelings of  frustration and irritation and sadness.  Therapist encouraged self-care and provided supportive therapy.  Interventions: Interpersonal  Diagnosis: Other depression   Treatment Plan:  Client Abilities/Strengths Sharon Bryan is forthcoming and motivated for change.   Client Treatment Preferences Outpatient Therapy.   Client Statement of Needs Sharon Bryan discussed her goals for treatment including engagement in previously enjoyable activities, managing her overall mood and symptoms, improving her sleep, reduce rumination, improving self-talk, and process past events (including traumatic events).   Treatment Level Weekly  Symptoms Depression: Loss of interest, feeling depressed, lethargy, overeating, feeling bad about self, trouble concentrating, denied SI (hx w/o hospitalization). (Status: maintained)  Anxiety: Feeling anxious, difficulty managing worrying, worry about different things, difficulty relaxing, restlessness, irritability, feeling bad something awful might happen. (Status: maintained)  Goals:   Sharon Bryan experiences symptoms of anxiety with depressive symptoms.    Target Date: 09/17/21 Frequency: Weekly  Progress: 0 Modality: individual    Therapist will provide referrals for additional resources as appropriate.  Therapist will provide psycho-education regarding Sharon Bryan's diagnosis and corresponding treatment approaches and interventions. Sharon Bryan will employ CBT, BA, Problem-solving, Solution Focused, Mindfulness, and coping skills to help manage decrease symptoms associated with her diagnosis.   Reduce overall level, frequency, and intensity of the feelings of depression & anxiety as evidenced by a decrease in symptoms of depression and anxiety  from 6 to 7 days/week to 0 to 1 days/week per client report for at least 3 consecutive months. Verbally express understanding of the relationship between feelings of depression, anxiety and their impact on thinking patterns and  behaviors. Verbalize an understanding of the role that distorted thinking plays in creating fears, excessive worry, and ruminations.  Sharon Bryan participated in the  creation of the treatment plan)    [name], LCSW

## 2021-03-03 NOTE — Telephone Encounter (Signed)
Prior authorization has been approved for Lyrica.   Last refill for this medication was sent in on 10/25/2020 to [facility] on [address]. Patient would like prescription sent to [facility] in [place].

## 2021-03-03 NOTE — Telephone Encounter (Addendum)
Husband calling, he is on phone with insurance and wants to provide a number for you to call for approval  [phone] Clinical pharmacy review team Case no [id number]  Please call husband PJ :[phone]

## 2021-03-04 MED ORDER — PREGABALIN 225 MG PO CAPS: 225.0000 mg | ORAL_CAPSULE | Freq: Every day | ORAL | 1 refills | Status: DC

## 2021-03-05 ENCOUNTER — Other Ambulatory Visit: Payer: Self-pay

## 2021-03-05 ENCOUNTER — Ambulatory Visit
Admission: RE | Admit: 2021-03-05 | Discharge: 2021-03-05 | Disposition: A | Discharge status: Home / Self Care | Payer: Medicare HMO | Source: Ambulatory Visit | Attending: Family Medicine | Admitting: Family Medicine

## 2021-03-05 DIAGNOSIS — N390 Urinary tract infection, site not specified: Secondary | ICD-10-CM | POA: Insufficient documentation

## 2021-03-11 ENCOUNTER — Ambulatory Visit (INDEPENDENT_AMBULATORY_CARE_PROVIDER_SITE_OTHER): Payer: Medicare HMO | Admitting: Psychology

## 2021-03-11 DIAGNOSIS — F3289 Other specified depressive episodes: Secondary | ICD-10-CM

## 2021-03-11 NOTE — Progress Notes (Signed)
[place] Counselor/Therapist Progress Note  Patient ID: Sharon Bryan, MRN: [id number]    Date: 03/11/21  Time Spent: 11:04 am - 11:58 am : 56 Minutes  Treatment Type: Individual Therapy.  Reported Symptoms: Sadness, anxiety, rumination.    Mental Status Exam:  Appearance:  Neat and Well Groomed     Behavior: Appropriate  Motor: Normal  Speech/Language:  Clear and Coherent and Normal Rate  Affect: Congruent  Mood: normal  Thought process: normal  Thought content:   WNL  Sensory/Perceptual disturbances:   WNL  Orientation: oriented to person, place, time/date, and situation  Attention: Good  Concentration: Good  Memory: [place] of knowledge:  Good  Insight:   Good  Judgment:  Good  Impulse Control: Good   Risk Assessment: Danger to Self:  No Self-injurious Behavior: No Danger to Others: No Duty to Warn:no Physical Aggression / Violence:No  Access to Firearms a concern: Yes  Gang Involvement:No   Subjective:   Sharon Bryan participated from home, via video, and consented to treatment. Therapist participated from home office. We met online due to [place] pandemic. Sharon Bryan reviewed the events of the past week. She noted feeling better from her illness and noted a slightly improved mood, overall.  She noted interpersonal stressors, with her DIL, due to her DIL calling her often for emotional support. She noted the the effect of this on her overall mood and level of anxiety. We worked on identifying boundaries, going forward, to engage in consistently. We reviewer assertive communication, boundary setting, and conflict resolution. Therapist modeled these tools during the session. Therapist validated and normalized Sharon Bryan's feelings during the session. Sharon Bryan was engaged and motivated and expressed commitment towards our goals. Therapist encouraged self-care and provided supportive therapy.  Interventions: Interpersonal  Bryan: Other  depression   Treatment Plan:  Client Abilities/Strengths Sharon Bryan is forthcoming and motivated for change.   Client Treatment Preferences Outpatient Therapy.   Client Statement of Needs Sharon Bryan discussed her goals for treatment including engagement in previously enjoyable activities, managing her overall mood and symptoms, improving her sleep, reduce rumination, improving self-talk, and process past events (including traumatic events).   Treatment Level Weekly  Symptoms Depression: Loss of interest, feeling depressed, lethargy, overeating, feeling bad about self, trouble concentrating, denied SI (hx w/o hospitalization). (Status: maintained)  Anxiety: Feeling anxious, difficulty managing worrying, worry about different things, difficulty relaxing, restlessness, irritability, feeling bad something awful might happen. (Status: maintained)  Goals:   Sharon Bryan experiences symptoms of anxiety with depressive symptoms.    Target Date: 09/17/21 Frequency: Weekly  Progress: 0 Modality: individual    Therapist will provide referrals for additional resources as appropriate.  Therapist will provide psycho-education regarding Sharon Bryan and corresponding treatment approaches and interventions. Sharon Bryan will employ CBT, BA, Problem-solving, Solution Focused, Mindfulness, and coping skills to help manage decrease symptoms associated with her Bryan.   Reduce overall level, frequency, and intensity of the feelings of depression & anxiety as evidenced by a decrease in symptoms of depression and anxiety  from 6 to 7 days/week to 0 to 1 days/week per client report for at least 3 consecutive months. Verbally express understanding of the relationship between feelings of depression, anxiety and their impact on thinking patterns and behaviors. Verbalize an understanding of the role that distorted thinking plays in creating fears, excessive worry, and ruminations.  Sharon Bryan participated  in the creation of the treatment plan)   Sharon Irish, LCSW

## 2021-03-16 ENCOUNTER — Ambulatory Visit: Payer: Self-pay | Admitting: *Deleted

## 2021-03-16 NOTE — Telephone Encounter (Signed)
Summary: diarrhea, nausea   Pt husband requesting call back from a nurse stated pt is experiencing diarrhea, nausea since having a kidney infection. Pt husband stated she has already been seen and treated for kidney infection.   Seeking clinical advise.        Chief Complaint: requesting medication , appt  Symptoms: nausea, diarrhea x 4 within last 24 hours, watery,  c/o feeling nausea and lightheaded in am and  completed antibiotics within the last month. Tolerating some fluids but when eating food has diarrhea. Abdominal burning upper abd. Comes and goes  Frequency: since taking antibiotics Pertinent Negatives: Patient denies chest pain, fever. Headache , dizziness now ,  Disposition: [] ED /[] Urgent Care (no appt availability in office) / [x] Appointment(In office/virtual)/ []  [place] [facility]/ [] Home Care/ [] Refused Recommended Disposition /[] [place] [facility]/ []  Follow-up with PCP Additional Notes:   Encouraged to go to ED if symptoms worsen until  My chart VV 03/17/21.       Reason for Disposition  [1] MODERATE diarrhea (e.g., 4-6 times / day more than normal) AND [2] present > 48 hours (2 days)  Answer Assessment - Initial Assessment Questions 1. DIARRHEA SEVERITY: "How bad is the diarrhea?" "How many more stools have you had in the past 24 hours than normal?"    - NO DIARRHEA (SCALE 0)   - MILD (SCALE 1-3): Few loose or mushy BMs; increase of 1-3 stools over normal daily number of stools; mild increase in ostomy output.   -  MODERATE (SCALE 4-7): Increase of 4-6 stools daily over normal; moderate increase in ostomy output. * SEVERE (SCALE 8-10; OR 'WORST POSSIBLE'): Increase of 7 or more stools daily over normal; moderate increase in ostomy output; incontinence.     Moderate 4 x within the last 24 hours  2. ONSET: "When did the diarrhea begin?"      X 1 week  3. BM CONSISTENCY: "How loose or watery is the diarrhea?"      Watery  4. VOMITING: "Are you also  vomiting?" If Yes, ask: "How many times in the past 24 hours?"      No  5. ABDOMINAL PAIN: "Are you having any abdominal pain?" If Yes, ask: "What does it feel like?" (e.g., crampy, dull, intermittent, constant)      Burning in abdomen upper  6. ABDOMINAL PAIN SEVERITY: If present, ask: "How bad is the pain?"  (e.g., Scale 1-10; mild, moderate, or severe)   - MILD (1-3): doesn't interfere with normal activities, abdomen soft and not tender to touch    - MODERATE (4-7): interferes with normal activities or awakens from sleep, abdomen tender to touch    - SEVERE (8-10): excruciating pain, doubled over, unable to do any normal activities       na 7. ORAL INTAKE: If vomiting, "Have you been able to drink liquids?" "How much liquids have you had in the past 24 hours?"     Some  8. HYDRATION: "Any signs of dehydration?" (e.g., dry mouth [not just dry lips], too weak to stand, dizziness, new weight loss) "When did you last urinate?"     Lightheaded in am denies weakness to stand or no urination 9. EXPOSURE: "Have you traveled to a foreign country recently?" "Have you been exposed to anyone with diarrhea?" "Could you have eaten any food that was spoiled?"     na 10. ANTIBIOTIC USE: "Are you taking antibiotics now or have you taken antibiotics in the past 2 months?"  Have taken antibiotic with in the last month  11. OTHER SYMPTOMS: "Do you have any other symptoms?" (e.g., fever, blood in stool)       No  12. PREGNANCY: "Is there any chance you are pregnant?" "When was your last menstrual period?"       na  Protocols used: [facility]

## 2021-03-17 ENCOUNTER — Telehealth (INDEPENDENT_AMBULATORY_CARE_PROVIDER_SITE_OTHER): Payer: Medicare HMO | Admitting: Family Medicine

## 2021-03-17 ENCOUNTER — Encounter: Payer: Self-pay | Admitting: Family Medicine

## 2021-03-17 DIAGNOSIS — R110 Nausea: Secondary | ICD-10-CM | POA: Diagnosis not present

## 2021-03-17 DIAGNOSIS — R197 Diarrhea, unspecified: Secondary | ICD-10-CM | POA: Diagnosis not present

## 2021-03-17 MED ORDER — PROMETHAZINE HCL 25 MG PO TABS: 25.0000 mg | ORAL_TABLET | Freq: Three times a day (TID) | ORAL | 0 refills | Status: DC | PRN

## 2021-03-17 MED ORDER — METRONIDAZOLE 500 MG PO TABS: 500.0000 mg | ORAL_TABLET | Freq: Four times a day (QID) | ORAL | 0 refills | Status: AC

## 2021-03-17 NOTE — Progress Notes (Signed)
MyChart Video Visit    Virtual Visit via Video Note   This visit type was conducted due to national recommendations for restrictions regarding the COVID-19 Pandemic (e.g. social distancing) in an effort to limit this patient's exposure and mitigate transmission in our community. This patient is at least at moderate risk for complications without adequate follow up. This format is felt to be most appropriate for this patient at this time. Physical exam was limited by quality of the video and audio technology used for the visit.   Patient location: home Provider location: [facility]  I discussed the limitations of evaluation and management by telemedicine and the availability of in person appointments. The patient expressed understanding and agreed to proceed.  Patient: Sharon Bryan   DOB: 07-15-1965   56 y.o. Female  MRN: [id number] Visit Date: 03/17/2021  Today's healthcare provider: Lelon Huh, MD   Chief Complaint  Patient presents with   Nausea   Subjective    HPI  Nausea: Patient complains of nausea and diarrhea for the past 2 weeks. Symptoms started after completing a course of antibiotics. Associated symptoms include lightheadedness, and burning epigastric pain. Pain, nausea and diarrhea is aggravated when turning, bending  eating or drinking. She feels symptoms have worsened since onset.     Medications: Outpatient Medications Prior to Visit  Medication Sig   ALPRAZolam (XANAX) 1 MG tablet TAKE 1/2 TO 1 TABLET BY MOUTH EVERY 8 HOURS AS NEEDED   amitriptyline (ELAVIL) 25 MG tablet Take 2-3 tablets (50-75 mg total) by mouth at bedtime.   Aspirin-Acetaminophen-Caffeine (EXCEDRIN PO) Take 1 tablet daily as needed by mouth.   buPROPion (WELLBUTRIN XL) 150 MG 24 hr tablet TAKE 1 TABLET(150 MG) BY MOUTH EVERY MORNING   busPIRone (BUSPAR) 30 MG tablet TAKE 1 TABLET BY MOUTH TWICE DAILY   estradiol (ESTRACE) 2 MG tablet TAKE 1 TABLET(2 MG) BY MOUTH DAILY   lamoTRIgine  (LAMICTAL) 100 MG tablet TAKE 1 TABLET BY MOUTH TWICE DAILY   levothyroxine (SYNTHROID) 75 MCG tablet TAKE 1 TABLET(75 MCG) BY MOUTH DAILY   Magnesium Oxide 420 MG TABS Take 400 mg by mouth daily.   meloxicam (MOBIC) 15 MG tablet TAKE 1 TABLET(15 MG) BY MOUTH DAILY AS NEEDED FOR PAIN   Multiple Vitamins-Minerals (CENTRUM SILVER PO) Take by mouth.   neomycin-polymyxin-hydrocortisone (CORTISPORIN) 3.5-10000-1 OTIC suspension SHAKE LIQUID AND INSTILL 3 TO 4 DROPS TO AFFECTED EAR FOUR TIMES DAILY   omeprazole (PRILOSEC) 20 MG capsule TAKE 1 CAPSULE BY MOUTH EVERY DAY   PARoxetine (PAXIL) 30 MG tablet TAKE 2 TABLETS(60 MG) BY MOUTH DAILY   phenytoin (DILANTIN) 100 MG ER capsule Take 2 capsules (200 mg total) by mouth 2 (two) times daily.   pregabalin (LYRICA) 225 MG capsule Take 1 capsule (225 mg total) by mouth daily.   propranolol (INDERAL) 10 MG tablet Take 1 tablet (10 mg total) by mouth at bedtime.   No facility-administered medications prior to visit.    Review of Systems  Constitutional:  Positive for fatigue. Negative for appetite change, chills and fever.  Respiratory:  Negative for chest tightness and shortness of breath.   Cardiovascular:  Negative for chest pain and palpitations.  Gastrointestinal:  Positive for abdominal pain, diarrhea and nausea. Negative for vomiting.  Neurological:  Positive for light-headedness. Negative for dizziness and weakness.     Objective    LMP 04/26/2002 (LMP Unknown)    Physical Exam  Awake, alert, oriented x 3. In no apparent distress  Assessment & Plan     1. Diarrhea, unspecified type Starting during prolonged course of antibiotic for persistent UTI which has now resolved. Will cover for c. diff - metroNIDAZOLE (FLAGYL) 500 MG tablet; Take 1 tablet (500 mg total) by mouth 4 (four) times daily for 10 days.  Dispense: 40 tablet; Refill: 0  Encourage liberal water intake to keep hydrated.   2. Nausea  - promethazine (PHENERGAN) 25 MG  tablet; Take 1 tablet (25 mg total) by mouth every 8 (eight) hours as needed for nausea or vomiting.  Dispense: 20 tablet; Refill: 0   Call if symptoms change or if not rapidly improving.        I discussed the assessment and treatment plan with the patient. The patient was provided an opportunity to ask questions and all were answered. The patient agreed with the plan and demonstrated an understanding of the instructions.   The patient was advised to call back or seek an in-person evaluation if the symptoms worsen or if the condition fails to improve as anticipated.  I provided 10 minutes of non-face-to-face time during this encounter.  The entirety of the information documented in the History of Present Illness, Review of Systems and Physical Exam were personally obtained by me. Portions of this information were initially documented by the CMA and reviewed by me for thoroughness and accuracy.    Lelon Huh, MD [facility] [phone] (phone) [phone] (fax)  [place]

## 2021-03-29 ENCOUNTER — Ambulatory Visit (INDEPENDENT_AMBULATORY_CARE_PROVIDER_SITE_OTHER): Payer: Medicare HMO | Admitting: Psychology

## 2021-03-29 DIAGNOSIS — F3289 Other specified depressive episodes: Secondary | ICD-10-CM

## 2021-03-29 NOTE — Progress Notes (Signed)
[facility] Counselor/Therapist Progress Note ? ?Patient ID: Sharon Bryan, MRN: [id number]   ? ?Date: 03/29/21 ? ?Time Spent: 2:03 pm - 2:54 pm : 51 Minutes ? ?Treatment Type: Individual Therapy. ? ?Reported Symptoms: Sadness, anxiety, rumination.   ? ?Mental Status Exam: ? ?Appearance:  Neat and Well Groomed     ?Behavior: Appropriate  ?Motor: Normal  ?Speech/Language:  Clear and Coherent and Normal Rate  ?Affect: Congruent  ?Mood: normal  ?Thought process: normal  ?Thought content:   WNL  ?Sensory/Perceptual disturbances:   WNL  ?Orientation: oriented to person, place, time/date, and situation  ?Attention: Good  ?Concentration: Good  ?Memory: WNL  ?Fund of knowledge:  Good  ?Insight:   Good  ?Judgment:  Good  ?Impulse Control: Good  ? ?Risk Assessment: ?Danger to Self:  No ?Self-injurious Behavior: No ?Danger to Others: No ?Duty to Warn:no ?Physical Aggression / Violence:No  ?Access to Firearms a concern: Yes  ?Gang Involvement:No  ? ?Subjective:  ? ?[name] participated from home, via video, and consented to treatment. Therapist participated from home office. We met online due to [place] pandemic. Manuel reviewed the events of the past week. [name] noted interpersonal stressors, with her daughter in-law, due to parenting conflict regarding her grandson, [name]. [name] noted this affecting her sleep overall anxiety level and noted these stressors being "consuming". We explored this, during the session, and her attempts to manage this her feelings. We processed her behavior and approach in regards to how it can affect others. [name] noted a need to address these concerns with her DIL via phone and to work on providing suggestions in a more relaxed and flexible manner. Therapist praised [name] for her effort in the session to identify the aforementioned dynamics. [name] was engaged and motivated and expressed commitment towards our goals. Therapist encouraged self-care and  provided supportive therapy. ? ?Interventions: Interpersonal ? ?Diagnosis: Other depression ? ? ?Treatment Plan: ? ?Client Abilities/Strengths ?Jamee is forthcoming and motivated for change.  ? ?Client Treatment Preferences ?Outpatient Therapy.  ? ?Client Statement of Needs ?Amee discussed her goals for treatment including engagement in previously enjoyable activities, managing her overall mood and symptoms, improving her sleep, reduce rumination, improving self-talk, and process past events (including traumatic events).  ? ?Treatment Level ?Weekly ? ?Symptoms ?Depression: Loss of interest, feeling depressed, lethargy, overeating, feeling bad about self, trouble concentrating, denied SI (hx w/o hospitalization). (Status: maintained) ? ?Anxiety: Feeling anxious, difficulty managing worrying, worry about different things, difficulty relaxing, restlessness, irritability, feeling bad something awful might happen. (Status: maintained) ? ?Goals:  ? ?[name] experiences symptoms of anxiety with depressive symptoms.  ? ? ?Target Date: 09/17/21 Frequency: Weekly  ?Progress: 0 Modality: individual  ? ? ?Therapist will provide referrals for additional resources as appropriate.  ?Therapist will provide psycho-education regarding [name]'s diagnosis and corresponding treatment approaches and interventions. ?[name] will employ CBT, BA, Problem-solving, Solution Focused, Mindfulness, and coping skills to help manage decrease symptoms associated with her diagnosis.  ? Reduce overall level, frequency, and intensity of the feelings of depression & anxiety as evidenced by a decrease in symptoms of depression and anxiety  from 6 to 7 days/week to 0 to 1 days/week per client report for at least 3 consecutive months. ?Verbally express understanding of the relationship between feelings of depression, anxiety and their impact on thinking patterns and behaviors. ?Verbalize an understanding of the role that distorted thinking  plays in creating fears, excessive worry, and ruminations. ? ?([name] participated in the creation of the treatment  plan) ? ? ?[name], LCSW ? ? ?

## 2021-03-31 ENCOUNTER — Other Ambulatory Visit: Payer: Self-pay | Admitting: Family Medicine

## 2021-03-31 DIAGNOSIS — G40909 Epilepsy, unspecified, not intractable, without status epilepticus: Secondary | ICD-10-CM

## 2021-03-31 DIAGNOSIS — F411 Generalized anxiety disorder: Secondary | ICD-10-CM

## 2021-04-09 ENCOUNTER — Encounter: Payer: Self-pay | Admitting: Family Medicine

## 2021-04-09 ENCOUNTER — Ambulatory Visit: Payer: Medicare HMO | Admitting: Psychology

## 2021-04-09 NOTE — Telephone Encounter (Signed)
Per care gaps patient is due for Tdap, COVID, Shingles and influenza vaccine.  Please review and confirm whether you recommend these vaccines for patient. She has [employer].  ?

## 2021-04-13 ENCOUNTER — Ambulatory Visit (INDEPENDENT_AMBULATORY_CARE_PROVIDER_SITE_OTHER): Payer: Medicare HMO

## 2021-04-13 VITALS — Ht 62.0 in | Wt 270.0 lb

## 2021-04-13 DIAGNOSIS — Z1231 Encounter for screening mammogram for malignant neoplasm of breast: Secondary | ICD-10-CM

## 2021-04-13 DIAGNOSIS — Z0000 Encounter for general adult medical examination without abnormal findings: Secondary | ICD-10-CM

## 2021-04-13 NOTE — Progress Notes (Signed)
?Virtual Visit via Video Note ? ?I connected with  Sharon Bryan on 04/13/21 at  3:40 PM EDT via telehealth video enabled device and verified that I am speaking with the correct person using two identifiers. ? ?Location: ?Patient: home ?Provider: [facility] ?Persons participating in the virtual visit: patient/Nurse Health Advisor ?  ?I discussed the limitations, risks, security and privacy concerns of performing an evaluation and management service by telephone and the availability of in person appointments. The patient expressed understanding and agreed to proceed. ? ?Some vital signs may be absent or patient reported.  ? ?Dionisio David, LPN  ? ?Subjective:  ? Sharon Bryan is a 56 y.o. female who presents for Medicare Annual (Subsequent) preventive examination. ? ?Review of Systems    ? ?  ? ?   ?Objective:  ?  ?Today's Vitals  ? 04/13/21 1545  ?Weight: 270 lb (122.5 kg)  ?Height: '5\' 2"'$  (1.575 m)  ? ?Body mass index is 49.38 kg/m?. ? ?Advanced Directives 11/07/2017  ?Does Patient Have a Medical Advance Directive? No  ?Would patient like information on creating a medical advance directive? No - Patient declined  ? ? ?Current Medications (verified) ?Outpatient Encounter Medications as of 04/13/2021  ?Medication Sig  ? ALPRAZolam (XANAX) 1 MG tablet TAKE 1/2 TO 1 TABLET BY MOUTH EVERY 8 HOURS AS NEEDED  ? amitriptyline (ELAVIL) 25 MG tablet Take 2-3 tablets (50-75 mg total) by mouth at bedtime.  ? Aspirin-Acetaminophen-Caffeine (EXCEDRIN PO) Take 1 tablet daily as needed by mouth.  ? buPROPion (WELLBUTRIN XL) 150 MG 24 hr tablet TAKE 1 TABLET(150 MG) BY MOUTH EVERY MORNING  ? busPIRone (BUSPAR) 30 MG tablet TAKE 1 TABLET BY MOUTH TWICE DAILY  ? estradiol (ESTRACE) 2 MG tablet TAKE 1 TABLET(2 MG) BY MOUTH DAILY  ? lamoTRIgine (LAMICTAL) 100 MG tablet TAKE 1 TABLET BY MOUTH TWICE DAILY  ? levothyroxine (SYNTHROID) 75 MCG tablet TAKE 1 TABLET(75 MCG) BY MOUTH DAILY  ? Magnesium Oxide 420 MG TABS Take 400 mg  by mouth daily.  ? meloxicam (MOBIC) 15 MG tablet TAKE 1 TABLET(15 MG) BY MOUTH DAILY AS NEEDED FOR PAIN  ? Multiple Vitamins-Minerals (CENTRUM SILVER PO) Take by mouth.  ? neomycin-polymyxin-hydrocortisone (CORTISPORIN) 3.5-10000-1 OTIC suspension SHAKE LIQUID AND INSTILL 3 TO 4 DROPS TO AFFECTED EAR FOUR TIMES DAILY  ? omeprazole (PRILOSEC) 20 MG capsule TAKE 1 CAPSULE BY MOUTH EVERY DAY  ? PARoxetine (PAXIL) 30 MG tablet TAKE 2 TABLETS(60 MG) BY MOUTH DAILY  ? phenytoin (DILANTIN) 100 MG ER capsule TAKE 2 CAPSULES(200 MG) BY MOUTH TWICE DAILY  ? pregabalin (LYRICA) 225 MG capsule Take 1 capsule (225 mg total) by mouth daily.  ? promethazine (PHENERGAN) 25 MG tablet Take 1 tablet (25 mg total) by mouth every 8 (eight) hours as needed for nausea or vomiting.  ? propranolol (INDERAL) 10 MG tablet Take 1 tablet (10 mg total) by mouth at bedtime.  ? ?No facility-administered encounter medications on file as of 04/13/2021.  ? ? ?Allergies (verified) ?Codeine  ? ?History: ?Past Medical History:  ?Diagnosis Date  ? Allergy   ? Anemia, iron deficiency 09/02/2008  ? 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 Bell's palsy   ? Fibromyalgia   ? (worsening)  ? Menopause 2015  ? Migraine   ? Numbness and tingling of right arm and leg   ? Sciatica 08/04/2014  ? Seizure disorder ([place])   ? Thyroid cyst 10/27/2010  ? ?Past Surgical History:  ?Procedure Laterality Date  ?  ABDOMINAL HYSTERECTOMY  2004  ? Dr. Burke Keels. Partial, ovaries remain  ? CESAREAN SECTION  01/25/1996  ? COLONOSCOPY WITH PROPOFOL N/A 11/07/2017  ? Procedure: COLONOSCOPY WITH PROPOFOL;  Surgeon: Lin Landsman, MD;  Location: [facility] ENDOSCOPY;  Service: Gastroenterology;  Laterality: N/A;  ? CRANIOTOMY  01/25/2003  ? for artetiovenius malformation  ? TUBAL LIGATION  01/25/1996  ? ?Family History  ?Problem Relation Age of Onset  ? Hypertension Mother   ? Arthritis Mother   ? ?Social History  ? ?Socioeconomic History  ? Marital status: Divorced  ?   Spouse name: Not on file  ? Number of children: Not on file  ? Years of education: Not on file  ? Highest education level: Not on file  ?Occupational History  ? Not on file  ?Tobacco Use  ? Smoking status: Former  ?  Types: Cigarettes  ?  Quit date: 01/25/2007  ?  Years since quitting: 14.2  ? Smokeless tobacco: Never  ? Tobacco comments:  ?  Smoked for about 15 years, smoked about 1 pack a day.  ?Vaping Use  ? Vaping Use: Never used  ?Substance and Sexual Activity  ? Alcohol use: Yes  ?  Comment: occasional use; 1-2 times a year  ? Drug use: No  ? Sexual activity: Not on file  ?Other Topics Concern  ? Not on file  ?Social History Narrative  ? Not on file  ? ?Social Determinants of Health  ? ?Financial Resource Strain: Not on file  ?Food Insecurity: Not on file  ?Transportation Needs: Not on file  ?Physical Activity: Not on file  ?Stress: Not on file  ?Social Connections: Not on file  ? ? ?Tobacco Counseling ?Counseling given: Not Answered ?Tobacco comments: Smoked for about 15 years, smoked about 1 pack a day. ? ? ?Clinical Intake: ? ?Pre-visit preparation completed: Yes ? ?Pain : No/denies pain ? ?  ? ?Nutritional Risks: None ?Diabetes: No ? ?How often do you need to have someone help you when you read instructions, pamphlets, or other written materials from your doctor or pharmacy?: 1 - Never ? ?Diabetic?no ? ?Interpreter Needed?: No ? ?Information entered by :: Kirke Shaggy,  LPN ? ? ?Activities of Daily Living ?In your present state of health, do you have any difficulty performing the following activities: 02/22/2021  ?Hearing? N  ?Vision? Y  ?Difficulty concentrating or making decisions? Y  ?Walking or climbing stairs? N  ?Dressing or bathing? N  ?Doing errands, shopping? N  ?Some recent data might be hidden  ? ? ?Patient Care Team: ?Birdie Sons, MD as PCP - General (Family Medicine) ?Ulis Rias, MD as Consulting Physician (Psychiatry) ? ?Indicate any recent Medical Services you may have received from  other than [facility] providers in the past year (date may be approximate). ? ?   ?Assessment:  ? This is a routine wellness examination for [place]. ? ?Hearing/Vision screen ?No results found. ? ?Dietary issues and exercise activities discussed: ?  ? ? Goals Addressed   ?None ?  ? ?Depression Screen ?PHQ 2/9 Scores 02/22/2021 03/03/2020 11/04/2019 11/04/2019 06/29/2018 11/26/2014  ?PHQ - 2 Score '4 6 2 2 '$ 0 1  ?PHQ- 9 Score '17 15 6 7 5 11  '$ ?  ?Fall Risk ?Fall Risk  02/22/2021 11/04/2019  ?Falls in the past year? 0 0  ?Number falls in past yr: 0 0  ?Injury with Fall? 0 0  ?Risk for fall due to : No Fall Risks -  ?Follow up Falls evaluation  completed -  ? ? ?FALL RISK PREVENTION PERTAINING TO THE HOME: ? ?Any stairs in or around the home? Yes  ?If so, are there any without handrails? Yes  ?Home free of loose throw rugs in walkways, pet beds, electrical cords, etc? Yes  ?Adequate lighting in your home to reduce risk of falls? Yes  ? ?ASSISTIVE DEVICES UTILIZED TO PREVENT FALLS: ? ?Life alert? Yes  ?Use of a cane, walker or w/c? No  ?Grab bars in the bathroom? No  ?Shower chair or bench in shower? No  ?Elevated toilet seat or a handicapped toilet? No  ? ? ?Cognitive Function:Normal cognitive status assessed by direct observation by this Nurse Health Advisor. No abnormalities found.  ? ?  ?  ? ?Immunizations ?Immunization History  ?Administered Date(s) Administered  ? Influenza,inj,Quad PF,6+ Mos 11/26/2014, 10/30/2015, 12/01/2016  ? PFIZER(Purple Top)SARS-COV-2 Vaccination 04/15/2019, 05/13/2019, 02/25/2020  ? Td 01/07/1999  ? Tdap 01/06/2011  ? ? ?TDAP status: Due, Education has been provided regarding the importance of this vaccine. Advised may receive this vaccine at local pharmacy or Health Dept. Aware to provide a copy of the vaccination record if obtained from local pharmacy or Health Dept. Verbalized acceptance and understanding. ? ?Flu Vaccine status: Declined, Education has been provided regarding the importance of this  vaccine but patient still declined. Advised may receive this vaccine at local pharmacy or Health Dept. Aware to provide a copy of the vaccination record if obtained from local pharmacy or Health Dept. Verbalized

## 2021-04-13 NOTE — Patient Instructions (Addendum)
Ms. Caloca , ?Thank you for taking time to come for your Medicare Wellness Visit. I appreciate your ongoing commitment to your health goals. Please review the following plan we discussed and let me know if I can assist you in the future.  ? ?Screening recommendations/referrals: ?Colonoscopy: 11/07/17, due next year ?Mammogram: referral sent ?Bone Density: n/d ?Recommended yearly ophthalmology/optometry visit for glaucoma screening and checkup ?Recommended yearly dental visit for hygiene and checkup ? ?Vaccinations: ?Influenza vaccine: n/d ?Pneumococcal vaccine: n/d ?Tdap vaccine: 01/06/11, due ?Shingles vaccine: n/d  ?Covid-19: 04/15/19, 05/13/19, 02/25/20 ? ?Advanced directives: no ? ?Conditions/risks identified: none ? ?Next appointment: Follow up in one year for your annual wellness visit.-  04/18/22 @ 11am by video ? ?Preventive Care 40-64 Years, Female ?Preventive care refers to lifestyle choices and visits with your health care provider that can promote health and wellness. ?What does preventive care include? ?A yearly physical exam. This is also called an annual well check. ?Dental exams once or twice a year. ?Routine eye exams. Ask your health care provider how often you should have your eyes checked. ?Personal lifestyle choices, including: ?Daily care of your teeth and gums. ?Regular physical activity. ?Eating a healthy diet. ?Avoiding tobacco and drug use. ?Limiting alcohol use. ?Practicing safe sex. ?Taking low-dose aspirin daily starting at age 23. ?Taking vitamin and mineral supplements as recommended by your health care provider. ?What happens during an annual well check? ?The services and screenings done by your health care provider during your annual well check will depend on your age, overall health, lifestyle risk factors, and family history of disease. ?Counseling  ?Your health care provider may ask you questions about your: ?Alcohol use. ?Tobacco use. ?Drug use. ?Emotional well-being. ?Home and  relationship well-being. ?Sexual activity. ?Eating habits. ?Work and work Statistician. ?Method of birth control. ?Menstrual cycle. ?Pregnancy history. ?Screening  ?You may have the following tests or measurements: ?Height, weight, and BMI. ?Blood pressure. ?Lipid and cholesterol levels. These may be checked every 5 years, or more frequently if you are over 41 years old. ?Skin check. ?Lung cancer screening. You may have this screening every year starting at age 73 if you have a 30-pack-year history of smoking and currently smoke or have quit within the past 15 years. ?Fecal occult blood test (FOBT) of the stool. You may have this test every year starting at age 29. ?Flexible sigmoidoscopy or colonoscopy. You may have a sigmoidoscopy every 5 years or a colonoscopy every 10 years starting at age 47. ?Hepatitis C blood test. ?Hepatitis B blood test. ?Sexually transmitted disease (STD) testing. ?Diabetes screening. This is done by checking your blood sugar (glucose) after you have not eaten for a while (fasting). You may have this done every 1-3 years. ?Mammogram. This may be done every 1-2 years. Talk to your health care provider about when you should start having regular mammograms. This may depend on whether you have a family history of breast cancer. ?BRCA-related cancer screening. This may be done if you have a family history of breast, ovarian, tubal, or peritoneal cancers. ?Pelvic exam and Pap test. This may be done every 3 years starting at age 73. Starting at age 27, this may be done every 5 years if you have a Pap test in combination with an HPV test. ?Bone density scan. This is done to screen for osteoporosis. You may have this scan if you are at high risk for osteoporosis. ?Discuss your test results, treatment options, and if necessary, the need for more tests with  your health care provider. ?Vaccines  ?Your health care provider may recommend certain vaccines, such as: ?Influenza vaccine. This is recommended  every year. ?Tetanus, diphtheria, and acellular pertussis (Tdap, Td) vaccine. You may need a Td booster every 10 years. ?Zoster vaccine. You may need this after age 75. ?Pneumococcal 13-valent conjugate (PCV13) vaccine. You may need this if you have certain conditions and were not previously vaccinated. ?Pneumococcal polysaccharide (PPSV23) vaccine. You may need one or two doses if you smoke cigarettes or if you have certain conditions. ?Talk to your health care provider about which screenings and vaccines you need and how often you need them. ?This information is not intended to replace advice given to you by your health care provider. Make sure you discuss any questions you have with your health care provider. ?Document Released: 02/06/2015 Document Revised: 09/30/2015 Document Reviewed: 11/11/2014 ?Elsevier Interactive Patient Education ? 2017 [employer] ? ? ? ?Fall Prevention in the Home ?Falls can cause injuries. They can happen to people of all ages. There are many things you can do to make your home safe and to help prevent falls. ?What can I do on the outside of my home? ?Regularly fix the edges of walkways and driveways and fix any cracks. ?Remove anything that might make you trip as you walk through a door, such as a raised step or threshold. ?Trim any bushes or trees on the path to your home. ?Use bright outdoor lighting. ?Clear any walking paths of anything that might make someone trip, such as rocks or tools. ?Regularly check to see if handrails are loose or broken. Make sure that both sides of any steps have handrails. ?Any raised decks and porches should have guardrails on the edges. ?Have any leaves, snow, or ice cleared regularly. ?Use sand or salt on walking paths during winter. ?Clean up any spills in your garage right away. This includes oil or grease spills. ?What can I do in the bathroom? ?Use night lights. ?Install grab bars by the toilet and in the tub and shower. Do not use towel bars as  grab bars. ?Use non-skid mats or decals in the tub or shower. ?If you need to sit down in the shower, use a plastic, non-slip stool. ?Keep the floor dry. Clean up any water that spills on the floor as soon as it happens. ?Remove soap buildup in the tub or shower regularly. ?Attach bath mats securely with double-sided non-slip rug tape. ?Do not have throw rugs and other things on the floor that can make you trip. ?What can I do in the bedroom? ?Use night lights. ?Make sure that you have a light by your bed that is easy to reach. ?Do not use any sheets or blankets that are too big for your bed. They should not hang down onto the floor. ?Have a firm chair that has side arms. You can use this for support while you get dressed. ?Do not have throw rugs and other things on the floor that can make you trip. ?What can I do in the kitchen? ?Clean up any spills right away. ?Avoid walking on wet floors. ?Keep items that you use a lot in easy-to-reach places. ?If you need to reach something above you, use a strong step stool that has a grab bar. ?Keep electrical cords out of the way. ?Do not use floor polish or wax that makes floors slippery. If you must use wax, use non-skid floor wax. ?Do not have throw rugs and other things on the floor  that can make you trip. ?What can I do with my stairs? ?Do not leave any items on the stairs. ?Make sure that there are handrails on both sides of the stairs and use them. Fix handrails that are broken or loose. Make sure that handrails are as long as the stairways. ?Check any carpeting to make sure that it is firmly attached to the stairs. Fix any carpet that is loose or worn. ?Avoid having throw rugs at the top or bottom of the stairs. If you do have throw rugs, attach them to the floor with carpet tape. ?Make sure that you have a light switch at the top of the stairs and the bottom of the stairs. If you do not have them, ask someone to add them for you. ?What else can I do to help prevent  falls? ?Wear shoes that: ?Do not have high heels. ?Have rubber bottoms. ?Are comfortable and fit you well. ?Are closed at the toe. Do not wear sandals. ?If you use a stepladder: ?Make sure that it is ful

## 2021-04-14 ENCOUNTER — Other Ambulatory Visit: Payer: Self-pay | Admitting: Family Medicine

## 2021-04-14 ENCOUNTER — Encounter: Payer: Self-pay | Admitting: Family Medicine

## 2021-04-14 DIAGNOSIS — G4700 Insomnia, unspecified: Secondary | ICD-10-CM

## 2021-04-14 MED ORDER — NEOMYCIN-POLYMYXIN-HC 3.5-10000-1 OT SUSP: OTIC | 3 refills | Status: DC

## 2021-04-14 NOTE — Telephone Encounter (Signed)
Last refilled/ prescribed: 02/10/2020 qty 54m with 1 refilll ?

## 2021-04-23 ENCOUNTER — Ambulatory Visit (INDEPENDENT_AMBULATORY_CARE_PROVIDER_SITE_OTHER): Payer: Medicare HMO | Admitting: Psychology

## 2021-04-23 DIAGNOSIS — F3289 Other specified depressive episodes: Secondary | ICD-10-CM

## 2021-04-23 NOTE — Progress Notes (Signed)
[facility] Counselor/Therapist Progress Note ? ?Patient ID: TERRISA CURFMAN, MRN: [id number]   ? ?Date: 04/23/21 ? ?Time Spent: 2:04 pm - 2:53 pm : 49 Minutes ? ?Treatment Type: Individual Therapy. ? ?Reported Symptoms: Sadness, anxiety, rumination.   ? ?Mental Status Exam: ? ?Appearance:  Neat and Well Groomed     ?Behavior: Appropriate  ?Motor: Normal  ?Speech/Language:  Clear and Coherent and Normal Rate  ?Affect: Congruent  ?Mood: normal  ?Thought process: normal  ?Thought content:   WNL  ?Sensory/Perceptual disturbances:   WNL  ?Orientation: oriented to person, place, time/date, and situation  ?Attention: Good  ?Concentration: Good  ?Memory: WNL  ?Fund of knowledge:  Good  ?Insight:   Good  ?Judgment:  Good  ?Impulse Control: Good  ? ?Risk Assessment: ?Danger to Self:  No ?Self-injurious Behavior: No ?Danger to Others: No ?Duty to Warn:no ?Physical Aggression / Violence:No  ?Access to Firearms a concern: Yes  ?Gang Involvement:No  ? ?Subjective:  ? ?[name] participated from home, via video, and consented to treatment. Therapist participated from home office. We met online due to [place] pandemic. Shakema reviewed the events of the past week. She noted improvement in her grandson's relationship. [name] endorsed the onset of nightmares since our last visit. She discussed interpersonal strain, with her husband, and noted difficulty navigating these stressors and noted the effect on her overall mood. [name] noted her frustration regarding her attempts to resolve this issue, unsuccessfully. She noted often not advocating for self and being "last" in her house due to being the youngest. She noted "having a really hard time standing up for self". She related this to her childhood and how she was treated and a lack of prioritization along with religous influence including eschewing "vanity". We will work on processing this, going forward. [name] was engaged and motivated and expressed  commitment towards our goals. Therapist encouraged self-care and provided supportive therapy. ? ?Interventions: Interpersonal ? ?Diagnosis: Other depression ? ? ?Treatment Plan: ? ?Client Abilities/Strengths ?Jevaeh is forthcoming and motivated for change.  ? ?Client Treatment Preferences ?Outpatient Therapy.  ? ?Client Statement of Needs ?Maureena discussed her goals for treatment including engagement in previously enjoyable activities, managing her overall mood and symptoms, improving her sleep, reduce rumination, improving self-talk, and process past events (including traumatic events).  ? ?Treatment Level ?Weekly ? ?Symptoms ?Depression: Loss of interest, feeling depressed, lethargy, overeating, feeling bad about self, trouble concentrating, denied SI (hx w/o hospitalization). (Status: maintained) ? ?Anxiety: Feeling anxious, difficulty managing worrying, worry about different things, difficulty relaxing, restlessness, irritability, feeling bad something awful might happen. (Status: maintained) ? ?Goals:  ? ?Lashara experiences symptoms of anxiety with depressive symptoms.  ? ? ?Target Date: 09/17/21 Frequency: Weekly  ?Progress: 0 Modality: individual  ? ? ?Therapist will provide referrals for additional resources as appropriate.  ?Therapist will provide psycho-education regarding Avory's diagnosis and corresponding treatment approaches and interventions. ?Hailee will employ CBT, BA, Problem-solving, Solution Focused, Mindfulness, and coping skills to help manage decrease symptoms associated with her diagnosis.  ? Reduce overall level, frequency, and intensity of the feelings of depression & anxiety as evidenced by a decrease in symptoms of depression and anxiety  from 6 to 7 days/week to 0 to 1 days/week per client report for at least 3 consecutive months. ?Verbally express understanding of the relationship between feelings of depression, anxiety and their impact on thinking patterns and  behaviors. ?Verbalize an understanding of the role that distorted thinking plays in creating fears, excessive worry,  and ruminations. ? ?(Dasiah participated in the creation of the treatment plan) ? ? ?[name], LCSW ? ? ?

## 2021-04-29 ENCOUNTER — Other Ambulatory Visit: Payer: Self-pay | Admitting: Family Medicine

## 2021-04-30 ENCOUNTER — Telehealth: Payer: Self-pay

## 2021-04-30 MED ORDER — PREGABALIN 225 MG PO CAPS
225.0000 mg | ORAL_CAPSULE | Freq: Two times a day (BID) | ORAL | 1 refills | Status: DC
Start: 2021-04-30 — End: 2021-07-25

## 2021-04-30 NOTE — Telephone Encounter (Signed)
I called [facility] in [place] [place] and confirmed that this medication was not in stock. Pharmacist states that it is not available for then to order in their system. I called [facility] to check if they have it in stock. [facility] states that they do not have it in stock but they can order it todayand it should come in on Monday. I called and spoke with patient's spouse PJ advising him of this.  He states he was aware and is fine with that. Please review refill request.  ?

## 2021-04-30 NOTE — Telephone Encounter (Signed)
Pt spouse PJ called to report that Lyrica sent by provider is not available at the original pharmacy. The pt would like the medication to go to [facility]. Please notified pt spouse PJ at [phone]. ?

## 2021-04-30 NOTE — Addendum Note (Signed)
Addended by: Randal Buba on: 04/30/2021 12:11 PM ? ? Modules accepted: Orders ? ?

## 2021-04-30 NOTE — Addendum Note (Signed)
Addended by: Birdie Sons on: 04/30/2021 01:13 PM ? ? Modules accepted: Orders ? ?

## 2021-05-11 ENCOUNTER — Other Ambulatory Visit: Payer: Self-pay | Admitting: Family Medicine

## 2021-05-14 ENCOUNTER — Encounter: Payer: Self-pay | Admitting: Family Medicine

## 2021-05-14 DIAGNOSIS — D229 Melanocytic nevi, unspecified: Secondary | ICD-10-CM

## 2021-05-15 ENCOUNTER — Other Ambulatory Visit: Payer: Self-pay | Admitting: Family Medicine

## 2021-05-15 DIAGNOSIS — G4700 Insomnia, unspecified: Secondary | ICD-10-CM

## 2021-05-21 ENCOUNTER — Ambulatory Visit (INDEPENDENT_AMBULATORY_CARE_PROVIDER_SITE_OTHER): Payer: Medicare HMO | Admitting: Psychology

## 2021-05-21 DIAGNOSIS — F3289 Other specified depressive episodes: Secondary | ICD-10-CM | POA: Diagnosis not present

## 2021-05-21 NOTE — Progress Notes (Signed)
[facility] Counselor/Therapist Progress Note ? ?Patient ID: Sharon Bryan, MRN: [id number]   ? ?Date: 05/21/21 ? ?Time Spent: 10:34 am - 11:24 am : 50 Minutes ? ?Treatment Type: Individual Therapy. ? ?Reported Symptoms: Sadness, anxiety, rumination.   ? ?Mental Status Exam: ? ?Appearance:  Neat and Well Groomed     ?Behavior: Appropriate  ?Motor: Normal  ?Speech/Language:  Clear and Coherent and Normal Rate  ?Affect: Congruent  ?Mood: normal  ?Thought process: normal  ?Thought content:   WNL  ?Sensory/Perceptual disturbances:   WNL  ?Orientation: oriented to person, place, time/date, and situation  ?Attention: Good  ?Concentration: Good  ?Memory: WNL  ?Fund of knowledge:  Good  ?Insight:   Good  ?Judgment:  Good  ?Impulse Control: Good  ? ?Risk Assessment: ?Danger to Self:  No ?Self-injurious Behavior: No ?Danger to Others: No ?Duty to Warn:no ?Physical Aggression / Violence:No  ?Access to Firearms a concern: Yes  ?Gang Involvement:No  ? ?Subjective:  ? ?Sharon Bryan participated from home, via video, and consented to treatment. Therapist participated from home office. We met online due to [place] pandemic. Sharon Bryan reviewed the events of the past week. Sharon Bryan  noted having a "bad week" and endorsed sleeping poorly.  She noted her attempts to contact a friend Sharon Bryan) who disconnected from her abruptly. She noted internalizing this experience and identified negative self-talk "what's wrong with me?" She discussed feelings of self-blame and responsibility. She discussed frequent rumination regarding this thought in relation to various relationship breakdowns. Therapist highlighted emotional thinking/reasoning during the session, a cognitive distortion, and discussed ways to be mindful of and process through. Therapist modeled this during the session. Therapist encouraged setting limits regarding rumination, challenging negative self-talk and thoughts, and engaging in enjoyable activities  in lieu of rumination. Sharon Bryan was engaged and motivated and expressed commitment towards our goals. Therapist encouraged self-care and provided supportive therapy. ? ?Interventions: Interpersonal ? ?Diagnosis: Other depression ? ? ?Treatment Plan: ? ?Client Abilities/Strengths ?Sharon Bryan is forthcoming and motivated for change.  ? ?Client Treatment Preferences ?Outpatient Therapy.  ? ?Client Statement of Needs ?Sharon Bryan discussed her goals for treatment including engagement in previously enjoyable activities, managing her overall mood and symptoms, improving her sleep, reduce rumination, improving self-talk, and process past events (including traumatic events).  ? ?Treatment Level ?Weekly ? ?Symptoms ?Depression: Loss of interest, feeling depressed, lethargy, overeating, feeling bad about self, trouble concentrating, denied SI (hx w/o hospitalization). (Status: maintained) ? ?Anxiety: Feeling anxious, difficulty managing worrying, worry about different things, difficulty relaxing, restlessness, irritability, feeling bad something awful might happen. (Status: maintained) ? ?Goals:  ? ?Sharon Bryan experiences symptoms of anxiety with depressive symptoms.  ? ? ?Target Date: 09/17/21 Frequency: Weekly  ?Progress: 0 Modality: individual  ? ? ?Therapist will provide referrals for additional resources as appropriate.  ?Therapist will provide psycho-education regarding Sharon Bryan's diagnosis and corresponding treatment approaches and interventions. ?Sharon Bryan will employ CBT, BA, Problem-solving, Solution Focused, Mindfulness, and coping skills to help manage decrease symptoms associated with her diagnosis.  ? Reduce overall level, frequency, and intensity of the feelings of depression & anxiety as evidenced by a decrease in symptoms of depression and anxiety  from 6 to 7 days/week to 0 to 1 days/week per client report for at least 3 consecutive months. ?Verbally express understanding of the relationship between feelings of  depression, anxiety and their impact on thinking patterns and behaviors. ?Verbalize an understanding of the role that distorted thinking plays in creating fears, excessive worry, and ruminations. ? ?(Sharon Bryan participated  in the creation of the treatment plan) ? ? ?Sharon Irish, Sharon Bryan ? ? ?

## 2021-05-24 ENCOUNTER — Other Ambulatory Visit: Payer: Self-pay | Admitting: Family Medicine

## 2021-05-28 ENCOUNTER — Encounter: Payer: Medicare HMO | Admitting: Psychology

## 2021-05-28 NOTE — Progress Notes (Signed)
This encounter was created in error - please disregard.

## 2021-06-04 ENCOUNTER — Ambulatory Visit: Payer: Medicare HMO | Admitting: Psychology

## 2021-06-13 ENCOUNTER — Other Ambulatory Visit: Payer: Self-pay | Admitting: Family Medicine

## 2021-06-15 ENCOUNTER — Other Ambulatory Visit: Payer: Self-pay | Admitting: Family Medicine

## 2021-06-15 DIAGNOSIS — F32A Depression, unspecified: Secondary | ICD-10-CM

## 2021-06-18 ENCOUNTER — Ambulatory Visit
Admission: RE | Admit: 2021-06-18 | Discharge: 2021-06-18 | Disposition: A | Discharge status: Home / Self Care | Payer: Medicare HMO | Attending: Family Medicine | Admitting: Family Medicine

## 2021-06-18 ENCOUNTER — Ambulatory Visit
Admission: RE | Admit: 2021-06-18 | Discharge: 2021-06-18 | Disposition: A | Discharge status: Home / Self Care | Payer: Medicare HMO | Source: Ambulatory Visit | Attending: Family Medicine | Admitting: Family Medicine

## 2021-06-18 ENCOUNTER — Ambulatory Visit (INDEPENDENT_AMBULATORY_CARE_PROVIDER_SITE_OTHER): Payer: Medicare HMO | Admitting: Family Medicine

## 2021-06-18 ENCOUNTER — Encounter: Payer: Self-pay | Admitting: Family Medicine

## 2021-06-18 VITALS — BP 113/64 | HR 79 | Temp 98.0°F | Resp 18 | Wt 269.0 lb

## 2021-06-18 DIAGNOSIS — S86912A Strain of unspecified muscle(s) and tendon(s) at lower leg level, left leg, initial encounter: Secondary | ICD-10-CM

## 2021-06-18 DIAGNOSIS — S86911A Strain of unspecified muscle(s) and tendon(s) at lower leg level, right leg, initial encounter: Secondary | ICD-10-CM

## 2021-06-18 DIAGNOSIS — M1711 Unilateral primary osteoarthritis, right knee: Secondary | ICD-10-CM | POA: Insufficient documentation

## 2021-06-18 MED ORDER — WEGOVY 0.25 MG/0.5ML ~~LOC~~ SOAJ: 0.2500 mg | SUBCUTANEOUS | 0 refills | Status: DC

## 2021-06-18 NOTE — Patient Instructions (Signed)
Please review the attached list of medications and notify my office if there are any errors.   Go to the [facility] on [address] for left knee Xray   Try OTC diclofenac 1% Gel (Voltaren gel) 3-4  times a day around your sore knee

## 2021-06-18 NOTE — Progress Notes (Signed)
I,Roshena L Chambers,acting as a scribe for Lelon Huh, MD.,have documented all relevant documentation on the behalf of Lelon Huh, MD,as directed by  Lelon Huh, MD while in the presence of Lelon Huh, MD.   Established patient visit   Patient: Sharon Bryan   DOB: [birth year]   56 y.o. Female  MRN: [id number] Visit Date: 06/18/2021  Today's healthcare provider: Lelon Huh, MD   Chief Complaint  Patient presents with   Knee Pain   Subjective    Knee Pain  Incident onset: 1 month ago. There was no injury mechanism. The pain is present in the right knee and left knee (worse in right knee). Associated symptoms include muscle weakness (right leg). Treatments tried: knee brace. The treatment provided moderate relief.   She does have prescription for meloxicam but has been out for a couple of weeks. Has been taking OTC ibuprofen up to '800mg'$  a couple of times a day which only provides temporary relief.   She is also interested in trial of [facility] for weight loss. Has tried several medications and diets in the past which were not effective.   She does report that depression symptoms are doing much better and is tolerating current medications very well. Medications: Outpatient Medications Prior to Visit  Medication Sig   ALPRAZolam (XANAX) 1 MG tablet TAKE 1/2 TO 1 TABLET BY MOUTH EVERY 8 HOURS AS NEEDED   amitriptyline (ELAVIL) 25 MG tablet TAKE 2 TO 3 TABLETS(50 TO 75 MG) BY MOUTH AT BEDTIME   Aspirin-Acetaminophen-Caffeine (EXCEDRIN PO) Take 1 tablet daily as needed by mouth.   busPIRone (BUSPAR) 30 MG tablet TAKE 1 TABLET BY MOUTH TWICE DAILY   estradiol (ESTRACE) 2 MG tablet TAKE 1 TABLET(2 MG) BY MOUTH DAILY   lamoTRIgine (LAMICTAL) 100 MG tablet TAKE 1 TABLET BY MOUTH TWICE DAILY   levothyroxine (SYNTHROID) 75 MCG tablet TAKE 1 TABLET(75 MCG) BY MOUTH DAILY   Magnesium Oxide 420 MG TABS Take 400 mg by mouth daily.   meloxicam (MOBIC) 15 MG tablet TAKE 1  TABLET(15 MG) BY MOUTH DAILY AS NEEDED FOR PAIN   Multiple Vitamins-Minerals (CENTRUM SILVER PO) Take by mouth.   neomycin-polymyxin-hydrocortisone (CORTISPORIN) 3.5-10000-1 OTIC suspension SHAKE LIQUID AND INSTILL 3 TO 4 DROPS TO AFFECTED EAR FOUR TIMES DAILY   omeprazole (PRILOSEC) 20 MG capsule TAKE 1 CAPSULE BY MOUTH EVERY DAY   PARoxetine (PAXIL) 30 MG tablet TAKE 2 TABLETS(60 MG) BY MOUTH DAILY   phenytoin (DILANTIN) 100 MG ER capsule TAKE 2 CAPSULES(200 MG) BY MOUTH TWICE DAILY   pregabalin (LYRICA) 225 MG capsule Take 1 capsule (225 mg total) by mouth 2 (two) times daily.   propranolol (INDERAL) 10 MG tablet Take 1 tablet (10 mg total) by mouth at bedtime.   buPROPion (WELLBUTRIN XL) 150 MG 24 hr tablet TAKE 1 TABLET(150 MG) BY MOUTH EVERY MORNING   promethazine (PHENERGAN) 25 MG tablet Take 1 tablet (25 mg total) by mouth every 8 (eight) hours as needed for nausea or vomiting. (Patient not taking: Reported on 04/13/2021)   No facility-administered medications prior to visit.    Review of Systems  Constitutional:  Negative for appetite change, chills, fatigue and fever.  Respiratory:  Negative for chest tightness and shortness of breath.   Cardiovascular:  Negative for chest pain and palpitations.  Gastrointestinal:  Negative for abdominal pain, nausea and vomiting.  Musculoskeletal:  Positive for arthralgias (right knee pain) and gait problem.  Neurological:  Positive for weakness (in right leg).  Negative for dizziness.      Objective    BP 113/64 (BP Location: Left Arm, Patient Position: Sitting, Cuff Size: Large)   Pulse 79   Temp 98 F (36.7 C) (Oral)   Resp 18   Wt 269 lb (122 kg)   LMP 04/26/2002 (LMP Unknown)   SpO2 99% Comment: room air  BMI 49.20 kg/m    Physical Exam    General: Appearance:    Obese female in no acute distress  Eyes:    PERRL, conjunctiva/corneas clear, EOM's intact       Lungs:     Clear to auscultation bilaterally, respirations unlabored   Heart:    Normal heart rate. Normal rhythm. No murmurs, rubs, or gallops.    MS:   All extremities are intact.  No joint line tenderness or swelling. Swelling posteriorly suspicious for Bakers Cysts. Stable knee joints.    Neurologic:   Awake, alert, oriented x 3. No apparent focal neurological defect.         Assessment & Plan     1. Strain of both knees, initial encounter Exam suspicious for Bakers cyst. Advise these occasionally show on Xrays, but usually requires ultrasound. Need to determine how much OA is a factor.  - DG Knee Complete 4 Views Right; Future  Can continue ibuprofen, recommend elastic knee brace and can try diclofenac gel.  Consider orthopedic referral after reviewing xray results.   2. Morbid obesity ([place]) Failed several previous prescriptions and weight loss plans.   Will try - Semaglutide-Weight Management (WEGOVY) 0.25 MG/0.5ML SOAJ; Inject 0.25 mg into the skin once a week.  Dispense: 2 mL; Refill: 0         The entirety of the information documented in the History of Present Illness, Review of Systems and Physical Exam were personally obtained by me. Portions of this information were initially documented by the CMA and reviewed by me for thoroughness and accuracy.     Lelon Huh, MD  [facility] [phone] (phone) [phone] (fax)  [place]

## 2021-06-21 ENCOUNTER — Ambulatory Visit (INDEPENDENT_AMBULATORY_CARE_PROVIDER_SITE_OTHER): Payer: Medicare HMO | Admitting: Psychology

## 2021-06-21 DIAGNOSIS — F3289 Other specified depressive episodes: Secondary | ICD-10-CM | POA: Diagnosis not present

## 2021-06-21 NOTE — Progress Notes (Signed)
[facility] Counselor/Therapist Progress Note  Patient ID: Sharon Bryan, MRN: [id number]    Date: 06/21/21  Time Spent: 4:34 pm - 5:23  pm : 49Minutes  Treatment Type: Individual Therapy.  Reported Symptoms: Sadness, anxiety, rumination.    Mental Status Exam:  Appearance:  Neat and Well Groomed     Behavior: Appropriate  Motor: Normal  Speech/Language:  Clear and Coherent and Normal Rate  Affect: Congruent  Mood: normal  Thought process: normal  Thought content:   WNL  Sensory/Perceptual disturbances:   WNL  Orientation: oriented to person, place, time/date, and situation  Attention: Good  Concentration: Good  Memory: WNL  Fund of knowledge:  Good  Insight:   Good  Judgment:  Good  Impulse Control: Good   Risk Assessment: Danger to Self:  No Self-injurious Behavior: No Danger to Others: No Duty to Warn:no Physical Aggression / Violence:No  Access to Firearms a concern: Yes  Gang Involvement:No   Subjective:   Sharon Bryan participated from home, via video, and consented to treatment. Therapist participated from home office. We met online due to COVID pandemic. Sharon Bryan reviewed the events of the past week. Sharon Bryan noted doing pretty "good". [name] noted a recent knee injury and having to visit her provider. Sharon Bryan noted this affecting her ability to participate in physical activities. She noted improvement in her overall mood and discussed her recent PHQ-9 with her provide highlighting improvement in her mood. Sharon Bryan noted some lethargy and low motivation during rainy days. She noted her attempts to engage in enjoyable activities including reading, during this time. She noted setting boundaries  for self regarding painful memories and recollections and employing redirection. Therapist reinforced Sharon Bryan's behaviors and encouraged consistency in this area. Sharon Bryan was engaged and motivated and expressed commitment towards our goals.  Therapist encouraged self-care and provided supportive therapy.  Interventions: Cognitive Behavioral Therapy and Psycho-education/Bibliotherapy  Diagnosis: Other depression   Treatment Plan:  Client Abilities/Strengths Sharon Bryan is forthcoming and motivated for change.   Client Treatment Preferences Outpatient Therapy.   Client Statement of Needs Sharon Bryan discussed her goals for treatment including engagement in previously enjoyable activities, managing her overall mood and symptoms, improving her sleep, reduce rumination, improving self-talk, and process past events (including traumatic events).   Treatment Level Weekly  Symptoms Depression: Loss of interest, feeling depressed, lethargy, overeating, feeling bad about self, trouble concentrating, denied SI (hx w/o hospitalization). (Status: improved) Anxiety: Feeling anxious, difficulty managing worrying, worry about different things, difficulty relaxing, restlessness, irritability, feeling bad something awful might happen. (Status: improved)  Goals:   Sharon Bryan experiences symptoms of anxiety with depressive symptoms.    Target Date: 09/17/21 Frequency: Weekly  Progress: 20 Modality: individual    Therapist will provide referrals for additional resources as appropriate.  Therapist will provide psycho-education regarding Sharon Bryan diagnosis and corresponding treatment approaches and interventions. Sharon Bryan will employ CBT, BA, Problem-solving, Solution Focused, Mindfulness, and coping skills to help manage decrease symptoms associated with her diagnosis.   Reduce overall level, frequency, and intensity of the feelings of depression & anxiety as evidenced by a decrease in symptoms of depression and anxiety  from 6 to 7 days/week to 0 to 1 days/week per client report for at least 3 consecutive months. Verbally express understanding of the relationship between feelings of depression, anxiety and their impact on thinking patterns and  behaviors. Verbalize an understanding of the role that distorted thinking plays in creating fears, excessive worry, and ruminations.  (Sharon Bryan participated in the creation of the   treatment plan)   Sharon Irish, LCSW

## 2021-06-25 ENCOUNTER — Telehealth: Payer: Self-pay | Admitting: Family Medicine

## 2021-06-25 NOTE — Telephone Encounter (Signed)
PJ, husband calling to follow up on PA for the pt's Semaglutide-Weight Management (WEGOVY) 0.25 MG/0.5ML SOAJ  Not seeing anything about a prior auth.  He would like a call back.

## 2021-06-28 NOTE — Telephone Encounter (Signed)
Patient informed I have called her pharmacy ([facility], [place]) and they are to resend PA.

## 2021-07-02 NOTE — Telephone Encounter (Signed)
[employer] PA in progress.   Key: Orrin Brigham

## 2021-07-02 NOTE — Telephone Encounter (Signed)
Advised by [employer] to call in PA/[employer] [phone].  PA complete and will send fax with decision or call back to check on status after 24. Reference #[id number]

## 2021-07-05 ENCOUNTER — Ambulatory Visit: Payer: Medicare HMO | Admitting: Family Medicine

## 2021-07-07 NOTE — Telephone Encounter (Signed)
Called to check status. Coverage denied/excluded from Medicare and pt informed.

## 2021-07-23 ENCOUNTER — Encounter: Payer: Self-pay | Admitting: Family Medicine

## 2021-07-23 DIAGNOSIS — S86912A Strain of unspecified muscle(s) and tendon(s) at lower leg level, left leg, initial encounter: Secondary | ICD-10-CM

## 2021-07-24 ENCOUNTER — Other Ambulatory Visit: Payer: Self-pay | Admitting: Family Medicine

## 2021-07-28 MED ORDER — PREDNISONE 10 MG PO TABS: ORAL_TABLET | ORAL | 0 refills | Status: DC

## 2021-07-30 ENCOUNTER — Ambulatory Visit (INDEPENDENT_AMBULATORY_CARE_PROVIDER_SITE_OTHER): Payer: Medicare HMO | Admitting: Psychology

## 2021-07-30 DIAGNOSIS — F3289 Other specified depressive episodes: Secondary | ICD-10-CM

## 2021-07-30 NOTE — Progress Notes (Signed)
[facility] Counselor/Therapist Progress Note  Patient ID: Sharon Bryan, MRN: [id number]    Date: 07/30/21  Time Spent: 3:04 pm - 3:57 pm : 53 Minutes  Treatment Type: Individual Therapy.  Reported Symptoms: Sadness, anxiety, rumination.    Mental Status Exam:  Appearance:  Neat and Well Groomed     Behavior: Appropriate  Motor: Normal  Speech/Language:  Clear and Coherent and Normal Rate  Affect: Congruent  Mood: normal  Thought process: normal  Thought content:   WNL  Sensory/Perceptual disturbances:   WNL  Orientation: oriented to person, place, time/date, and situation  Attention: Good  Concentration: Good  Memory: [place] of knowledge:  Good  Insight:   Good  Judgment:  Good  Impulse Control: Good    Physical Aggression / Violence:No  Access to Firearms a concern: Yes  Gang Involvement:No   Subjective:   Sharon Bryan participated from home, via video, and consented to treatment. Therapist participated from home office. We met online due to [place] pandemic. Sharon Bryan reviewed the events of the past week. Sharon Bryan noted disturbed sleep and nightmares. She noted her dog, Sharon Bryan, having cancer and not doing well. She noted having to miss the family reunion as a result and noted feeling sad. We worked on processing this during the session. We explored this during the session and her feelings of guilt. She noted a need to "fix things and make everyone happy".  She noted her self-talk "you should feel that way, that's being selfish" and attributed this to her mother. "Mom wanted to help everyone else but me" and noted her mother sending her to live with other family members. She note not being included with family outings and noted that her mother and father would often travel with her brother, who was 73 years older, but not [name]. She noted feeling physical tension, during the session, in relation to her brother and his verbal "finger pointing". We  began exploring this, during the session, and will do so going forward. Sharon Bryan was engaged and motivated and expressed commitment towards our goals. Therapist encouraged self-care and provided supportive therapy.We will discuss her family's lack of emotional intelligence going forward.   Interventions: Cognitive Behavioral Therapy and family systems  Diagnosis: Other depression  Treatment Plan:  Client Abilities/Strengths July is forthcoming and motivated for change.   Client Treatment Preferences Outpatient Therapy.   Client Statement of Needs Sharon Bryan discussed her goals for treatment including engagement in previously enjoyable activities, managing her overall mood and symptoms, improving her sleep, reduce rumination, improving self-talk, and process past events (including traumatic events).   Treatment Level Weekly  Symptoms Depression: Loss of interest, feeling depressed, lethargy, overeating, feeling bad about self, trouble concentrating, denied SI (hx w/o hospitalization). (Status: improved) Anxiety: Feeling anxious, difficulty managing worrying, worry about different things, difficulty relaxing, restlessness, irritability, feeling bad something awful might happen. (Status: improved)  Goals:   Sharon Bryan experiences symptoms of anxiety with depressive symptoms.    Target Date: 09/17/21 Frequency: Weekly  Progress: 20 Modality: individual    Therapist will provide referrals for additional resources as appropriate.  Therapist will provide psycho-education regarding Sharon Bryan's diagnosis and corresponding treatment approaches and interventions. Sharon Bryan will employ CBT, BA, Problem-solving, Solution Focused, Mindfulness, and coping skills to help manage decrease symptoms associated with her diagnosis.   Reduce overall level, frequency, and intensity of the feelings of depression & anxiety as evidenced by a decrease in symptoms of depression and anxiety  from 6 to 7 days/week  to 0 to 1 days/week per client report for at least 3 consecutive months. Verbally express understanding of the relationship between feelings of depression, anxiety and their impact on thinking patterns and behaviors. Verbalize an understanding of the role that distorted thinking plays in creating fears, excessive worry, and ruminations.  Sharon Bryan participated in the creation of the treatment plan)   [name], LCSW

## 2021-08-03 ENCOUNTER — Telehealth: Payer: Self-pay

## 2021-08-03 ENCOUNTER — Telehealth: Payer: Medicare HMO | Admitting: Family Medicine

## 2021-08-03 NOTE — Progress Notes (Signed)
I,Roshena L Chambers,acting as a scribe for Lelon Huh, MD.,have documented all relevant documentation on the behalf of Lelon Huh, MD,as directed by  Lelon Huh, MD while in the presence of Lelon Huh, MD.   Established patient visit   Patient: Sharon Bryan   DOB: 05-Jun-1965   56 y.o. Female  MRN: [id number] Visit Date: 08/04/2021  Today's healthcare provider: Lelon Huh, MD   Chief Complaint  Patient presents with   Dysuria   Subjective    Dysuria  This is a new problem. Episode onset: 3 days ago. The problem has been gradually worsening. The quality of the pain is described as burning. Associated symptoms include frequency and urgency. Pertinent negatives include no chills, nausea or vomiting. Treatments tried: AZO. The treatment provided no relief.     Medications: Outpatient Medications Prior to Visit  Medication Sig   ALPRAZolam (XANAX) 1 MG tablet TAKE 1/2 TO 1 TABLET BY MOUTH EVERY 8 HOURS AS NEEDED   amitriptyline (ELAVIL) 25 MG tablet TAKE 2 TO 3 TABLETS(50 TO 75 MG) BY MOUTH AT BEDTIME   Aspirin-Acetaminophen-Caffeine (EXCEDRIN PO) Take 1 tablet daily as needed by mouth.   busPIRone (BUSPAR) 30 MG tablet TAKE 1 TABLET BY MOUTH TWICE DAILY   estradiol (ESTRACE) 2 MG tablet TAKE 1 TABLET(2 MG) BY MOUTH DAILY   lamoTRIgine (LAMICTAL) 100 MG tablet TAKE 1 TABLET BY MOUTH TWICE DAILY   levothyroxine (SYNTHROID) 75 MCG tablet TAKE 1 TABLET(75 MCG) BY MOUTH DAILY   LYRICA 225 MG capsule TAKE 1 CAPSULE BY MOUTH TWO TIMES DAILY   Magnesium Oxide 420 MG TABS Take 400 mg by mouth daily.   meloxicam (MOBIC) 15 MG tablet TAKE 1 TABLET(15 MG) BY MOUTH DAILY AS NEEDED FOR PAIN   Multiple Vitamins-Minerals (CENTRUM SILVER PO) Take by mouth.   neomycin-polymyxin-hydrocortisone (CORTISPORIN) 3.5-10000-1 OTIC suspension SHAKE LIQUID AND INSTILL 3 TO 4 DROPS TO AFFECTED EAR FOUR TIMES DAILY   omeprazole (PRILOSEC) 20 MG capsule TAKE 1 CAPSULE BY MOUTH EVERY DAY    PARoxetine (PAXIL) 30 MG tablet TAKE 2 TABLETS(60 MG) BY MOUTH DAILY   phenytoin (DILANTIN) 100 MG ER capsule TAKE 2 CAPSULES(200 MG) BY MOUTH TWICE DAILY   promethazine (PHENERGAN) 25 MG tablet Take 1 tablet (25 mg total) by mouth every 8 (eight) hours as needed for nausea or vomiting.   propranolol (INDERAL) 10 MG tablet Take 1 tablet (10 mg total) by mouth at bedtime.   Semaglutide-Weight Management (WEGOVY) 0.25 MG/0.5ML SOAJ Inject 0.25 mg into the skin once a week.   buPROPion (WELLBUTRIN XL) 150 MG 24 hr tablet TAKE 1 TABLET(150 MG) BY MOUTH EVERY MORNING   [DISCONTINUED] predniSONE (DELTASONE) 10 MG tablet 6 tablets for 2 days, then 5 for 2 days, then 4 for 2 days, then 3 for 2 days, then 2 for 2 days, then 1 for 2 days. (Patient not taking: Reported on 08/04/2021)   No facility-administered medications prior to visit.    Review of Systems  Constitutional:  Negative for appetite change, chills, fatigue and fever.  Respiratory:  Negative for chest tightness and shortness of breath.   Cardiovascular:  Negative for chest pain and palpitations.  Gastrointestinal:  Negative for abdominal pain, nausea and vomiting.  Genitourinary:  Positive for dysuria, frequency and urgency.  Neurological:  Negative for dizziness and weakness.       Objective    BP 114/67 (BP Location: Left Arm, Patient Position: Sitting, Cuff Size: Large)   Pulse 67  Temp 98.3 F (36.8 C) (Oral)   Resp 16   Wt 267 lb (121.1 kg)   LMP 04/26/2002 (LMP Unknown)   SpO2 98% Comment: room air  BMI 48.83 kg/m      Results for orders placed or performed in visit on 08/04/21  POCT urinalysis dipstick  Result Value Ref Range   Color, UA yellow    Clarity, UA clear    Glucose, UA Negative Negative   Bilirubin, UA small    Ketones, UA negatove    Spec Grav, UA 1.010 1.010 - 1.025   Blood, UA trace (non hemolyzed)    pH, UA 7.5 5.0 - 8.0   Protein, UA Negative Negative   Urobilinogen, UA 0.2 0.2 or 1.0  E.U./dL   Nitrite, UA negative    Leukocytes, UA Moderate (2+) (A) Negative   Appearance     Odor      Assessment & Plan     1. Dysuria   2. Urinary tract infection with hematuria, site unspecified  - ciprofloxacin (CIPRO) 500 MG tablet; Take 1 tablet (500 mg total) by mouth 2 (two) times daily for 10 days.  Dispense: 20 tablet; Refill: 0 - Urine Culture       The entirety of the information documented in the History of Present Illness, Review of Systems and Physical Exam were personally obtained by me. Portions of this information were initially documented by the CMA and reviewed by me for thoroughness and accuracy.     Lelon Huh, MD  [facility] [phone] (phone) [phone] (fax)  [place]

## 2021-08-03 NOTE — Telephone Encounter (Signed)
Patient requesting to schedule appointment for possible UTI. Appointment scheduled.

## 2021-08-03 NOTE — Telephone Encounter (Signed)
Copied from [place] [id number]. Topic: General - Other >> Aug 03, 2021  9:34 AM Cyndi Bender wrote: Reason for CRM: Pt requests that Roshena return her call asap. Cb# [phone]

## 2021-08-04 ENCOUNTER — Encounter: Payer: Self-pay | Admitting: Family Medicine

## 2021-08-04 ENCOUNTER — Ambulatory Visit (INDEPENDENT_AMBULATORY_CARE_PROVIDER_SITE_OTHER): Payer: Medicare HMO | Admitting: Family Medicine

## 2021-08-04 VITALS — BP 114/67 | HR 67 | Temp 98.3°F | Resp 16 | Wt 267.0 lb

## 2021-08-04 DIAGNOSIS — N390 Urinary tract infection, site not specified: Secondary | ICD-10-CM | POA: Diagnosis not present

## 2021-08-04 DIAGNOSIS — R319 Hematuria, unspecified: Secondary | ICD-10-CM

## 2021-08-04 DIAGNOSIS — R300 Dysuria: Secondary | ICD-10-CM | POA: Diagnosis not present

## 2021-08-04 LAB — POCT URINALYSIS DIPSTICK
Glucose, UA: NEGATIVE
Ketones, UA: NEGATIVE
Nitrite, UA: NEGATIVE
Protein, UA: NEGATIVE
Spec Grav, UA: 1.01 (ref 1.010–1.025)
Urobilinogen, UA: 0.2 E.U./dL
pH, UA: 7.5 (ref 5.0–8.0)

## 2021-08-04 MED ORDER — CIPROFLOXACIN HCL 500 MG PO TABS: 500.0000 mg | ORAL_TABLET | Freq: Two times a day (BID) | ORAL | 0 refills | Status: AC

## 2021-08-11 ENCOUNTER — Ambulatory Visit (INDEPENDENT_AMBULATORY_CARE_PROVIDER_SITE_OTHER): Payer: Medicare HMO | Admitting: Psychology

## 2021-08-11 DIAGNOSIS — F3289 Other specified depressive episodes: Secondary | ICD-10-CM

## 2021-08-11 LAB — URINE CULTURE
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
## 2021-08-24 ENCOUNTER — Ambulatory Visit: Payer: Self-pay | Admitting: *Deleted

## 2021-08-24 ENCOUNTER — Other Ambulatory Visit: Payer: Self-pay | Admitting: Family Medicine

## 2021-08-24 DIAGNOSIS — G4700 Insomnia, unspecified: Secondary | ICD-10-CM

## 2021-08-24 DIAGNOSIS — N390 Urinary tract infection, site not specified: Secondary | ICD-10-CM

## 2021-08-24 MED ORDER — SULFAMETHOXAZOLE-TRIMETHOPRIM 800-160 MG PO TABS: 2.0000 | ORAL_TABLET | Freq: Two times a day (BID) | ORAL | 0 refills | Status: AC

## 2021-08-24 NOTE — Addendum Note (Signed)
Addended by: Birdie Sons on: 08/24/2021 02:38 PM   Modules accepted: Orders

## 2021-08-24 NOTE — Telephone Encounter (Signed)
  Chief Complaint: UTI symptoms have returned after completing course of Cipro.   Seen 08/04/2021 Symptoms: Burning, frequency, feeling the need to still urinate after urinating Frequency: Yesterday symptoms started back worse today Pertinent Negatives: Patient denies other symptoms Disposition: '[]'$ ED /'[]'$ Urgent Care (no appt availability in office) / '[]'$ Appointment(In office/virtual)/ '[]'$  [facility]/ '[]'$ Home Care/ '[]'$ Refused Recommended Disposition /'[]'$ [facility]/ '[x]'$  Follow-up with PCP Additional Notes: Urgent message sent to [facility] to Dr. Caryn Section.   Pt agreeable to having someone call her back.   She mentioned it took 2 rounds of Cipro 6 months ago to resolve a UTI.

## 2021-08-24 NOTE — Telephone Encounter (Signed)
Message from Loma Boston sent at 08/24/2021  9:54 AM EDT  Summary: pt wanting to talk to Dr Maralyn Sago nurse   Pls fu with pt/ states saw DR Caryn Section 2 weeks ago 7/12 and was given Cipro for UTI and med worked but now that is off the medication the UTI has came back, pt wanted to talk to the nurse before making appt, FU [phone]           Call History   Type Contact Phone/Fax User  08/24/2021 09:54 AM EDT Phone (Incoming) Sharon Bryan, Sharon Bryan (Self) [phone] Joslyn Devon   Reason for Disposition  [1] Painful urination AND [2] EITHER frequency or urgency AND [3] has on-call doctor    Seen 08/04/2021  Took Cipro, symptoms resolved but now back.  Answer Assessment - Initial Assessment Questions 1. SEVERITY: "How bad is the pain?"  (e.g., Scale 1-10; mild, moderate, or severe)   - MILD (1-3): complains slightly about urination hurting   - MODERATE (4-7): interferes with normal activities     - SEVERE (8-10): excruciating, unwilling or unable to urinate because of the pain      I was treated for the UTI by Dr. Caryn Section on 08/04/2021.   Given Cipro.   My symptoms went away but yesterday the burning started again and this morning the burning, frequency, still feeling like I have to go is back for sure.   Same symptoms I had before.   2. FREQUENCY: "How many times have you had painful urination today?"      Burning every time I urinate 3. PATTERN: "Is pain present every time you urinate or just sometimes?"      Every time today. 4. ONSET: "When did the painful urination start?"      Yesterday mild burning but today my symptoms and burning are back like before. 5. FEVER: "Do you have a fever?" If Yes, ask: "What is your temperature, how was it measured, and when did it start?"     Not asked 6. PAST UTI: "Have you had a urine infection before?" If Yes, ask: "When was the last time?" and "What happened that time?"      Yes about 6 months ago.   It took 2 rounds of Cipro then to get  rid of it. 7. CAUSE: "What do you think is causing the painful urination?"  (e.g., UTI, scratch, Herpes sore)     UTI Dr. Caryn Section diagnosed. 8. OTHER SYMPTOMS: "Do you have any other symptoms?" (e.g., blood in urine, flank pain, genital sores, urgency, vaginal discharge)     Frequency, Feeling like I still need to go even after I finish urinating. 9. PREGNANCY: "Is there any chance you are pregnant?" "When was your last menstrual period?"     Not asked due to age  Protocols used: Urination Pain - [facility]

## 2021-08-24 NOTE — Telephone Encounter (Addendum)
Cultures show uti is sensitive to sulfa antibiotic. Have sent prescription for bactrim to [facility].

## 2021-08-24 NOTE — Telephone Encounter (Signed)
Patient advised.

## 2021-08-28 ENCOUNTER — Other Ambulatory Visit: Payer: Self-pay | Admitting: Family Medicine

## 2021-08-28 DIAGNOSIS — F411 Generalized anxiety disorder: Secondary | ICD-10-CM

## 2021-09-02 ENCOUNTER — Ambulatory Visit (INDEPENDENT_AMBULATORY_CARE_PROVIDER_SITE_OTHER): Payer: Medicare HMO | Admitting: Psychology

## 2021-09-02 DIAGNOSIS — F3289 Other specified depressive episodes: Secondary | ICD-10-CM

## 2021-09-02 NOTE — Progress Notes (Signed)
[facility] Counselor/Therapist Progress Note  Patient ID: Sharon Bryan, MRN: [id number]    Date: 09/02/21  Time Spent: 3:06 pm - 3:48 pm : 42 Minutes  Treatment Type: Individual Therapy.  Reported Symptoms: Sadness, anxiety, rumination.    Mental Status Exam:  Appearance:  Neat and Well Groomed     Behavior: Appropriate  Motor: Normal  Speech/Language:  Clear and Coherent and Normal Rate  Affect: Congruent  Mood: normal  Thought process: normal  Thought content:   WNL  Sensory/Perceptual disturbances:   WNL  Orientation: oriented to person, place, time/date, and situation  Attention: Good  Concentration: Good  Memory: [place] of knowledge:  Good  Insight:   Good  Judgment:  Good  Impulse Control: Good    Physical Aggression / Violence:No  Access to Firearms a concern: Yes  Gang Involvement:No   Subjective:   [name] participated from home, via video, and consented to treatment. Therapist participated from home office. We met online due to [place] pandemic. Suad reviewed the events of the past week. [name] continues to struggle with her health and noted a recent kidney infection and noted feeling tired. She noted frequently disturbed sleep, insomnia and middle insomnia, and vivid dreams. She noted her attempts to reconcile this to no avail. She noted losing her religion after her husband left and her children chose not to speak to her prior to reconciling later. She noted feeling depressed at that point. She noted pretending to be happy when she was not. She noted using Tarot cards and noted that helping. She noted feeling depressed sometime after this and the subsequent loss of her dog. She noted difficulty enjoying things even when happy. She noted starting to pray again, during the past several months. We continued to process these events, in the past, and the effect of this on Saidi. We will continue to process this going forward.  [name] was engaged and motivated and expressed commitment towards our goals. Therapist encouraged self-care and provided supportive therapy.  Interventions: Cognitive Behavioral Therapy  Diagnosis: Other depression  Treatment Plan:  Client Abilities/Strengths Adore is forthcoming and motivated for change.   Client Treatment Preferences Outpatient Therapy.   Client Statement of Needs Tea discussed her goals for treatment including engagement in previously enjoyable activities, managing her overall mood and symptoms, improving her sleep, reduce rumination, improving self-talk, and process past events (including traumatic events).   Treatment Level Weekly  Symptoms Depression: Loss of interest, feeling depressed, lethargy, overeating, feeling bad about self, trouble concentrating, denied SI (hx w/o hospitalization). (Status: maintained) Anxiety: Feeling anxious, difficulty managing worrying, worry about different things, difficulty relaxing, restlessness, irritability, feeling bad something awful might happen. (Status: maintained)  Goals:   Lorana experiences symptoms of anxiety with depressive symptoms.    Target Date: 09/17/21 Frequency: Weekly  Progress: 20 Modality: individual    Therapist will provide referrals for additional resources as appropriate.  Therapist will provide psycho-education regarding Akyah's diagnosis and corresponding treatment approaches and interventions. Ladine will employ CBT, BA, Problem-solving, Solution Focused, Mindfulness, and coping skills to help manage decrease symptoms associated with her diagnosis.   Reduce overall level, frequency, and intensity of the feelings of depression & anxiety as evidenced by a decrease in symptoms of depression and anxiety  from 6 to 7 days/week to 0 to 1 days/week per client report for at least 3 consecutive months. Verbally express understanding of the relationship between feelings of depression,  anxiety and their impact on thinking  patterns and behaviors. Verbalize an understanding of the role that distorted thinking plays in creating fears, excessive worry, and ruminations.  [name] participated in the creation of the treatment plan)   [name], LCSW

## 2021-09-13 ENCOUNTER — Other Ambulatory Visit: Payer: Self-pay | Admitting: Family Medicine

## 2021-09-21 ENCOUNTER — Ambulatory Visit (INDEPENDENT_AMBULATORY_CARE_PROVIDER_SITE_OTHER): Payer: Medicare HMO | Admitting: Psychology

## 2021-09-21 DIAGNOSIS — F3289 Other specified depressive episodes: Secondary | ICD-10-CM | POA: Diagnosis not present

## 2021-09-21 NOTE — Progress Notes (Signed)
[facility] Counselor/Therapist Progress Note  Patient ID: ARLISS HEPBURN, MRN: [id number]    Date: 09/21/21  Time Spent: 9:06 pm - 10:00 pm : 58 Minutes  Treatment Type: Individual Therapy.  Reported Symptoms: Sadness, anxiety, rumination.    Mental Status Exam:  Appearance:  Neat and Well Groomed     Behavior: Appropriate  Motor: Normal  Speech/Language:  Clear and Coherent and Normal Rate  Affect: Congruent  Mood: depressed  Thought process: normal  Thought content:   WNL  Sensory/Perceptual disturbances:   WNL  Orientation: oriented to person, place, time/date, and situation  Attention: Good  Concentration: Good  Memory: [place] of knowledge:  Good  Insight:   Good  Judgment:  Good  Impulse Control: Good    Physical Aggression / Violence:No  Access to Firearms a concern: Yes  Gang Involvement:No   Subjective:   [name] participated from home, via video, and consented to treatment. Therapist participated from home office. We met online due to [place] pandemic. Saavi reviewed the events of the past week. [name] noted her niece Claiborne Billings) was recently murdered in her own home. [name] noted her niece having a long history of substance use. She noted a lack of support and empathy from her family. She noted feeling alone as a result. We worked on identifying the type of support she needs and ways to communicate this going forward to others. She noted this highlighting differences in her relationships and differences between her and others including her husband. We discussed communicating needs directly and assertively, setting boundaries for others, and working on identifying a support system more inline with her needs. Therapist validated and normalized [name]'s feelings and experience. We explored her feelings of grief. Therapist encouraged [name] to consider couples counseling. Therapist encouraged self-care and provided supportive  therapy.  Interventions: Grief Therapy and Interpersonal  Diagnosis: Other depression  Treatment Plan:  Client Abilities/Strengths Ahmya is forthcoming and motivated for change.   Client Treatment Preferences Outpatient Therapy.   Client Statement of Needs Twilia discussed her goals for treatment including engagement in previously enjoyable activities, managing her overall mood and symptoms, improving her sleep, reduce rumination, improving self-talk, and process past events (including traumatic events).   Treatment Level Weekly  Symptoms Depression: Loss of interest, feeling depressed, lethargy, overeating, feeling bad about self, trouble concentrating, denied SI (hx w/o hospitalization). (Status: maintained) Anxiety: Feeling anxious, difficulty managing worrying, worry about different things, difficulty relaxing, restlessness, irritability, feeling bad something awful might happen. (Status: maintained)  Goals:   Caty experiences symptoms of anxiety with depressive symptoms.    Target Date: 10/18/21 Frequency: Weekly  Progress: 20 Modality: individual    Therapist will provide referrals for additional resources as appropriate.  Therapist will provide psycho-education regarding Vittoria's diagnosis and corresponding treatment approaches and interventions. Addalyne will employ CBT, BA, Problem-solving, Solution Focused, Mindfulness, and coping skills to help manage decrease symptoms associated with her diagnosis.   Reduce overall level, frequency, and intensity of the feelings of depression & anxiety as evidenced by a decrease in symptoms of depression and anxiety  from 6 to 7 days/week to 0 to 1 days/week per client report for at least 3 consecutive months. Verbally express understanding of the relationship between feelings of depression, anxiety and their impact on thinking patterns and behaviors. Verbalize an understanding of the role that distorted thinking plays in  creating fears, excessive worry, and ruminations.  [name] participated in the creation of the treatment plan)   Alene Mires  Daryl Quiros, LCSW

## 2021-10-04 ENCOUNTER — Ambulatory Visit (INDEPENDENT_AMBULATORY_CARE_PROVIDER_SITE_OTHER): Payer: Medicare HMO | Admitting: Psychology

## 2021-10-04 DIAGNOSIS — F3289 Other specified depressive episodes: Secondary | ICD-10-CM

## 2021-10-04 NOTE — Progress Notes (Signed)
[facility] Counselor/Therapist Progress Note  Patient ID: Sharon Bryan, MRN: [id number]    Date: 10/04/21  Time Spent: 1:32 pm - 2:23 pm : 51 Minutes  Treatment Type: Individual Therapy.  Reported Symptoms: Sadness, anxiety, rumination.    Mental Status Exam:  Appearance:  Neat and Well Groomed     Behavior: Appropriate  Motor: Normal  Speech/Language:  Clear and Coherent and Normal Rate  Affect: Congruent  Mood: depressed  Thought process: normal  Thought content:   WNL  Sensory/Perceptual disturbances:   WNL  Orientation: oriented to person, place, time/date, and situation  Attention: Good  Concentration: Good  Memory: [place] of knowledge:  Good  Insight:   Good  Judgment:  Good  Impulse Control: Good    Physical Aggression / Violence:No  Access to Firearms a concern: Yes  Gang Involvement:No   Subjective:   Sharon Bryan participated from home, via video, and consented to treatment. Therapist participated from home office. We met online due to [place] pandemic. Sharon Bryan reviewed the events of the past week. Sharon Bryan noted setting boundaries with others regarding her recently deceased niece and this going well, overall. She noted continued grief regarding her niece and the strain in the family. She noted her dog's ailing health and noted worry that he will pass soon.  She noted feelings of guilt and pressure regarding this decision.  She noted that her husband had asked her to make this decision when the time comes in her discomfort with this.  We processed this during the session and worked on identifying ways to communicate needs and concerns with her husband.  Therapist modeled this during the session.  Therapist encouraged Sharon Bryan to engage in self-care, set boundaries regarding worry, and think more positively adaptively.  Therapist encouraged Sharon Bryan to focus on what she has done for her pet as opposed to what she could have done for her  parent.  Sharon Bryan was engaged and motivated during the session.  She expressed commitment towards her goals.  Therapist praised Sharon Bryan and provided supportive therapy.  Sharon Bryan was scheduled for follow-up and continues to benefit from treatment.   Interventions: Grief Therapy and Interpersonal  Diagnosis: Other depression  Treatment Plan:  Client Abilities/Strengths Sharon Bryan is forthcoming and motivated for change.   Client Treatment Preferences Outpatient Therapy.   Client Statement of Needs Sharon Bryan discussed her goals for treatment including engagement in previously enjoyable activities, managing her overall mood and symptoms, improving her sleep, reduce rumination, improving self-talk, and process past events (including traumatic events).   Treatment Level Weekly  Symptoms Depression: Loss of interest, feeling depressed, lethargy, overeating, feeling bad about self, trouble concentrating, denied SI (hx w/o hospitalization). (Status: declined) Anxiety: Feeling anxious, difficulty managing worrying, worry about different things, difficulty relaxing, restlessness, irritability, feeling bad something awful might happen. (Status: maintained)  Goals:   Sharon Bryan experiences symptoms of anxiety with depressive symptoms.    Target Date: 10/18/21 Frequency: Weekly  Progress: 20 Modality: individual    Therapist will provide referrals for additional resources as appropriate.  Therapist will provide psycho-education regarding Kiah's diagnosis and corresponding treatment approaches and interventions. Walter will employ CBT, BA, Problem-solving, Solution Focused, Mindfulness, and coping skills to help manage decrease symptoms associated with her diagnosis.   Reduce overall level, frequency, and intensity of the feelings of depression & anxiety as evidenced by a decrease in symptoms of depression and anxiety  from 6 to 7 days/week to 0 to 1 days/week per client report for at  least 3  consecutive months. Verbally express understanding of the relationship between feelings of depression, anxiety and their impact on thinking patterns and behaviors. Verbalize an understanding of the role that distorted thinking plays in creating fears, excessive worry, and ruminations.  Sharon Bryan participated in the creation of the treatment plan)   [name], LCSW

## 2021-10-05 ENCOUNTER — Telehealth: Payer: Self-pay

## 2021-10-05 NOTE — Telephone Encounter (Signed)
Pt called, LVMTCB to schedule flu shot.

## 2021-10-05 NOTE — Telephone Encounter (Signed)
Called to schedule patient for nurse visit to get flu shot. LMTCB. Okay for PEC to schedule.

## 2021-10-18 ENCOUNTER — Ambulatory Visit (INDEPENDENT_AMBULATORY_CARE_PROVIDER_SITE_OTHER): Payer: Medicare HMO | Admitting: Psychology

## 2021-10-18 DIAGNOSIS — F3289 Other specified depressive episodes: Secondary | ICD-10-CM

## 2021-10-18 NOTE — Progress Notes (Signed)
Sharon Bryan Counselor/Therapist Progress Note  Patient ID: Sharon Bryan, MRN: [id number]    Date: 10/18/21  Time Spent: 4:06 pm - 4:53 pm : 47 Minutes  Treatment Type: Individual Therapy.  Reported Symptoms: Sadness, anxiety, rumination.    Mental Status Exam:  Appearance:  Neat and Well Groomed     Behavior: Appropriate  Motor: Normal  Speech/Language:  Clear and Coherent and Normal Rate  Affect: Congruent  Mood: normal  Thought process: normal  Thought content:   WNL  Sensory/Perceptual disturbances:   WNL  Orientation: oriented to person, place, time/date, and situation  Attention: Good  Concentration: Good  Memory: [place] of knowledge:  Good  Insight:   Good  Judgment:  Good  Impulse Control: Good    Physical Aggression / Violence:No  Access to Firearms a concern: Yes  Gang Involvement:No   Subjective:   Sharon Bryan participated from home, via video, and consented to treatment. Therapist participated from home office. We met online due to [place] pandemic. Sharon Bryan reviewed the events of the past week. Sharon Bryan noted her effort to communicate her needs to her husband regarding increased support regarding their ailing dog. She noted receiving support from her husband. She noted deciding to euthanize him due to inoperable cancer. She processed this during the session and the loss of her pet. Therapist praised Sharon Bryan for her efforts in this area. She noted her dreams improving and overall improved sleep. She noted her work on managing her stressors and thinking positively and using humor to manage stress. She noted engaging in enjoyable activities, specifically reading, which she finds enjoyable. Therapist encouraged continued effort in this area. Therapist praised Sharon Bryan and provided supportive therapy.  Sharon Bryan was scheduled for follow-up to complete her annual reassessment. She continues to benefit from treatment.   Interventions: Grief  Therapy and Interpersonal  Diagnosis: Other depression  Treatment Plan:  Client Abilities/Strengths Sharon Bryan is forthcoming and motivated for change.   Client Treatment Preferences Outpatient Therapy.   Client Statement of Needs Sharon Bryan discussed her goals for treatment including engagement in previously enjoyable activities, managing her overall mood and symptoms, improving her sleep, reduce rumination, improving self-talk, and process past events (including traumatic events).   Treatment Level Weekly  Symptoms Depression: Loss of interest, feeling depressed, lethargy, overeating, feeling bad about self, trouble concentrating, denied SI (hx w/o hospitalization). (Status: declined) Anxiety: Feeling anxious, difficulty managing worrying, worry about different things, difficulty relaxing, restlessness, irritability, feeling bad something awful might happen. (Status: maintained)  Goals:   Sharon Bryan experiences symptoms of anxiety with depressive symptoms.    Target Date:11/17/21 Frequency: Weekly  Progress: 20 Modality: individual    Therapist will provide referrals for additional resources as appropriate.  Therapist will provide psycho-education regarding Navy's diagnosis and corresponding treatment approaches and interventions. Sharon Bryan will employ CBT, BA, Problem-solving, Solution Focused, Mindfulness, and coping skills to help manage decrease symptoms associated with her diagnosis.   Reduce overall level, frequency, and intensity of the feelings of depression & anxiety as evidenced by a decrease in symptoms of depression and anxiety  from 6 to 7 days/week to 0 to 1 days/week per client report for at least 3 consecutive months. Verbally express understanding of the relationship between feelings of depression, anxiety and their impact on thinking patterns and behaviors. Verbalize an understanding of the role that distorted thinking plays in creating fears, excessive worry, and  ruminations.  Sharon Bryan participated in the creation of the treatment plan)   [name], LCSW

## 2021-10-19 ENCOUNTER — Encounter: Payer: Self-pay | Admitting: Family Medicine

## 2021-10-19 ENCOUNTER — Ambulatory Visit (INDEPENDENT_AMBULATORY_CARE_PROVIDER_SITE_OTHER): Payer: Medicare HMO | Admitting: Family Medicine

## 2021-10-19 VITALS — BP 102/64 | HR 74 | Ht 63.0 in | Wt 258.0 lb

## 2021-10-19 DIAGNOSIS — R300 Dysuria: Secondary | ICD-10-CM | POA: Diagnosis not present

## 2021-10-19 DIAGNOSIS — Z91038 Other insect allergy status: Secondary | ICD-10-CM | POA: Diagnosis not present

## 2021-10-19 DIAGNOSIS — D509 Iron deficiency anemia, unspecified: Secondary | ICD-10-CM

## 2021-10-19 DIAGNOSIS — G40909 Epilepsy, unspecified, not intractable, without status epilepticus: Secondary | ICD-10-CM | POA: Diagnosis not present

## 2021-10-19 DIAGNOSIS — Z23 Encounter for immunization: Secondary | ICD-10-CM | POA: Diagnosis not present

## 2021-10-19 DIAGNOSIS — F411 Generalized anxiety disorder: Secondary | ICD-10-CM

## 2021-10-19 DIAGNOSIS — N390 Urinary tract infection, site not specified: Secondary | ICD-10-CM

## 2021-10-19 DIAGNOSIS — K580 Irritable bowel syndrome with diarrhea: Secondary | ICD-10-CM | POA: Diagnosis not present

## 2021-10-19 DIAGNOSIS — E049 Nontoxic goiter, unspecified: Secondary | ICD-10-CM

## 2021-10-19 DIAGNOSIS — M7042 Prepatellar bursitis, left knee: Secondary | ICD-10-CM

## 2021-10-19 DIAGNOSIS — M7041 Prepatellar bursitis, right knee: Secondary | ICD-10-CM

## 2021-10-19 LAB — POCT URINALYSIS DIPSTICK
Blood, UA: NEGATIVE
Glucose, UA: NEGATIVE
Ketones, UA: NEGATIVE
Leukocytes, UA: NEGATIVE
Nitrite, UA: POSITIVE
Protein, UA: POSITIVE — AB
Spec Grav, UA: 1.01 (ref 1.010–1.025)
Urobilinogen, UA: 0.2 E.U./dL
pH, UA: 5 (ref 5.0–8.0)

## 2021-10-19 MED ORDER — RIFAXIMIN 550 MG PO TABS: 550.0000 mg | ORAL_TABLET | Freq: Three times a day (TID) | ORAL | 0 refills | Status: AC

## 2021-10-19 MED ORDER — CEPHALEXIN 500 MG PO CAPS: 500.0000 mg | ORAL_CAPSULE | Freq: Three times a day (TID) | ORAL | 0 refills | Status: AC

## 2021-10-19 NOTE — Patient Instructions (Signed)
Please review the attached list of medications and notify my office if there are any errors.   Please bring all of your medications to every appointment so we can make sure that our medication list is the same as yours.   Start Xifaxin '550mg'$  one tablet three times a day. If you tolerate to samples well, then fill the prescription and take until gone.

## 2021-10-19 NOTE — Progress Notes (Unsigned)
I,[name],acting as a scribe for Lelon Huh, MD.,have documented all relevant documentation on the behalf of Lelon Huh, MD,as directed by  Lelon Huh, MD while in the presence of Lelon Huh, MD.   Established patient visit   Patient: Sharon Bryan   DOB: 26-Dec-1965   56 y.o. Female  MRN: [id number] Visit Date: 10/19/2021  Today's healthcare provider: Lelon Huh, MD   Chief Complaint  Patient presents with   Dysuria    Patient complains of burning and frequency for 5 days.    Diarrhea    Patient complains of a current flare-up of IBS.    Insect Bite    Patient states she got stung by a hornet yesterday. L thigh is swollen.    Subjective    HPI She reports onset of burning and urinary frequent 5 days ago. No fevers chills or sweats. No unusual back pains.   Also reports having diarrhea every time she eats particular food especially certain vegetables. Has been ongoing for years but more persistent recently and occurring nearly every day.   Also reports having been bit by hornet yesterday in right upper leg which turned red, itchy and swollen. She took Benadryl yesterday and reports redness and itchy have greatly improved since then.   She is also due for follow up chronic medication problems including hypothyroid, anxiety, depression, and seizure disorder. Is doing well on all of her current medications. No seizures for several years on current medications.    Medications: Outpatient Medications Prior to Visit  Medication Sig   ALPRAZolam (XANAX) 1 MG tablet TAKE 1/2 TO 1 TABLET BY MOUTH EVERY 8 HOURS AS NEEDED   amitriptyline (ELAVIL) 25 MG tablet TAKE 2 TO 3 TABLETS(50 TO 75 MG) BY MOUTH AT BEDTIME   Aspirin-Acetaminophen-Caffeine (EXCEDRIN PO) Take 1 tablet daily as needed by mouth.   busPIRone (BUSPAR) 30 MG tablet TAKE 1 TABLET BY MOUTH TWICE DAILY   estradiol (ESTRACE) 2 MG tablet TAKE 1 TABLET(2 MG) BY MOUTH DAILY   lamoTRIgine  (LAMICTAL) 100 MG tablet TAKE 1 TABLET BY MOUTH TWICE DAILY   levothyroxine (SYNTHROID) 75 MCG tablet TAKE 1 TABLET(75 MCG) BY MOUTH DAILY   LYRICA 225 MG capsule TAKE 1 CAPSULE BY MOUTH TWO TIMES DAILY   Magnesium Oxide 420 MG TABS Take 400 mg by mouth daily.   meloxicam (MOBIC) 15 MG tablet TAKE 1 TABLET(15 MG) BY MOUTH DAILY AS NEEDED FOR PAIN   Multiple Vitamins-Minerals (CENTRUM SILVER PO) Take by mouth.   neomycin-polymyxin-hydrocortisone (CORTISPORIN) 3.5-10000-1 OTIC suspension SHAKE LIQUID AND INSTILL 3 TO 4 DROPS TO AFFECTED EAR FOUR TIMES DAILY   omeprazole (PRILOSEC) 20 MG capsule TAKE 1 CAPSULE BY MOUTH EVERY DAY   PARoxetine (PAXIL) 30 MG tablet TAKE 2 TABLETS(60 MG) BY MOUTH DAILY   phenytoin (DILANTIN) 100 MG ER capsule TAKE 2 CAPSULES(200 MG) BY MOUTH TWICE DAILY   propranolol (INDERAL) 10 MG tablet TAKE 1 TABLET(10 MG) BY MOUTH AT BEDTIME   promethazine (PHENERGAN) 25 MG tablet Take 1 tablet (25 mg total) by mouth every 8 (eight) hours as needed for nausea or vomiting. (Patient not taking: Reported on 10/19/2021)   No facility-administered medications prior to visit.    Review of Systems  Constitutional:  Negative for appetite change, chills, fatigue and fever.  Respiratory:  Negative for chest tightness and shortness of breath.   Cardiovascular:  Negative for chest pain and palpitations.  Gastrointestinal:  Negative for abdominal pain, nausea and vomiting.  Neurological:  Negative for dizziness and weakness.       Objective    BP 102/64 (BP Location: Left Arm, Patient Position: Sitting, Cuff Size: Normal)   Pulse 74   Ht '5\' 3"'$  (1.6 m)   Wt 258 lb (117 kg)   LMP 04/26/2002 (LMP Unknown)   BMI 45.70 kg/m    Physical Exam   General appearance: Obese female, cooperative and in no acute distress Head: Normocephalic, without obvious abnormality, atraumatic Respiratory: Respirations even and unlabored, normal respiratory rate Extremities: All extremities are  intact.  Skin: Faint erythema and slight swelling left upper thigh around insect bit.  Psych: Appropriate mood and affect. Neurologic: Mental status: Alert, oriented to person, place, and time, thought content appropriate.   Results for orders placed or performed in visit on 10/19/21  POCT Urinalysis Dipstick  Result Value Ref Range   Color, UA yellow    Clarity, UA clear    Glucose, UA Negative Negative   Bilirubin, UA small    Ketones, UA negative    Spec Grav, UA 1.010 1.010 - 1.025   Blood, UA negative    pH, UA 5.0 5.0 - 8.0   Protein, UA Positive (A) Negative   Urobilinogen, UA 0.2 0.2 or 1.0 E.U./dL   Nitrite, UA positive    Leukocytes, UA Negative Negative   Appearance     Odor      Assessment & Plan     1. Dysuria  - Urine Culture  2. Urinary tract infection without hematuria, site unspecified  - cephALEXin (KEFLEX) 500 MG capsule; Take 1 capsule (500 mg total) by mouth 3 (three) times daily for 7 days.  Dispense: 21 capsule; Refill: 0  3. Seizure disorder ([place]) Very well controlled on current medications.  - CBC - Comprehensive metabolic panel - Dilantin (Phenytoin) level, total  4. Irritable bowel syndrome with diarrhea Given 12 tablets and prescription for 30 tablets rifaximin (XIFAXAN) 550 MG TABS tablet; Take 1 tablet (550 mg total) by mouth 3 (three) times daily for 10 days.   5. Generalized anxiety disorder Well controlled on current dose of paroxetine.   6. Iron deficiency anemia, unspecified iron deficiency anemia type - CBC  7. Goiter On levothyroxine.  - T4, free - TSH  8. Allergic reaction to insect bite.  Much improved with use of OTC benadryl. No sign of infection. May continue OTC antihistamines as needed until reaction completely resolved.   9. Need for influenza vaccination  - Flu Vaccine QUAD 6+ mos PF IM (Fluarix Quad PF)   Addressed extensive list of chronic and acute medical problems today requiring 40 minutes reviewing her  medical record, counseling patient regarding her conditions and coordination of care.        The entirety of the information documented in the History of Present Illness, Review of Systems and Physical Exam were personally obtained by me. Portions of this information were initially documented by the CMA and reviewed by me for thoroughness and accuracy.     Lelon Huh, MD  [facility] [phone] (phone) [phone] (fax)  [place]

## 2021-10-20 ENCOUNTER — Encounter: Payer: Self-pay | Admitting: Family Medicine

## 2021-10-20 LAB — COMPREHENSIVE METABOLIC PANEL
ALT: 27 IU/L (ref 0–32)
AST: 21 IU/L (ref 0–40)
Albumin/Globulin Ratio: 1.7 (ref 1.2–2.2)
Albumin: 4.4 g/dL (ref 3.8–4.9)
Alkaline Phosphatase: 89 IU/L (ref 44–121)
BUN/Creatinine Ratio: 19 (ref 9–23)
BUN: 15 mg/dL (ref 6–24)
Bilirubin Total: 0.3 mg/dL (ref 0.0–1.2)
CO2: 17 mmol/L — ABNORMAL LOW (ref 20–29)
Calcium: 9.6 mg/dL (ref 8.7–10.2)
Chloride: 104 mmol/L (ref 96–106)
Creatinine, Ser: 0.77 mg/dL (ref 0.57–1.00)
Globulin, Total: 2.6 g/dL (ref 1.5–4.5)
Glucose: 87 mg/dL (ref 70–99)
Potassium: 4.3 mmol/L (ref 3.5–5.2)
Sodium: 142 mmol/L (ref 134–144)
Total Protein: 7 g/dL (ref 6.0–8.5)
eGFR: 92 mL/min/{1.73_m2} (ref >59)

## 2021-10-20 LAB — T4, FREE: Free T4: 1.1 ng/dL (ref 0.82–1.77)

## 2021-10-20 LAB — CBC
Hematocrit: 37.9 % (ref 34.0–46.6)
Hemoglobin: 12.8 g/dL (ref 11.1–15.9)
MCH: 31.4 pg (ref 26.6–33.0)
MCHC: 33.8 g/dL (ref 31.5–35.7)
MCV: 93 fL (ref 79–97)
Platelets: 262 10*3/uL (ref 150–450)
RBC: 4.08 x10E6/uL (ref 3.77–5.28)
RDW: 11.9 % (ref 11.7–15.4)
WBC: 8.4 10*3/uL (ref 3.4–10.8)

## 2021-10-20 LAB — PHENYTOIN LEVEL, TOTAL: Phenytoin (Dilantin), Serum: 5.6 ug/mL — ABNORMAL LOW (ref 10.0–20.0)

## 2021-10-20 LAB — TSH: TSH: 2.46 u[IU]/mL (ref 0.450–4.500)

## 2021-10-20 MED ORDER — NABUMETONE 750 MG PO TABS: 750.0000 mg | ORAL_TABLET | Freq: Every day | ORAL | 3 refills | Status: DC | PRN

## 2021-10-20 NOTE — Addendum Note (Signed)
Addended by: Birdie Sons on: 10/20/2021 08:36 PM   Modules accepted: Level of Service

## 2021-10-20 NOTE — Addendum Note (Signed)
Addended by: Birdie Sons on: 10/20/2021 09:28 PM   Modules accepted: Orders

## 2021-10-21 LAB — SPECIMEN STATUS REPORT

## 2021-10-21 LAB — URINE CULTURE

## 2021-10-30 ENCOUNTER — Encounter: Payer: Self-pay | Admitting: Family Medicine

## 2021-11-01 ENCOUNTER — Encounter: Payer: Medicare HMO | Admitting: Psychology

## 2021-11-01 NOTE — Progress Notes (Signed)
This encounter was created in error - please disregard.

## 2021-11-02 ENCOUNTER — Encounter: Payer: Self-pay | Admitting: Family Medicine

## 2021-11-02 DIAGNOSIS — M7041 Prepatellar bursitis, right knee: Secondary | ICD-10-CM

## 2021-11-02 MED ORDER — NABUMETONE 750 MG PO TABS: 750.0000 mg | ORAL_TABLET | Freq: Every day | ORAL | 3 refills | Status: DC | PRN

## 2021-11-05 ENCOUNTER — Other Ambulatory Visit: Payer: Self-pay | Admitting: Family Medicine

## 2021-11-10 ENCOUNTER — Other Ambulatory Visit: Payer: Self-pay | Admitting: Family Medicine

## 2021-11-10 ENCOUNTER — Ambulatory Visit: Payer: Self-pay

## 2021-11-10 DIAGNOSIS — G40909 Epilepsy, unspecified, not intractable, without status epilepticus: Secondary | ICD-10-CM

## 2021-11-10 NOTE — Telephone Encounter (Signed)
[facility] pharmacy called back, spoke with [place], [facility]. They are needing to know if ok to switch manufacture for phenytoin so they can fill medication. Advised that I would send to provider for review and have them call back.   Summary: med question   Shelby from [facility] called in asking if its ok to change manufacturers because this is a NTI therapeutic drug.      Reason for Disposition  [1] Pharmacy calling with prescription question AND [2] triager unable to answer question  Answer Assessment - Initial Assessment Questions 1. NAME of MEDICINE: "What medicine(s) are you calling about?"     phenytoin (DILANTIN) 100 MG ER capsule 2. QUESTION: "What is your question?" (e.g., double dose of medicine, side effect)     Needing to change manufacturer to fill  3. PRESCRIBER: "Who prescribed the medicine?" Reason: if prescribed by specialist, call should be referred to that group.     Dr. Caryn Section  Protocols used: Medication Question Call-A-AH

## 2021-11-10 NOTE — Telephone Encounter (Signed)
Ok to change manufacturers of phenytoin

## 2021-11-11 NOTE — Telephone Encounter (Signed)
Pharmacy was advised.

## 2021-11-16 ENCOUNTER — Ambulatory Visit (INDEPENDENT_AMBULATORY_CARE_PROVIDER_SITE_OTHER): Payer: Medicare HMO | Admitting: Psychology

## 2021-11-16 DIAGNOSIS — F3289 Other specified depressive episodes: Secondary | ICD-10-CM | POA: Diagnosis not present
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
## 2021-11-18 ENCOUNTER — Ambulatory Visit: Payer: Medicare HMO | Admitting: Dermatology

## 2021-11-28 ENCOUNTER — Other Ambulatory Visit: Payer: Self-pay | Admitting: Family Medicine

## 2021-12-09 ENCOUNTER — Ambulatory Visit (INDEPENDENT_AMBULATORY_CARE_PROVIDER_SITE_OTHER): Payer: Medicare HMO | Admitting: Psychology

## 2021-12-09 DIAGNOSIS — F3289 Other specified depressive episodes: Secondary | ICD-10-CM

## 2021-12-09 NOTE — Progress Notes (Signed)
MyChart Video Visit    Virtual Visit via Video Note   This format is felt to be most appropriate for this patient at this time. Physical exam was limited by quality of the video and audio technology used for the visit.   Patient location: home Provider location: [facility]  I discussed the limitations of evaluation and management by telemedicine and the availability of in person appointments. The patient expressed understanding and agreed to proceed.  Patient: Sharon Bryan   DOB: [birth year]   56 y.o. Female  MRN: [id number] Visit Date: 12/10/2021  Today's healthcare provider: Lelon Huh, MD   Chief Complaint  Patient presents with   Diarrhea   Depression   Subjective    HPI  Diarrhea/ Loose stools: Patient reports having problems with loose stools after eating red meat. This has occured for several years, but has worsened over the past couple of weeks. She is concerned about having possible Alpha -Gal syndrome requests labs.  Took xifaxin and meat      12/10/2021    8:27 AM 11/16/2021    2:35 PM 10/19/2021    3:20 PM  Depression screen PHQ 2/9  Decreased Interest 3  1  Down, Depressed, Hopeless 3  0  PHQ - 2 Score 6  1  Altered sleeping 2  1  Tired, decreased energy 3  3  Change in appetite 1  2  Feeling bad or failure about yourself  3  0  Trouble concentrating 3  0  Moving slowly or fidgety/restless 1  0  Suicidal thoughts 0  0  PHQ-9 Score 19  7  Difficult doing work/chores Extremely dIfficult  Extremely dIfficult     Information is confidential and restricted. Go to Review Flowsheets to unlock data.    Has also been having more trouble with depression and anxiety lately. Poor motivations, doesn't feel like doing anything. Seems to have started earl in the summer when she started.    Sees therapist every two weeks, next follow is December 7th.   Medications: Outpatient Medications Prior to Visit  Medication Sig   ALPRAZolam (XANAX) 1 MG tablet  TAKE 1/2 TO 1 TABLET BY MOUTH EVERY 8 HOURS AS NEEDED   amitriptyline (ELAVIL) 25 MG tablet TAKE 2 TO 3 TABLETS(50 TO 75 MG) BY MOUTH AT BEDTIME   Aspirin-Acetaminophen-Caffeine (EXCEDRIN PO) Take 1 tablet daily as needed by mouth.   busPIRone (BUSPAR) 30 MG tablet TAKE 1 TABLET BY MOUTH TWICE DAILY   estradiol (ESTRACE) 2 MG tablet TAKE 1 TABLET(2 MG) BY MOUTH DAILY   lamoTRIgine (LAMICTAL) 100 MG tablet TAKE 1 TABLET BY MOUTH TWICE DAILY   levothyroxine (SYNTHROID) 75 MCG tablet TAKE 1 TABLET(75 MCG) BY MOUTH DAILY   LYRICA 225 MG capsule TAKE 1 CAPSULE BY MOUTH TWO TIMES DAILY   Magnesium Oxide 420 MG TABS Take 400 mg by mouth daily.   Multiple Vitamins-Minerals (CENTRUM SILVER PO) Take by mouth.   nabumetone (RELAFEN) 750 MG tablet Take 1-2 tablets (750-1,500 mg total) by mouth daily as needed (leg and knee pain).   neomycin-polymyxin-hydrocortisone (CORTISPORIN) 3.5-10000-1 OTIC suspension SHAKE LIQUID AND INSTILL 3 TO 4 DROPS TO AFFECTED EAR FOUR TIMES DAILY   omeprazole (PRILOSEC) 20 MG capsule TAKE 1 CAPSULE BY MOUTH EVERY DAY   PARoxetine (PAXIL) 30 MG tablet TAKE 2 TABLETS(60 MG) BY MOUTH DAILY   phenytoin (DILANTIN) 100 MG ER capsule TAKE 2 CAPSULES(200 MG) BY MOUTH TWICE DAILY   propranolol (INDERAL) 10 MG tablet TAKE  1 TABLET(10 MG) BY MOUTH AT BEDTIME   No facility-administered medications prior to visit.    Review of Systems  Constitutional:  Negative for appetite change, chills, fatigue and fever.  Respiratory:  Negative for chest tightness and shortness of breath.   Cardiovascular:  Negative for chest pain and palpitations.  Gastrointestinal:  Positive for diarrhea and nausea. Negative for abdominal pain and vomiting.  Neurological:  Negative for dizziness and weakness.  Psychiatric/Behavioral:  Positive for dysphoric mood.        Objective    LMP 04/26/2002 (LMP Unknown)      Physical Exam   Awake, alert, oriented x 3. In no apparent distress    Assessment  & Plan     1. Diarrhea,unspecified type Previously triggered by any food, but resolved for several weeks after taking Xifaxin. Sx now returned but only triggered by red meat and pork.   - Alpha-Gal Panel - Lipase  2. Generalized anxiety disorder  3. Other depression  Has been much worse for the last several months. Seeing counselor about every 2 weeks which she finds helpful.   Will wean off paroxetine and start '20mg'$  duloxetine with titration up to '60mg'$  daily over the next 3 weeks. Follow up 4 weeks.   4. Chronic pain of both knees Seems to be exacerbated depression and anxiety. Hopefully duloxetine will help with this.       I discussed the assessment and treatment plan with the patient. The patient was provided an opportunity to ask questions and all were answered. The patient agreed with the plan and demonstrated an understanding of the instructions.   The patient was advised to call back or seek an in-person evaluation if the symptoms worsen or if the condition fails to improve as anticipated.  I provided 15 minutes of non-face-to-face time during this encounter.  The entirety of the information documented in the History of Present Illness, Review of Systems and Physical Exam were personally obtained by me. Portions of this information were initially documented by the CMA and reviewed by me for thoroughness and accuracy.    Lelon Huh, MD [facility] [phone] (phone) [phone] (fax)  [place]

## 2021-12-09 NOTE — Progress Notes (Signed)
[facility] Counselor/Therapist Progress Note  Patient ID: Sharon Bryan, MRN: [id number]   Date: 12/09/21  Time Spent: 2:04  pm - 2:58 pm : 54 Minutes  Treatment Type: Individual Therapy.  Reported Symptoms: depression and anxiety.   Mental Status Exam: Appearance:  Casual     Behavior: Appropriate  Motor: Normal  Speech/Language:  Normal Rate  Affect: Depressed  Mood: normal  Thought process: normal  Thought content:   WNL  Sensory/Perceptual disturbances:   WNL  Orientation: oriented to person, place, time/date, and situation  Attention: Good  Concentration: Good  Memory: WNL  Fund of knowledge:  Good  Insight:   Good  Judgment:  Good  Impulse Control: Good   Risk Assessment: Danger to Self:  No Self-injurious Behavior: No Danger to Others: No Duty to Warn:no Physical Aggression / Violence:No  Access to Firearms a concern: No  Gang Involvement:No   Subjective:   Sharon Bryan participated from home, via video and consented to treatment. Therapist participated from home office. We met online due to [place] pandemic. Sharon Bryan reviewed the events of the past week. We reviewed numerous treatment approaches including CBT, BA, Problem Solving, and Solution focused therapy. Psych-education regarding the Sharon Bryan's diagnosis of Other depression was provided during the session. We discussed Sharon Bryan goals treatment goals which include enjoyable activities, managing her overall mood and symptoms, improving self-talk, resolve relationship stressors, improve communication and process past events.Sharon Bryan provided verbal approval of the treatment plan.   Interventions: Psycho-education & Goal Setting.   Diagnosis:  Other depression  Psychiatric Treatment: Yes , via PCP Dr. Caryn Section.   Treatment Plan:  Client Abilities/Strengths Sharon Bryan is self-ware, motivated for change, and flexible.   Support System: Husband and  family.   Client Treatment Preferences Outpatient Therapy.   Client Statement of Needs Sharon Bryan would like to engage in enjoyable activities, managing her overall mood and symptoms, improving self-talk, resolve relationship stressors, improve communication and process past events.  Treatment Level Weekly  Symptoms  Depression: loss of interest, feeling down, middle insomnia, lethargy, poor appetite, feeling bad about self, difficulty concentrating,  psycho-motor agitation, no SI.     (Status: maintained) Anxiety: Anxious, difficulty managing worry, worrying about different things, restlessness, easily annoyed, feeling afraid as if something bad might happen.    (Status: maintained)  Goals:   Sharon Bryan experiences symptoms of depression and anxiety.    Target Date: 12/10/22 Frequency: Weekly  Progress: 0 Modality: individual    Therapist will provide referrals for additional resources as appropriate.  Therapist will provide psycho-education regarding Sharon Bryan's diagnosis and corresponding treatment approaches and interventions. Licensed Clinical Social Worker, [place], LCSW will support the patient's ability to achieve the goals identified. will employ CBT, BA, Problem-solving, Solution Focused, Mindfulness,  coping skills, & other evidenced-based practices will be used to promote progress towards healthy functioning to help manage decrease symptoms associated with her diagnosis.   Reduce overall level, frequency, and intensity of the feelings of depression, anxiety and panic evidenced by decreased overall symptoms from 6 to 7 days/week to 0 to 1 days/week per client report for at least 3 consecutive months. Verbally express understanding of the relationship between feelings of depression, anxiety and their impact on thinking patterns and behaviors. Verbalize an understanding of the role that distorted thinking plays in creating fears, excessive worry, and  ruminations.   Sharon Bryan participated in the creation of the treatment plan)   [name], LCSW

## 2021-12-10 ENCOUNTER — Telehealth (INDEPENDENT_AMBULATORY_CARE_PROVIDER_SITE_OTHER): Payer: Medicare HMO | Admitting: Family Medicine

## 2021-12-10 ENCOUNTER — Encounter: Payer: Self-pay | Admitting: Family Medicine

## 2021-12-10 DIAGNOSIS — F411 Generalized anxiety disorder: Secondary | ICD-10-CM | POA: Diagnosis not present

## 2021-12-10 DIAGNOSIS — F3289 Other specified depressive episodes: Secondary | ICD-10-CM

## 2021-12-10 DIAGNOSIS — G8929 Other chronic pain: Secondary | ICD-10-CM

## 2021-12-10 DIAGNOSIS — M25562 Pain in left knee: Secondary | ICD-10-CM | POA: Diagnosis not present

## 2021-12-10 DIAGNOSIS — R197 Diarrhea, unspecified: Secondary | ICD-10-CM

## 2021-12-10 DIAGNOSIS — M25561 Pain in right knee: Secondary | ICD-10-CM

## 2021-12-10 MED ORDER — DULOXETINE HCL 20 MG PO CPEP: 20.0000 mg | ORAL_CAPSULE | Freq: Every day | ORAL | 1 refills | Status: DC

## 2021-12-10 MED ORDER — DULOXETINE HCL 20 MG PO CPEP: ORAL_CAPSULE | ORAL | Status: DC

## 2021-12-10 MED ORDER — PAROXETINE HCL 30 MG PO TABS: ORAL_TABLET | ORAL | Status: DC

## 2021-12-10 NOTE — Patient Instructions (Addendum)
Start taking 1 duloxetine (Cymbalta) once a day and reduce paroxetine to 1 tablet once a day for 1 week.   Then increase duloxetine to two tablets once a day and reduce paroxetine to 1/2 tablet once a day for 1 week  Then increase duloxetine to three tablets once a day and stop paroxetine

## 2021-12-25 ENCOUNTER — Other Ambulatory Visit: Payer: Self-pay | Admitting: Family Medicine

## 2021-12-27 ENCOUNTER — Other Ambulatory Visit: Payer: Self-pay | Admitting: Family Medicine

## 2021-12-27 DIAGNOSIS — G4700 Insomnia, unspecified: Secondary | ICD-10-CM

## 2021-12-27 NOTE — Telephone Encounter (Signed)
Requested Prescriptions  Pending Prescriptions Disp Refills   amitriptyline (ELAVIL) 25 MG tablet [Pharmacy Med Name: AMITRIPTYLINE '25MG'$  TABLETS] 90 tablet 0    Sig: TAKE 2 TO 3 TABLETS(50 TO 75 MG) BY MOUTH AT BEDTIME     Psychiatry:  Antidepressants - Heterocyclics (TCAs) Passed - 12/27/2021  3:31 AM      Passed - Completed PHQ-2 or PHQ-9 in the last 360 days      Passed - Valid encounter within last 6 months    Recent Outpatient Visits           2 weeks ago Diarrhea, unspecified type   [facility] Birdie Sons, MD   2 months ago [place], Donald E, MD   4 months ago [place], Donald E, MD   6 months ago Strain of both knees, initial encounter   [facility] Birdie Sons, MD   9 months ago Diarrhea, unspecified type   [facility] Birdie Sons, MD       Future Appointments             In 2 days Fisher, Kirstie Peri, MD [facility], [place]   In 1 week Caryn Section, Kirstie Peri, MD [facility], [place]   In 3 months Ralene Bathe, MD [place]

## 2021-12-29 ENCOUNTER — Telehealth (INDEPENDENT_AMBULATORY_CARE_PROVIDER_SITE_OTHER): Payer: Medicare HMO | Admitting: Family Medicine

## 2021-12-29 DIAGNOSIS — F3289 Other specified depressive episodes: Secondary | ICD-10-CM | POA: Diagnosis not present

## 2021-12-29 DIAGNOSIS — F411 Generalized anxiety disorder: Secondary | ICD-10-CM

## 2021-12-29 MED ORDER — DULOXETINE HCL 20 MG PO CPEP: 20.0000 mg | ORAL_CAPSULE | Freq: Every day | ORAL | Status: DC

## 2021-12-29 MED ORDER — PAROXETINE HCL 30 MG PO TABS: 30.0000 mg | ORAL_TABLET | Freq: Every day | ORAL | Status: DC

## 2021-12-29 NOTE — Progress Notes (Signed)
I,Joseline E Rosas,acting as a scribe for Lelon Huh, MD.,have documented all relevant documentation on the behalf of Lelon Huh, MD,as directed by  Lelon Huh, MD while in the presence of Lelon Huh, MD.  MyChart Video Visit    Virtual Visit via Video Note   This format is felt to be most appropriate for this patient at this time. Physical exam was limited by quality of the video and audio technology used for the visit.   Patient location: Home  Provider location: [facility]  I discussed the limitations of evaluation and management by telemedicine and the availability of in person appointments. The patient expressed understanding and agreed to proceed.  Patient: Sharon Bryan   DOB: 1965-08-06   56 y.o. Female  MRN: [id number] Visit Date: 12/29/2021  Today's healthcare provider: Lelon Huh, MD   Chief Complaint  Patient presents with   Anxiety   Subjective    HPI  Anxiety, Follow-up  She was last seen for anxiety 2 weeks ago. Changes made at last visit include wean off paroxetine and start '20mg'$  duloxetine with titration up to '60mg'$  daily    She reports excellent compliance with treatment. She reports excellent tolerance of treatment. She is not having side effects.   She feels her anxiety is severe and Worse since last visit. States she felt much better for a few weeks until she discontinued the paroxetine when anxiety became much worse.   Symptoms: No chest pain Yes difficulty concentrating  Yes dizziness Yes fatigue  Yes feelings of losing control Yes insomnia  Yes irritable No palpitations  No panic attacks No racing thoughts  No shortness of breath No sweating  No tremors/shakes    GAD-7 Results    12/29/2021   11:42 AM 11/16/2021    2:32 PM 03/03/2020    8:03 AM  GAD-7 Generalized Anxiety Disorder Screening Tool  1. Feeling Nervous, Anxious, or on Edge 3  3  2. Not Being Able to Stop or Control Worrying 0  1  3. Worrying Too Much About  Different Things 0  1  4. Trouble Relaxing 2  1  5. Being So Restless it's Hard To Sit Still 1  0  6. Becoming Easily Annoyed or Irritable 3  3  7. Feeling Afraid As If Something Awful Might Happen 0  1  Total GAD-7 Score 9  10  Difficulty At Work, Home, or Getting  Along With Others? Extremely difficult  Somewhat difficult     Information is confidential and restricted. Go to Review Flowsheets to unlock data.    PHQ-9 Scores    12/10/2021    8:27 AM 11/16/2021    2:35 PM 10/19/2021    3:20 PM  PHQ9 SCORE ONLY  PHQ-9 Total Score 19  7     Information is confidential and restricted. Go to Review Flowsheets to unlock data.    ---------------------------------------------------------------------------------------------------    Medications: Outpatient Medications Prior to Visit  Medication Sig   ALPRAZolam (XANAX) 1 MG tablet TAKE 1/2 TO 1 TABLET BY MOUTH EVERY 8 HOURS AS NEEDED   amitriptyline (ELAVIL) 25 MG tablet TAKE 2 TO 3 TABLETS(50 TO 75 MG) BY MOUTH AT BEDTIME   Aspirin-Acetaminophen-Caffeine (EXCEDRIN PO) Take 1 tablet daily as needed by mouth.   busPIRone (BUSPAR) 30 MG tablet TAKE 1 TABLET BY MOUTH TWICE DAILY   DULoxetine (CYMBALTA) 20 MG capsule Start by taking 1 tablet daily for 1 week, then 2 tablets daily for 1 week, then 3 tablets  daily   estradiol (ESTRACE) 2 MG tablet TAKE 1 TABLET(2 MG) BY MOUTH DAILY   lamoTRIgine (LAMICTAL) 100 MG tablet TAKE 1 TABLET BY MOUTH TWICE DAILY   levothyroxine (SYNTHROID) 75 MCG tablet TAKE 1 TABLET(75 MCG) BY MOUTH DAILY   LYRICA 225 MG capsule TAKE 1 CAPSULE BY MOUTH TWO TIMES DAILY   Magnesium Oxide 420 MG TABS Take 400 mg by mouth daily.   Multiple Vitamins-Minerals (CENTRUM SILVER PO) Take by mouth.   nabumetone (RELAFEN) 750 MG tablet Take 1-2 tablets (750-1,500 mg total) by mouth daily as needed (leg and knee pain).   neomycin-polymyxin-hydrocortisone (CORTISPORIN) 3.5-10000-1 OTIC suspension SHAKE LIQUID AND INSTILL 3 TO 4  DROPS TO AFFECTED EAR FOUR TIMES DAILY   omeprazole (PRILOSEC) 20 MG capsule TAKE 1 CAPSULE BY MOUTH EVERY DAY   phenytoin (DILANTIN) 100 MG ER capsule TAKE 2 CAPSULES(200 MG) BY MOUTH TWICE DAILY   propranolol (INDERAL) 10 MG tablet TAKE 1 TABLET(10 MG) BY MOUTH AT BEDTIME   PARoxetine (PAXIL) 30 MG tablet TAKE 2 TABLETS(60 MG) BY MOUTH DAILY   No facility-administered medications prior to visit.         Objective    LMP 04/26/2002 (LMP Unknown)     Physical Exam   Awake, alert, oriented x 3. In no apparent distress    Assessment & Plan     1. Other depression Significant improvement even with starting dose of duloxetine, but anxiety is much worse since weaning off of paroxetine as below. Will cut duloxetine back to 2 x '20mg'$  for a week and consider going down to 1 a day after that.    2. Generalized anxiety disorder Much worse since stopping paroxetine. Was doing well after reducing from 2 x 30 to 1 x '30mg'$  per day. Will restart at '30mg'$  along with lower dose of duloxetine.      I discussed the assessment and treatment plan with the patient. The patient was provided an opportunity to ask questions and all were answered. The patient agreed with the plan and demonstrated an understanding of the instructions.   The patient was advised to call back or seek an in-person evaluation if the symptoms worsen or if the condition fails to improve as anticipated.  I provided 12 minutes of non-face-to-face time during this encounter.  The entirety of the information documented in the History of Present Illness, Review of Systems and Physical Exam were personally obtained by me. Portions of this information were initially documented by the CMA and reviewed by me for thoroughness and accuracy.    Lelon Huh, MD [facility] [phone] (phone) [phone] (fax)  [place]

## 2021-12-30 ENCOUNTER — Ambulatory Visit (INDEPENDENT_AMBULATORY_CARE_PROVIDER_SITE_OTHER): Payer: Medicare HMO | Admitting: Psychology

## 2021-12-30 DIAGNOSIS — F3289 Other specified depressive episodes: Secondary | ICD-10-CM

## 2021-12-30 NOTE — Progress Notes (Signed)
[place] Counselor/Therapist Progress Note  Patient ID: GEORGIA DELSIGNORE, MRN: [id number]   Date: 12/30/21  Time Spent: 3:06  pm - 4:03  pm : 57 Minutes  Treatment Type: Individual Therapy.  Reported Symptoms: depression and anxiety.   Mental Status Exam: Appearance:  Casual     Behavior: Appropriate  Motor: Normal  Speech/Language:  Normal Rate  Affect: Depressed  Mood: anxious and depressed  Thought process: normal  Thought content:   WNL  Sensory/Perceptual disturbances:   WNL  Orientation: oriented to person, place, time/date, and situation  Attention: Good  Concentration: Good  Memory: WNL  Fund of knowledge:  Good  Insight:   Good  Judgment:  Good  Impulse Control: Good   Risk Assessment: Danger to Self:  No Self-injurious Behavior: No Danger to Others: No Duty to Warn:no Physical Aggression / Violence:No  Access to Firearms a concern: No  Gang Involvement:No   Subjective:   [name] participated from home, via phone and consented to treatment. Therapist participated from home office. We met online due to [place] pandemic. Shamir reviewed the events of the past week. She noted a medication change and is currently on Effoxor and Paxil. She noted previously titration off Paxil after being prescribed Effexor and noted an increase in anxiety. She noted endorsing panic, today, and noted this being due to aggravation at her significant other.  She noted begin triggered by her SO's lack of support and noted being reminded of childhood and lack of support. We explored her frustration during the session. We worked on setting a goal to identify needs in the relationship and reflect on her past attempts to address these concerns. Therapist reviewed coping and the importance of managing stress, engaging in relaxation, and proactively managing panic symptoms. Therapist validated and normalized [name]'s feelings. We discussed boundary setting with  her husband and therapist modeled assertive and direct communication. [name] was engaged and motivated and expressed commitment towards our goals. Therapist praised [name] and provided supportive therapy.   Interventions: Interpersonal   Diagnosis:  Other depression  Psychiatric Treatment: Yes , via PCP Dr. Caryn Section.   Treatment Plan:  Client Abilities/Strengths Bettejane is self-ware, motivated for change, and flexible.   Support System: Husband and family.   Client Treatment Preferences Outpatient Therapy.   Client Statement of Needs Jenasia would like to engage in enjoyable activities, managing her overall mood and symptoms, improving self-talk, resolve relationship stressors, improve communication and process past events.  Treatment Level Weekly  Symptoms  Depression: loss of interest, feeling down, middle insomnia, lethargy, poor appetite, feeling bad about self, difficulty concentrating,  psycho-motor agitation, no SI.     (Status: maintained) Anxiety: Anxious, difficulty managing worry, worrying about different things, restlessness, easily annoyed, feeling afraid as if something bad might happen.    (Status: declined)  Goals:   Aayra experiences symptoms of depression and anxiety.    Target Date: 01/09/23 Frequency: Weekly  Progress: 0 Modality: individual    Therapist will provide referrals for additional resources as appropriate.  Therapist will provide psycho-education regarding Mashal's diagnosis and corresponding treatment approaches and interventions. Licensed Clinical Social Worker, [place], LCSW will support the patient's ability to achieve the goals identified. will employ CBT, BA, Problem-solving, Solution Focused, Mindfulness,  coping skills, & other evidenced-based practices will be used to promote progress towards healthy functioning to help manage decrease symptoms associated with her diagnosis.   Reduce overall level, frequency, and intensity  of the feelings of depression, anxiety and  panic evidenced by decreased overall symptoms from 6 to 7 days/week to 0 to 1 days/week per client report for at least 3 consecutive months. Verbally express understanding of the relationship between feelings of depression, anxiety and their impact on thinking patterns and behaviors. Verbalize an understanding of the role that distorted thinking plays in creating fears, excessive worry, and ruminations.   [name] participated in the creation of the treatment plan)   [name], LCSW

## 2022-01-03 NOTE — Progress Notes (Unsigned)
MyChart Video Visit    Virtual Visit via Video Note   This format is felt to be most appropriate for this patient at this time. Physical exam was limited by quality of the video and audio technology used for the visit.   Patient location: home Provider location: [facility]  I discussed the limitations of evaluation and management by telemedicine and the availability of in person appointments. The patient expressed understanding and agreed to proceed.  Patient: Sharon Bryan   DOB: 1965-08-21   56 y.o. Female  MRN: [id number] Visit Date: 01/04/2022  Today's healthcare provider: Lelon Huh, MD    Subjective    HPI  Depression, Follow-up  She  was last seen for this 1 weeks ago. Changes made at last visit include reducing duloxetine back to 2 x '20mg'$  a day, and restarting paroxetine at '30mg'$  due to worsening anxiety and panic attacks. She feels her anxiety is better since starting back on paroxetine, but is still having some depression, which she feels was better when she was taking 3 x '20mg'$  duloxetine. She does continue to talk to therapist on a regular basis, has follow up tomorrow.       01/04/2022    8:30 AM 12/10/2021    8:27 AM 11/16/2021    2:35 PM  Depression screen PHQ 2/9  Decreased Interest 2 3   Down, Depressed, Hopeless 2 3   PHQ - 2 Score 4 6   Altered sleeping 2 2   Tired, decreased energy 3 3   Change in appetite 1 1   Feeling bad or failure about yourself  0 3   Trouble concentrating 2 3   Moving slowly or fidgety/restless 0 1   Suicidal thoughts 0 0   PHQ-9 Score 12 19   Difficult doing work/chores Very difficult Extremely dIfficult      Information is confidential and restricted. Go to Review Flowsheets to unlock data.    -----------------------------------------------------------------------------------------    Medications: Outpatient Medications Prior to Visit  Medication Sig   ALPRAZolam (XANAX) 1 MG tablet TAKE 1/2 TO 1 TABLET BY  MOUTH EVERY 8 HOURS AS NEEDED   amitriptyline (ELAVIL) 25 MG tablet TAKE 2 TO 3 TABLETS(50 TO 75 MG) BY MOUTH AT BEDTIME   Aspirin-Acetaminophen-Caffeine (EXCEDRIN PO) Take 1 tablet daily as needed by mouth.   busPIRone (BUSPAR) 30 MG tablet TAKE 1 TABLET BY MOUTH TWICE DAILY   DULoxetine (CYMBALTA) 20 MG capsule Take 1-2 capsules (20-40 mg total) by mouth daily.   estradiol (ESTRACE) 2 MG tablet TAKE 1 TABLET(2 MG) BY MOUTH DAILY   lamoTRIgine (LAMICTAL) 100 MG tablet TAKE 1 TABLET BY MOUTH TWICE DAILY   levothyroxine (SYNTHROID) 75 MCG tablet TAKE 1 TABLET(75 MCG) BY MOUTH DAILY   LYRICA 225 MG capsule TAKE 1 CAPSULE BY MOUTH TWO TIMES DAILY   Magnesium Oxide 420 MG TABS Take 400 mg by mouth daily.   Multiple Vitamins-Minerals (CENTRUM SILVER PO) Take by mouth.   nabumetone (RELAFEN) 750 MG tablet Take 1-2 tablets (750-1,500 mg total) by mouth daily as needed (leg and knee pain).   neomycin-polymyxin-hydrocortisone (CORTISPORIN) 3.5-10000-1 OTIC suspension SHAKE LIQUID AND INSTILL 3 TO 4 DROPS TO AFFECTED EAR FOUR TIMES DAILY   omeprazole (PRILOSEC) 20 MG capsule TAKE 1 CAPSULE BY MOUTH EVERY DAY   PARoxetine (PAXIL) 30 MG tablet Take 1 tablet (30 mg total) by mouth daily.   phenytoin (DILANTIN) 100 MG ER capsule TAKE 2 CAPSULES(200 MG) BY MOUTH TWICE DAILY  propranolol (INDERAL) 10 MG tablet TAKE 1 TABLET(10 MG) BY MOUTH AT BEDTIME   No facility-administered medications prior to visit.        Objective    LMP 04/26/2002 (LMP Unknown)      Physical Exam  Awake, alert, oriented x 3. In no apparent distress    Assessment & Plan     1. Other depression   2. Generalized anxiety disorder   3. Panic disorder  Now back on paroxetine '30mg'$  a day with some improvement in anxiety. Depression was significantly better on '60mg'$  duloxetine than the '40mg'$  she is taking now. She still has half a bottle of '20mg'$  duloxetine and will increase dose back to 3 x '20mg'$  per day. If tolerating  then anticipate refilling with '60mg'$  once a day before running out. Follow up therapist as scheduled.     I discussed the assessment and treatment plan with the patient. The patient was provided an opportunity to ask questions and all were answered. The patient agreed with the plan and demonstrated an understanding of the instructions.   The patient was advised to call back or seek an in-person evaluation if the symptoms worsen or if the condition fails to improve as anticipated.  I provided 9 minutes of non-face-to-face time during this encounter.  The entirety of the information documented in the History of Present Illness, Review of Systems and Physical Exam were personally obtained by me. Portions of this information were initially documented by the CMA and reviewed by me for thoroughness and accuracy.    Lelon Huh, MD [facility] [phone] (phone) [phone] (fax)  [place]

## 2022-01-04 ENCOUNTER — Telehealth (INDEPENDENT_AMBULATORY_CARE_PROVIDER_SITE_OTHER): Payer: Medicare HMO | Admitting: Family Medicine

## 2022-01-04 ENCOUNTER — Encounter: Payer: Self-pay | Admitting: Family Medicine

## 2022-01-04 DIAGNOSIS — F411 Generalized anxiety disorder: Secondary | ICD-10-CM

## 2022-01-04 DIAGNOSIS — F410 Panic disorder [episodic paroxysmal anxiety] without agoraphobia: Secondary | ICD-10-CM | POA: Diagnosis not present

## 2022-01-04 DIAGNOSIS — F3289 Other specified depressive episodes: Secondary | ICD-10-CM | POA: Diagnosis not present

## 2022-01-05 ENCOUNTER — Ambulatory Visit (INDEPENDENT_AMBULATORY_CARE_PROVIDER_SITE_OTHER): Payer: Medicare HMO | Admitting: Psychology

## 2022-01-05 DIAGNOSIS — F3289 Other specified depressive episodes: Secondary | ICD-10-CM | POA: Diagnosis not present

## 2022-01-05 NOTE — Progress Notes (Signed)
[place] Counselor/Therapist Progress Note  Patient ID: Sharon Bryan, MRN: [id number]   Date: 01/05/22  Time Spent: 9:06  am - 10:02 am : 58 Minutes  Treatment Type: Individual Therapy.  Reported Symptoms: depression and anxiety.   Mental Status Exam: Appearance:  Casual     Behavior: Appropriate  Motor: Normal  Speech/Language:  Normal Rate  Affect: Depressed  Mood: anxious and depressed  Thought process: normal  Thought content:   WNL  Sensory/Perceptual disturbances:   WNL  Orientation: oriented to person, place, time/date, and situation  Attention: Good  Concentration: Good  Memory: WNL  Fund of knowledge:  Good  Insight:   Good  Judgment:  Good  Impulse Control: Good   Risk Assessment: Danger to Self:  No Self-injurious Behavior: No Danger to Others: No Duty to Warn:no Physical Aggression / Violence:No  Access to Firearms a concern: No  Gang Involvement:No   Subjective:   Sharon Bryan participated from home, via video and consented to treatment. Therapist participated from home office. We met online due to [place] pandemic. Sharon Bryan reviewed the events of the past week. Sharon Bryan noted a shift in her medication and noted feeling better with this new change (reflected in chart). She noted some disturbed sleep which she she stated isn't abnormal for her, at times. She noted continued consideration of leaving the home and living in her daughter in-law's mother in-law suite. She noted relief having a tentative plan. She noted this month being stressful due to anniversaries of passing including her mother's. She denied any panic attack's since our last session. She noted a need to address her poor short-term memory but could not identify the cause of her poor short-term memory. Therapist provided psycho-education regarding the possible effects of mood, health, and interpersonal stressors on short-term memory. We discussed the importance of being  more purposeful and mindful day-to-day, which Sharon Bryan noted was a struggle. She noted being less present and aware during day-to-day tasks. We discussed mindfulness and being more present in day-to-day experiences and mindfulness exercises were provided during the session, via email, for reference and review. Sharon Bryan was engaged and motivated and expressed commitment towards our goals. Therapist praised Sharon Bryan and provided supportive therapy.   Interventions: Interpersonal   Diagnosis:  Other depression  Psychiatric Treatment: Yes , via PCP Dr. Caryn Section.   Treatment Plan:  Client Abilities/Strengths Sharon Bryan is self-ware, motivated for change, and flexible.   Support System: Husband and family.   Client Treatment Preferences Outpatient Therapy.   Client Statement of Needs Sharon Bryan would like to engage in enjoyable activities, managing her overall mood and symptoms, improving self-talk, resolve relationship stressors, improve communication and process past events.  Treatment Level Weekly  Symptoms  Depression: loss of interest, feeling down, middle insomnia, lethargy, poor appetite, feeling bad about self, difficulty concentrating,  psycho-motor agitation, no SI.     (Status: maintained) Anxiety: Anxious, difficulty managing worry, worrying about different things, restlessness, easily annoyed, feeling afraid as if something bad might happen.    (Status: maintained)  Goals:   Sharon Bryan experiences symptoms of depression and anxiety.    Target Date: 01/09/23 Frequency: Weekly  Progress: 0 Modality: individual    Therapist will provide referrals for additional resources as appropriate.  Therapist will provide psycho-education regarding Sharon Bryan's diagnosis and corresponding treatment approaches and interventions. Licensed Clinical Social Worker, [place], LCSW will support the patient's ability to achieve the goals identified. will employ CBT, BA, Problem-solving, Solution  Focused, Mindfulness,  coping skills, &  other evidenced-based practices will be used to promote progress towards healthy functioning to help manage decrease symptoms associated with her diagnosis.   Reduce overall level, frequency, and intensity of the feelings of depression, anxiety and panic evidenced by decreased overall symptoms from 6 to 7 days/week to 0 to 1 days/week per client report for at least 3 consecutive months. Verbally express understanding of the relationship between feelings of depression, anxiety and their impact on thinking patterns and behaviors. Verbalize an understanding of the role that distorted thinking plays in creating fears, excessive worry, and ruminations.   Sharon Bryan participated in the creation of the treatment plan)   [name], LCSW

## 2022-01-07 ENCOUNTER — Telehealth: Payer: Medicare HMO | Admitting: Family Medicine

## 2022-01-07 ENCOUNTER — Other Ambulatory Visit: Payer: Self-pay | Admitting: Family Medicine

## 2022-01-07 DIAGNOSIS — F3289 Other specified depressive episodes: Secondary | ICD-10-CM

## 2022-01-07 MED ORDER — DULOXETINE HCL 60 MG PO CPEP: 60.0000 mg | ORAL_CAPSULE | Freq: Every day | ORAL | 1 refills | Status: DC

## 2022-01-13 ENCOUNTER — Ambulatory Visit (INDEPENDENT_AMBULATORY_CARE_PROVIDER_SITE_OTHER): Payer: Medicare HMO | Admitting: Psychology

## 2022-01-13 DIAGNOSIS — F3289 Other specified depressive episodes: Secondary | ICD-10-CM | POA: Diagnosis not present
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
## 2022-01-14 ENCOUNTER — Encounter: Payer: Self-pay | Admitting: Family Medicine

## 2022-01-18 ENCOUNTER — Other Ambulatory Visit: Payer: Self-pay | Admitting: *Deleted

## 2022-01-18 DIAGNOSIS — F3289 Other specified depressive episodes: Secondary | ICD-10-CM

## 2022-01-18 MED ORDER — DULOXETINE HCL 60 MG PO CPEP: 60.0000 mg | ORAL_CAPSULE | Freq: Every day | ORAL | 1 refills | Status: DC

## 2022-01-22 ENCOUNTER — Other Ambulatory Visit: Payer: Self-pay | Admitting: Family Medicine

## 2022-01-22 DIAGNOSIS — G4700 Insomnia, unspecified: Secondary | ICD-10-CM

## 2022-01-25 NOTE — Telephone Encounter (Signed)
Requested Prescriptions  Pending Prescriptions Disp Refills   amitriptyline (ELAVIL) 25 MG tablet [Pharmacy Med Name: AMITRIPTYLINE '25MG'$  TABLETS] 90 tablet 0    Sig: TAKE 2 TO 3 TABLETS(50 TO 75 MG) BY MOUTH AT BEDTIME     Psychiatry:  Antidepressants - Heterocyclics (TCAs) Passed - 01/22/2022  5:04 PM      Passed - Completed PHQ-2 or PHQ-9 in the last 360 days      Passed - Valid encounter within last 6 months    Recent Outpatient Visits           3 weeks ago Other depression   [place], Kirstie Peri, MD   3 weeks ago Other depression   [facility] Birdie Sons, MD   1 month ago Diarrhea, unspecified type   [facility] Birdie Sons, MD   3 months ago [place], Donald E, MD   5 months ago [place], Donald E, MD       Future Appointments             In 2 months Ralene Bathe, MD [place]

## 2022-01-27 ENCOUNTER — Ambulatory Visit (INDEPENDENT_AMBULATORY_CARE_PROVIDER_SITE_OTHER): Payer: Medicare HMO | Admitting: Psychology

## 2022-01-27 DIAGNOSIS — F3289 Other specified depressive episodes: Secondary | ICD-10-CM

## 2022-01-27 NOTE — Progress Notes (Signed)
[facility] Counselor/Therapist Progress Note  Patient ID: Sharon Bryan, MRN: [id number]   Date: 01/27/22  Time Spent: 11:06  am - 11:56 am : 50 Minutes  Treatment Type: Individual Therapy.  Reported Symptoms: depression and anxiety.   Mental Status Exam: Appearance:  Casual     Behavior: Appropriate  Motor: Normal  Speech/Language:  Normal Rate  Affect: Depressed  Mood: anxious and depressed  Thought process: normal  Thought content:   WNL  Sensory/Perceptual disturbances:   WNL  Orientation: oriented to person, place, time/date, and situation  Attention: Good  Concentration: Good  Memory: WNL  Fund of knowledge:  Good  Insight:   Good  Judgment:  Good  Impulse Control: Good   Risk Assessment: Danger to Self:  No Self-injurious Behavior: No Danger to Others: No Duty to Warn:no Physical Aggression / Violence:No  Access to Firearms a concern: No  Gang Involvement:No   Subjective:   Sharon Bryan participated from home, via video and consented to treatment. Therapist participated from home office. We met online due to [place] pandemic. [place] reviewed the events of the past week. Sharon Bryan noted a disagreement with her SO who requested to aid with organizing her mediation and forgetting to fill a certain medication. She noted her SO not putting her Xanax medication in her organizer and the proceeding argument. She noted experiencing new found energy and clarity due to not taking her Xanax and noted a drive to engage in specific activities such as drawing and writing. Therapist encouraged Sharon Bryan to create a routine or schedule, tentatively, to begin engagement, setting reasonable achievable goals. Sharon Bryan was engaged and motivate during the session. She expressed commitment towards our goals. Therapist praised Sharon Bryan and provided supportive therapy. Sharon Bryan continues to benefit from treatment. Therapist provided supportive therapy.  Interventions:  Interpersonal   Diagnosis:  Other depression  Psychiatric Treatment: Yes , via PCP Dr. Caryn Section.   Treatment Plan:  Client Abilities/Strengths [name] is self-ware, motivated for change, and flexible.   Support System: Husband and family.   Client Treatment Preferences Outpatient Therapy.   Client Statement of Needs Sharlot would like to engage in enjoyable activities, managing her overall mood and symptoms, improving self-talk, resolve relationship stressors, improve communication and process past events.  Treatment Level Weekly  Symptoms  Depression: loss of interest, feeling down, middle insomnia, lethargy, poor appetite, feeling bad about self, difficulty concentrating,  psycho-motor agitation, no SI.     (Status: maintained) Anxiety: Anxious, difficulty managing worry, worrying about different things, restlessness, easily annoyed, feeling afraid as if something bad might happen.    (Status: maintained)  Goals:   Sharon Bryan experiences symptoms of depression and anxiety.    Target Date: 01/09/23 Frequency: Weekly  Progress: 0 Modality: individual    Therapist will provide referrals for additional resources as appropriate.  Therapist will provide psycho-education regarding Sharon Bryan's diagnosis and corresponding treatment approaches and interventions. Licensed Clinical Social Worker, [place], LCSW will support the patient's ability to achieve the goals identified. will employ CBT, BA, Problem-solving, Solution Focused, Mindfulness,  coping skills, & other evidenced-based practices will be used to promote progress towards healthy functioning to help manage decrease symptoms associated with her diagnosis.   Reduce overall level, frequency, and intensity of the feelings of depression, anxiety and panic evidenced by decreased overall symptoms from 6 to 7 days/week to 0 to 1 days/week per client report for at least 3 consecutive months. Verbally express understanding of the  relationship between feelings of depression, anxiety and  their impact on thinking patterns and behaviors. Verbalize an understanding of the role that distorted thinking plays in creating fears, excessive worry, and ruminations.   Sharon Bryan participated in the creation of the treatment plan)   [name], LCSW

## 2022-01-28 ENCOUNTER — Telehealth (INDEPENDENT_AMBULATORY_CARE_PROVIDER_SITE_OTHER): Payer: Medicare HMO | Admitting: Family Medicine

## 2022-01-28 DIAGNOSIS — F411 Generalized anxiety disorder: Secondary | ICD-10-CM | POA: Diagnosis not present

## 2022-01-28 DIAGNOSIS — J3089 Other allergic rhinitis: Secondary | ICD-10-CM | POA: Diagnosis not present

## 2022-01-28 MED ORDER — ALPRAZOLAM 1 MG PO TABS: ORAL_TABLET | ORAL | Status: DC

## 2022-01-28 MED ORDER — FLUTICASONE PROPIONATE 50 MCG/ACT NA SUSP: 2.0000 | Freq: Every day | NASAL | 6 refills | Status: DC

## 2022-01-28 NOTE — Progress Notes (Signed)
I,[name],acting as a Education administrator for Lelon Huh, MD.,have documented all relevant documentation on the behalf of Lelon Huh, MD,as directed by  Lelon Huh, MD while in the presence of Lelon Huh, MD.  MyChart Video Visit    Virtual Visit via Video Note   This format is felt to be most appropriate for this patient at this time. Physical exam was limited by quality of the video and audio technology used for the visit.   Patient location: home Provider location: [facility]  I discussed the limitations of evaluation and management by telemedicine and the availability of in person appointments. The patient expressed understanding and agreed to proceed.  Patient: Sharon Bryan   DOB: Oct 01, 1965   57 y.o. Female  MRN: [id number] Visit Date: 01/28/2022  Today's healthcare provider: Lelon Huh, MD   No chief complaint on file.  Subjective    HPI   Anxiety, Follow-up  She was last seen for anxiety 3 months ago. Changes made at last visit include continue current treatment.  States she accidentally stopped taking xanax for the last week. States was having severe anxiety for several days, but woke up yesterday and felt much better. She feels more anxious today, but     Symptoms: Yes chest pain No difficulty concentrating  Yes dizziness No fatigue  No feelings of losing control No insomnia  No irritable Yes palpitations  No panic attacks No racing thoughts  No shortness of breath Yes sweating  Yes tremors/shakes    GAD-7 Results    01/28/2022   11:16 AM 12/29/2021   11:42 AM 11/16/2021    2:32 PM  GAD-7 Generalized Anxiety Disorder Screening Tool  1. Feeling Nervous, Anxious, or on Edge 0 3   2. Not Being Able to Stop or Control Worrying 0 0   3. Worrying Too Much About Different Things 1 0   4. Trouble Relaxing 1 2   5. Being So Restless it's Hard To Sit Still 0 1   6. Becoming Easily Annoyed or Irritable 1 3   7. Feeling Afraid  As If Something Awful Might Happen 3 0   Total GAD-7 Score 6 9   Difficulty At Work, Home, or Getting  Along With Others? Not difficult at all Extremely difficult      Information is confidential and restricted. Go to Review Flowsheets to unlock data.    PHQ-9 Scores    01/04/2022    8:30 AM 12/10/2021    8:27 AM 11/16/2021    2:35 PM  PHQ9 SCORE ONLY  PHQ-9 Total Score 12 19      Information is confidential and restricted. Go to Review Flowsheets to unlock data.   She also reports she has been having much more sinus congestion and clear nasal drainage lately.  ---------------------------------------------------------------------------------------------------   Medications: Outpatient Medications Prior to Visit  Medication Sig   ALPRAZolam (XANAX) 1 MG tablet TAKE 1/2 TO 1 TABLET BY MOUTH EVERY 8 HOURS AS NEEDED   amitriptyline (ELAVIL) 25 MG tablet TAKE 2 TO 3 TABLETS(50 TO 75 MG) BY MOUTH AT BEDTIME   Aspirin-Acetaminophen-Caffeine (EXCEDRIN PO) Take 1 tablet daily as needed by mouth.   busPIRone (BUSPAR) 30 MG tablet TAKE 1 TABLET BY MOUTH TWICE DAILY   DULoxetine (CYMBALTA) 60 MG capsule Take 1 capsule (60 mg total) by mouth daily.   estradiol (ESTRACE) 2 MG tablet TAKE 1 TABLET(2 MG) BY MOUTH DAILY   lamoTRIgine (LAMICTAL) 100 MG tablet TAKE 1 TABLET BY MOUTH  TWICE DAILY   levothyroxine (SYNTHROID) 75 MCG tablet TAKE 1 TABLET(75 MCG) BY MOUTH DAILY   LYRICA 225 MG capsule TAKE 1 CAPSULE BY MOUTH TWO TIMES DAILY   Magnesium Oxide 420 MG TABS Take 400 mg by mouth daily.   Multiple Vitamins-Minerals (CENTRUM SILVER PO) Take by mouth.   nabumetone (RELAFEN) 750 MG tablet Take 1-2 tablets (750-1,500 mg total) by mouth daily as needed (leg and knee pain).   neomycin-polymyxin-hydrocortisone (CORTISPORIN) 3.5-10000-1 OTIC suspension SHAKE LIQUID AND INSTILL 3 TO 4 DROPS TO AFFECTED EAR FOUR TIMES DAILY   omeprazole (PRILOSEC) 20 MG capsule TAKE 1 CAPSULE BY MOUTH EVERY DAY    PARoxetine (PAXIL) 30 MG tablet Take 1 tablet (30 mg total) by mouth daily.   phenytoin (DILANTIN) 100 MG ER capsule TAKE 2 CAPSULES(200 MG) BY MOUTH TWICE DAILY   propranolol (INDERAL) 10 MG tablet TAKE 1 TABLET(10 MG) BY MOUTH AT BEDTIME   No facility-administered medications prior to visit.    Review of Systems  Constitutional:  Negative for appetite change, chills, fatigue and fever.  Respiratory:  Negative for chest tightness and shortness of breath.   Cardiovascular:  Negative for chest pain and palpitations.  Gastrointestinal:  Negative for abdominal pain, nausea and vomiting.  Neurological:  Negative for dizziness and weakness.       Objective    LMP 04/26/2002 (LMP Unknown)      Physical Exam   Awake, alert, oriented x 3. In no apparent distress    Assessment & Plan     1. Generalized anxiety disorder Had been doing much better with addition of duloxetine to paroxetine until she inadvertently stopped alprazolam. However she is now feeling better and would like to stay off of scheduled alprazolam and take only as needed which I support.   2. Non-seasonal allergic rhinitis, unspecified trigger  - fluticasone (FLONASE) 50 MCG/ACT nasal spray; Place 2 sprays into both nostrils daily.  Dispense: 16 g; Refill: 6      I discussed the assessment and treatment plan with the patient. The patient was provided an opportunity to ask questions and all were answered. The patient agreed with the plan and demonstrated an understanding of the instructions.   The patient was advised to call back or seek an in-person evaluation if the symptoms worsen or if the condition fails to improve as anticipated.  I provided 13 minutes of non-face-to-face time during this encounter.  The entirety of the information documented in the History of Present Illness, Review of Systems and Physical Exam were personally obtained by me. Portions of this information were initially documented by the CMA  and reviewed by me for thoroughness and accuracy.    Lelon Huh, MD [facility] [phone] (phone) [phone] (fax)  [place]

## 2022-02-08 ENCOUNTER — Ambulatory Visit (INDEPENDENT_AMBULATORY_CARE_PROVIDER_SITE_OTHER): Payer: Medicare HMO | Admitting: Psychology

## 2022-02-08 DIAGNOSIS — F3289 Other specified depressive episodes: Secondary | ICD-10-CM

## 2022-02-08 NOTE — Progress Notes (Signed)
[facility] Counselor/Therapist Progress Note  Patient ID: ALSIE YOUNES, MRN: [id number]   Date: 02/08/22  Time Spent: 2:02 pm - 3:00 pm : 58 Minutes  Treatment Type: Individual Therapy.  Reported Symptoms: depression and anxiety.   Mental Status Exam: Appearance:  Casual     Behavior: Appropriate  Motor: Normal  Speech/Language:  Normal Rate  Affect: Depressed  Mood: anxious and depressed  Thought process: normal  Thought content:   WNL  Sensory/Perceptual disturbances:   WNL  Orientation: oriented to person, place, time/date, and situation  Attention: Good  Concentration: Good  Memory: WNL  Fund of knowledge:  Good  Insight:   Good  Judgment:  Good  Impulse Control: Good   Risk Assessment: Danger to Self:  No Self-injurious Behavior: No Danger to Others: No Duty to Warn:no Physical Aggression / Violence:No  Access to Firearms a concern: No  Gang Involvement:No   Subjective:   Sharon Bryan participated from home, via video and consented to treatment. Therapist participated from office. We met online due to [place] pandemic. Sharon Bryan reviewed the events of the past week. She noted functioning well with the Xanax. She noted some rise in anxiety but noted this being manageable. She noted not taking any medication since our last appointment, Xanax. She noted giving more feedback to her significant other and noted this not being received well. She noted identifying behaviors from her SO that creates frustration and irritation.  We identified additional relationship stressors including not being hurt, lack of this joint decision making, lack of empathetic support and understanding, and lack of autonomy/joint decision making In regards to financing.  She discussed often poor interactions in regards to communicating clearly and concisely, the use of empathy, seeking to understand and noted's consistent stonewalling.  Therapist discussed fair fighting  rules and provided resources to pursue online and encouraged Sharon Bryan and her significant other to adopt these rules going forward consistently.  We discussed the importance of addressing issues as a team and seeking to understand the other person's perspective regardless of agreement or disagreement.  Therapist modeled this during the session.  Sharon Bryan was receptive to this and expressed her commitment towards these following steps.  Therapist praised Sharon Bryan for her effort and energy and encouraged taking time to this process.  Therapist provided supportive therapy and a follow-up was scheduled for continued treatment.  Sharon Bryan.  Interventions: Interpersonal   Diagnosis:  Other depression  Psychiatric Treatment: Yes , via PCP Dr. Caryn Section.   Treatment Plan:  Client Abilities/Strengths Sharon Bryan is self-ware, Bryan for change, and flexible.   Support System: Husband and family.   Client Treatment Preferences Outpatient Therapy.   Client Statement of Needs Sharon Bryan would like to engage in enjoyable activities, managing her overall mood and symptoms, improving self-talk, resolve relationship stressors, improve communication and process past events.  Treatment Level Weekly  Symptoms  Depression: loss of interest, feeling down, middle insomnia, lethargy, poor appetite, feeling bad about self, difficulty concentrating,  psycho-motor agitation, no SI.     (Status: maintained) Anxiety: Anxious, difficulty managing worry, worrying about different things, restlessness, easily annoyed, feeling afraid as if something bad might happen.    (Status: maintained)  Goals:   Sharon Bryan experiences symptoms of depression and anxiety.    Target Date: 01/09/23 Frequency: Weekly  Progress: 0 Modality: individual    Therapist will provide referrals for additional resources as appropriate.  Therapist will provide psycho-education regarding Sharon Bryan's diagnosis and  corresponding treatment approaches and  interventions. Licensed Clinical Social Worker, [place], LCSW will support the patient's ability to achieve the goals identified. will employ CBT, BA, Problem-solving, Solution Focused, Mindfulness,  coping skills, & other evidenced-based practices will be used to promote progress towards healthy functioning to help manage decrease symptoms associated with her diagnosis.   Reduce overall level, frequency, and intensity of the feelings of depression, anxiety and panic evidenced by decreased overall symptoms from 6 to 7 days/week to 0 to 1 days/week per client report for at least 3 consecutive months. Verbally express understanding of the relationship between feelings of depression, anxiety and their impact on thinking patterns and behaviors. Verbalize an understanding of the role that distorted thinking plays in creating fears, excessive worry, and ruminations.   Sharon Bryan participated in the creation of the treatment plan)   [name], LCSW

## 2022-02-16 ENCOUNTER — Other Ambulatory Visit: Payer: Self-pay | Admitting: Family Medicine

## 2022-02-16 ENCOUNTER — Ambulatory Visit (INDEPENDENT_AMBULATORY_CARE_PROVIDER_SITE_OTHER): Payer: Medicare HMO | Admitting: Psychology

## 2022-02-16 DIAGNOSIS — F3289 Other specified depressive episodes: Secondary | ICD-10-CM

## 2022-02-16 DIAGNOSIS — R232 Flushing: Secondary | ICD-10-CM

## 2022-02-16 NOTE — Progress Notes (Signed)
[place] Counselor/Therapist Progress Note  Patient ID: CRYSTALANN KORF, MRN: [id number]   Date: 02/16/22  Time Spent: 1:11 pm - 1:59 pm : 48 Minutes  Treatment Type: Individual Therapy.  Reported Symptoms: depression and anxiety.   Mental Status Exam: Appearance:  Casual     Behavior: Appropriate  Motor: Normal  Speech/Language:  Normal Rate  Affect: Depressed  Mood: anxious and depressed  Thought process: normal  Thought content:   WNL  Sensory/Perceptual disturbances:   WNL  Orientation: oriented to person, place, time/date, and situation  Attention: Good  Concentration: Good  Memory: WNL  Fund of knowledge:  Good  Insight:   Good  Judgment:  Good  Impulse Control: Good   Risk Assessment: Danger to Self:  No Self-injurious Behavior: No Danger to Others: No Duty to Warn:no Physical Aggression / Violence:No  Access to Firearms a concern: No  Gang Involvement:No   Subjective:   [name] participated from home, via video and consented to treatment. Therapist participated from home office. We met online due to [place] pandemic. Maicey reviewed the events of the past week. [name] noted feeling better after discontinuing the Xanax. She noted having a discussion with her SO and noted increased effort as a result. She noted wondering about the motivation for the change and noted his continued avoidance to follow-up with a commitment to complete a household task. We continued to explore this, during the session, and her attempts to communicate needs and frustration. We continued to review communicate, assertiveness, and conflict resolution. Therapist modeled this during the session. We worked on identifying feelings and ways to express said feelings positively and clearly. Therapist provided supportive therapy and a follow-up was scheduled for continued treatment.  [name] was engaged and motivated.  Interventions: Interpersonal   Diagnosis:   Other depression  Psychiatric Treatment: Yes , via PCP Dr. Caryn Section.   Treatment Plan:  Client Abilities/Strengths Deletha is self-ware, motivated for change, and flexible.   Support System: Husband and family.   Client Treatment Preferences Outpatient Therapy.   Client Statement of Needs Symone would like to engage in enjoyable activities, managing her overall mood and symptoms, improving self-talk, resolve relationship stressors, improve communication and process past events.  Treatment Level Weekly  Symptoms  Depression: loss of interest, feeling down, middle insomnia, lethargy, poor appetite, feeling bad about self, difficulty concentrating,  psycho-motor agitation, no SI.     (Status: maintained) Anxiety: Anxious, difficulty managing worry, worrying about different things, restlessness, easily annoyed, feeling afraid as if something bad might happen.    (Status: maintained)  Goals:   Kamali experiences symptoms of depression and anxiety.    Target Date: 01/09/23 Frequency: Weekly  Progress: 0 Modality: individual    Therapist will provide referrals for additional resources as appropriate.  Therapist will provide psycho-education regarding Brisia's diagnosis and corresponding treatment approaches and interventions. Licensed Clinical Social Worker, [place], LCSW will support the patient's ability to achieve the goals identified. will employ CBT, BA, Problem-solving, Solution Focused, Mindfulness,  coping skills, & other evidenced-based practices will be used to promote progress towards healthy functioning to help manage decrease symptoms associated with her diagnosis.   Reduce overall level, frequency, and intensity of the feelings of depression, anxiety and panic evidenced by decreased overall symptoms from 6 to 7 days/week to 0 to 1 days/week per client report for at least 3 consecutive months. Verbally express understanding of the relationship between  feelings of depression, anxiety and their impact on thinking patterns  and behaviors. Verbalize an understanding of the role that distorted thinking plays in creating fears, excessive worry, and ruminations.   [name] participated in the creation of the treatment plan)   [name], LCSW

## 2022-02-21 ENCOUNTER — Other Ambulatory Visit: Payer: Self-pay | Admitting: Family Medicine

## 2022-02-21 DIAGNOSIS — G40009 Localization-related (focal) (partial) idiopathic epilepsy and epileptic syndromes with seizures of localized onset, not intractable, without status epilepticus: Secondary | ICD-10-CM

## 2022-02-21 NOTE — Progress Notes (Signed)
Patient husband p.j. slauta report that she appeared to have a seizure in her sleep last night and is requesting that dilantin level be checked.

## 2022-02-25 ENCOUNTER — Ambulatory Visit (INDEPENDENT_AMBULATORY_CARE_PROVIDER_SITE_OTHER): Payer: Medicare HMO | Admitting: Psychology

## 2022-02-25 DIAGNOSIS — F3289 Other specified depressive episodes: Secondary | ICD-10-CM

## 2022-02-25 NOTE — Progress Notes (Signed)
[facility] Counselor/Therapist Progress Note  Patient ID: Sharon Bryan, MRN: [id number]   Date: 02/25/22  Time Spent: 3:37 pm - 4:32 pm : 55 Minutes  Treatment Type: Individual Therapy.  Reported Symptoms: depression and anxiety.   Mental Status Exam: Appearance:  Casual     Behavior: Appropriate  Motor: Normal  Speech/Language:  Normal Rate  Affect: Depressed  Mood: anxious and depressed  Thought process: normal  Thought content:   WNL  Sensory/Perceptual disturbances:   WNL  Orientation: oriented to person, place, time/date, and situation  Attention: Good  Concentration: Good  Memory: WNL  Fund of knowledge:  Good  Insight:   Good  Judgment:  Good  Impulse Control: Good   Risk Assessment: Danger to Self:  No Self-injurious Behavior: No Danger to Others: No Duty to Warn:no Physical Aggression / Violence:No  Access to Firearms a concern: No  Gang Involvement:No   Subjective:   [name] participated from home, via video and consented to treatment. Therapist participated from home office. We met online due to [place] pandemic. Shambria reviewed the events of the past week. Nasreen noted recently experiencing a seizure and discussed her significant other taking care of her. She noted feeling some distress and noting a need to manage this. We reviewed distress tolerance, during the session, and in-depth psycho-education was provided during the session. Therapist modeled this during the session and provided a handout via email for reference and review. Therapist provided supportive therapy and a follow-up was scheduled for continued treatment.  [name] was engaged and motivated. [name] discussed progress in relation to relationship stressors. We explored this and therapist praised her for her effort in this area. Therapist encouraged Khaliyah to engage in weekly relationship check-ins to address concerns, communicate needs, and provide feedback.  This was modeled during the session. Therapist provided supportive therapy.   Interventions: Interpersonal   Diagnosis:  Other depression  Psychiatric Treatment: Yes , via PCP Dr. Caryn Section.   Treatment Plan:  Client Abilities/Strengths Shigeko is self-ware, motivated for change, and flexible.   Support System: Husband and family.   Client Treatment Preferences Outpatient Therapy.   Client Statement of Needs Denea would like to engage in enjoyable activities, managing her overall mood and symptoms, improving self-talk, resolve relationship stressors, improve communication and process past events.  Treatment Level Weekly  Symptoms  Depression: loss of interest, feeling down, middle insomnia, lethargy, poor appetite, feeling bad about self, difficulty concentrating,  psycho-motor agitation, no SI.     (Status: maintained) Anxiety: Anxious, difficulty managing worry, worrying about different things, restlessness, easily annoyed, feeling afraid as if something bad might happen.    (Status: maintained)  Goals:   Nadia experiences symptoms of depression and anxiety.    Target Date: 01/09/23 Frequency: Weekly  Progress: 0 Modality: individual    Therapist will provide referrals for additional resources as appropriate.  Therapist will provide psycho-education regarding Safiyya's diagnosis and corresponding treatment approaches and interventions. Licensed Clinical Social Worker, [place], LCSW will support the patient's ability to achieve the goals identified. will employ CBT, BA, Problem-solving, Solution Focused, Mindfulness,  coping skills, & other evidenced-based practices will be used to promote progress towards healthy functioning to help manage decrease symptoms associated with her diagnosis.   Reduce overall level, frequency, and intensity of the feelings of depression, anxiety and panic evidenced by decreased overall symptoms from 6 to 7 days/week to 0 to 1  days/week per client report for at least 3 consecutive months. Verbally express  understanding of the relationship between feelings of depression, anxiety and their impact on thinking patterns and behaviors. Verbalize an understanding of the role that distorted thinking plays in creating fears, excessive worry, and ruminations.   [name] participated in the creation of the treatment plan)   [name], LCSW

## 2022-02-27 ENCOUNTER — Other Ambulatory Visit: Payer: Self-pay | Admitting: Family Medicine

## 2022-02-27 DIAGNOSIS — G4700 Insomnia, unspecified: Secondary | ICD-10-CM

## 2022-03-01 NOTE — Telephone Encounter (Signed)
Requested Prescriptions  Pending Prescriptions Disp Refills   amitriptyline (ELAVIL) 25 MG tablet [Pharmacy Med Name: AMITRIPTYLINE '25MG'$  TABLETS] 90 tablet 0    Sig: TAKE 2 TO 3 TABLETS(50 TO 75 MG) BY MOUTH AT BEDTIME     Psychiatry:  Antidepressants - Heterocyclics (TCAs) Passed - 02/27/2022  9:22 PM      Passed - Completed PHQ-2 or PHQ-9 in the last 360 days      Passed - Valid encounter within last 6 months    Recent Outpatient Visits           1 month ago Generalized anxiety disorder   [place], Donald E, MD   1 month ago Other depression   [place], Donald E, MD   2 months ago Other depression   [place], Donald E, MD   2 months ago Diarrhea, unspecified type   [facility] Birdie Sons, MD   4 months ago [place], Donald E, MD       Future Appointments             In 3 weeks Ralene Bathe, MD [place]

## 2022-03-08 ENCOUNTER — Other Ambulatory Visit: Payer: Self-pay | Admitting: Family Medicine

## 2022-03-08 NOTE — Telephone Encounter (Signed)
Transfer request Medication: LYRICA 225 MG capsule  [facility] [place], [place] - [address] AT [place] [place]  [address] [place] [address]  Phone: [phone] Fax: [phone]   This pharmacy has the Rx in stock

## 2022-03-09 NOTE — Telephone Encounter (Signed)
Requested medication (s) are due for refill today: yes  Requested medication (s) are on the active medication list: yes  Last refill:  07/25/21  Future visit scheduled: yes  Notes to clinic:  Unable to refill per protocol, cannot delegate.      Requested Prescriptions  Pending Prescriptions Disp Refills   pregabalin (LYRICA) 225 MG capsule 180 capsule 5    Sig: Take 1 capsule (225 mg total) by mouth 2 (two) times daily.     Not Delegated - Neurology:  Anticonvulsants - Controlled - pregabalin Failed - 03/08/2022 11:52 AM      Failed - This refill cannot be delegated      Passed - Cr in normal range and within 360 days    Creatinine  Date Value Ref Range Status  09/18/2012 0.87 0.60 - 1.30 mg/dL Final   Creatinine, Ser  Date Value Ref Range Status  10/19/2021 0.77 0.57 - 1.00 mg/dL Final         Passed - Completed PHQ-2 or PHQ-9 in the last 360 days      Passed - Valid encounter within last 12 months    Recent Outpatient Visits           1 month ago Generalized anxiety disorder   [place], Donald E, MD   2 months ago Other depression   [place], Donald E, MD   2 months ago Other depression   [place], Donald E, MD   2 months ago Diarrhea, unspecified type   [facility] Birdie Sons, MD   4 months ago [place], Donald E, MD       Future Appointments             In 2 weeks Ralene Bathe, MD [place]

## 2022-03-10 ENCOUNTER — Ambulatory Visit (INDEPENDENT_AMBULATORY_CARE_PROVIDER_SITE_OTHER): Payer: Medicare HMO | Admitting: Psychology

## 2022-03-10 DIAGNOSIS — F3289 Other specified depressive episodes: Secondary | ICD-10-CM

## 2022-03-10 MED ORDER — PREGABALIN 225 MG PO CAPS: 225.0000 mg | ORAL_CAPSULE | Freq: Two times a day (BID) | ORAL | 5 refills | Status: DC

## 2022-03-10 NOTE — Progress Notes (Signed)
[facility] Counselor/Therapist Progress Note  Patient ID: Sharon Bryan, MRN: [id number]   Date: 03/10/22  Time Spent: 9:07 am - 9:53 am : 24 Minutes  Treatment Type: Individual Therapy.  Reported Symptoms: depression and anxiety.   Mental Status Exam: Appearance:  Casual     Behavior: Appropriate  Motor: Normal  Speech/Language:  Normal Rate  Affect: Depressed  Mood: normal  Thought process: normal  Thought content:   WNL  Sensory/Perceptual disturbances:   WNL  Orientation: oriented to person, place, time/date, and situation  Attention: Good  Concentration: Good  Memory: WNL  Fund of knowledge:  Good  Insight:   Good  Judgment:  Good  Impulse Control: Good   Risk Assessment: Danger to Self:  No Self-injurious Behavior: No Danger to Others: No Duty to Warn:no Physical Aggression / Violence:No  Access to Firearms a concern: No  Gang Involvement:No   Subjective:   Sharon Bryan participated from home, via video and consented to treatment. Therapist participated from home office. We met online due to Sharon Bryan pandemic. Sharon Bryan reviewed the events of the past week. She noted progress in addressing specific task completion in relation to unmet commitments. She noted having to force the issue. We explored this during the session and ways to navigate this stress going forward. We discussed ways to understand her partner's perspective. We reviewed communication and assertiveness. She noted adopting a shelter dog and noted the positives in relation to this. She noted this providing additional structure for her day-to-day.  Therapist encouraged Sharon Bryan to identify moments of positivity and gratitude on a daily basis and to be mindful of these day-to-day. Therapist provided supportive therapy and validated Sharon Bryan's experience.   Interventions: Interpersonal   Diagnosis:  Other depression  Psychiatric Treatment: Yes , via PCP Dr. Caryn Section.   Treatment  Plan:  Client Abilities/Strengths Sharon Bryan is self-ware, motivated for change, and flexible.   Support System: Husband and family.   Client Treatment Preferences Outpatient Therapy.   Client Statement of Needs Sharon Bryan would like to engage in enjoyable activities, managing her overall mood and symptoms, improving self-talk, resolve relationship stressors, improve communication and process past events.  Treatment Level Weekly  Symptoms  Depression: loss of interest, feeling down, middle insomnia, lethargy, poor appetite, feeling bad about self, difficulty concentrating,  psycho-motor agitation, no SI.     (Status: maintained) Anxiety: Anxious, difficulty managing worry, worrying about different things, restlessness, easily annoyed, feeling afraid as if something bad might happen.    (Status: maintained)  Goals:   Sharon Bryan experiences symptoms of depression and anxiety.    Target Date: 01/09/23 Frequency: Weekly  Progress: 0 Modality: individual    Therapist will provide referrals for additional resources as appropriate.  Therapist will provide psycho-education regarding [place]ia's diagnosis and corresponding treatment approaches and interventions. Licensed Clinical Social Worker, [place], LCSW will support the patient's ability to achieve the goals identified. will employ CBT, BA, Problem-solving, Solution Focused, Mindfulness,  coping skills, & other evidenced-based practices will be used to promote progress towards healthy functioning to help manage decrease symptoms associated with her diagnosis.   Reduce overall level, frequency, and intensity of the feelings of depression, anxiety and panic evidenced by decreased overall symptoms from 6 to 7 days/week to 0 to 1 days/week per client report for at least 3 consecutive months. Verbally express understanding of the relationship between feelings of depression, anxiety and their impact on thinking patterns and  behaviors. Verbalize an understanding of the role that distorted thinking  plays in creating fears, excessive worry, and ruminations.   Sharon Bryan participated in the creation of the treatment plan)   Sharon Irish, LCSW

## 2022-03-11 ENCOUNTER — Encounter: Payer: Self-pay | Admitting: Family Medicine

## 2022-03-14 ENCOUNTER — Other Ambulatory Visit: Payer: Self-pay

## 2022-03-14 ENCOUNTER — Telehealth (INDEPENDENT_AMBULATORY_CARE_PROVIDER_SITE_OTHER): Payer: Medicare HMO | Admitting: Family Medicine

## 2022-03-14 ENCOUNTER — Encounter: Payer: Self-pay | Admitting: Family Medicine

## 2022-03-14 DIAGNOSIS — G4719 Other hypersomnia: Secondary | ICD-10-CM | POA: Diagnosis not present

## 2022-03-14 DIAGNOSIS — G40909 Epilepsy, unspecified, not intractable, without status epilepticus: Secondary | ICD-10-CM | POA: Diagnosis not present

## 2022-03-14 DIAGNOSIS — R5383 Other fatigue: Secondary | ICD-10-CM

## 2022-03-14 DIAGNOSIS — F411 Generalized anxiety disorder: Secondary | ICD-10-CM

## 2022-03-14 DIAGNOSIS — F3289 Other specified depressive episodes: Secondary | ICD-10-CM

## 2022-03-14 DIAGNOSIS — E049 Nontoxic goiter, unspecified: Secondary | ICD-10-CM

## 2022-03-14 MED ORDER — DULOXETINE HCL 60 MG PO CPEP: 60.0000 mg | ORAL_CAPSULE | Freq: Every day | ORAL | 4 refills | Status: DC

## 2022-03-14 MED ORDER — PAROXETINE HCL 30 MG PO TABS: 30.0000 mg | ORAL_TABLET | Freq: Every day | ORAL | 4 refills | Status: DC

## 2022-03-14 NOTE — Progress Notes (Signed)
MyChart Video Visit    Virtual Visit via Video Note   This format is felt to be most appropriate for this patient at this time. Physical exam was limited by quality of the video and audio technology used for the visit.   Patient location: Home Provider location: [facility]   I discussed the limitations of evaluation and management by telemedicine and the availability of in person appointments. The patient expressed understanding and agreed to proceed.  Patient: CAMREN WAHLERT   DOB: 09-03-1965   57 y.o. Female  MRN: [id number] Visit Date: 03/14/2022  Today's healthcare provider: Lelon Huh, MD   No chief complaint on file.  Subjective    HPI  Fatigue  Complains or worsening of fatigue since having night seizure 2 weeks ago.She states she was asleep when she seizure. Now exhausted all the time. No recent medication changes except going off alprazolam. Is resting well. States report energy level was her normal prior to seizure.   Walks frequently in the morning. Mood is very good on current medications.    Wt Readings from Last 3 Encounters:  10/19/21 258 lb (117 kg)  08/04/21 267 lb (121.1 kg)  06/18/21 269 lb (122 kg)    Lab Results  Component Value Date   WBC 8.4 10/19/2021   HGB 12.8 10/19/2021   HCT 37.9 10/19/2021   MCV 93 10/19/2021   PLT 262 10/19/2021   Lab Results  Component Value Date   TSH 2.460 10/19/2021   Lab Results  Component Value Date   NA 142 10/19/2021   K 4.3 10/19/2021   CO2 17 (L) 10/19/2021   BUN 15 10/19/2021   CREATININE 0.77 10/19/2021   CALCIUM 9.6 10/19/2021   GLUCOSE 87 10/19/2021     ---------------------------------------------------------------------------------------------------    Medications: Outpatient Medications Prior to Visit  Medication Sig   ALPRAZolam (XANAX) 1 MG tablet Take 1/2 tablet once or twice a day as needed for anxiety and panic attacks   amitriptyline (ELAVIL) 25 MG  tablet TAKE 2 TO 3 TABLETS(50 TO 75 MG) BY MOUTH AT BEDTIME   Aspirin-Acetaminophen-Caffeine (EXCEDRIN PO) Take 1 tablet daily as needed by mouth.   busPIRone (BUSPAR) 30 MG tablet TAKE 1 TABLET BY MOUTH TWICE DAILY   DULoxetine (CYMBALTA) 60 MG capsule Take 1 capsule (60 mg total) by mouth daily.   estradiol (ESTRACE) 2 MG tablet TAKE 1 TABLET(2 MG) BY MOUTH DAILY   fluticasone (FLONASE) 50 MCG/ACT nasal spray Place 2 sprays into both nostrils daily.   lamoTRIgine (LAMICTAL) 100 MG tablet TAKE 1 TABLET BY MOUTH TWICE DAILY   levothyroxine (SYNTHROID) 75 MCG tablet TAKE 1 TABLET(75 MCG) BY MOUTH DAILY   Magnesium Oxide 420 MG TABS Take 400 mg by mouth daily.   Multiple Vitamins-Minerals (CENTRUM SILVER PO) Take by mouth.   nabumetone (RELAFEN) 750 MG tablet Take 1-2 tablets (750-1,500 mg total) by mouth daily as needed (leg and knee pain).   neomycin-polymyxin-hydrocortisone (CORTISPORIN) 3.5-10000-1 OTIC suspension SHAKE LIQUID AND INSTILL 3 TO 4 DROPS TO AFFECTED EAR FOUR TIMES DAILY   omeprazole (PRILOSEC) 20 MG capsule TAKE 1 CAPSULE BY MOUTH EVERY DAY   PARoxetine (PAXIL) 30 MG tablet Take 1 tablet (30 mg total) by mouth daily.   phenytoin (DILANTIN) 100 MG ER capsule TAKE 2 CAPSULES(200 MG) BY MOUTH TWICE DAILY   pregabalin (LYRICA) 225 MG capsule Take 1 capsule (225 mg total) by mouth 2 (two) times daily.   propranolol (INDERAL) 10 MG tablet TAKE  1 TABLET(10 MG) BY MOUTH AT BEDTIME   No facility-administered medications prior to visit.    Review of Systems  Constitutional:  Positive for fatigue. Negative for appetite change, chills and fever.  Respiratory:  Negative for chest tightness and shortness of breath.   Cardiovascular:  Negative for chest pain and palpitations.  Gastrointestinal:  Negative for abdominal pain, nausea and vomiting.  Neurological:  Negative for dizziness and weakness.     Objective    LMP 04/26/2002 (LMP Unknown)    Physical Exam   Awake, alert,  oriented x 3. In no apparent distress   Assessment & Plan     1. Excessive daytime sleepiness  - Ambulatory referral to Sleep Studies  2. Other fatigue  - T4 - CBC - Comprehensive metabolic panel - TSH - T4, free  3. Other depression Refill DULoxetine (CYMBALTA) 60 MG capsule; Take 1 capsule (60 mg total) by mouth daily.  Dispense: 90 capsule; Refill: 4  4. Generalized anxiety disorder refill PARoxetine (PAXIL) 30 MG tablet; Take 1 tablet (30 mg total) by mouth daily.  Dispense: 90 tablet; Refill: 4  5. Seizure disorder (HCC) - Dilantin (Phenytoin) level, total  6. Goiter  - T4, free     I discussed the assessment and treatment plan with the patient. The patient was provided an opportunity to ask questions and all were answered. The patient agreed with the plan and demonstrated an understanding of the instructions.   The patient was advised to call back or seek an in-person evaluation if the symptoms worsen or if the condition fails to improve as anticipated.  I provided 12 minutes of non-face-to-face time during this encounter.  The entirety of the information documented in the History of Present Illness, Review of Systems and Physical Exam were personally obtained by me. Portions of this information were initially documented by the CMA and reviewed by me for thoroughness and accuracy.    Lelon Huh, MD [place] [phone] (phone) [phone] (fax)  [place]

## 2022-03-15 DIAGNOSIS — G40909 Epilepsy, unspecified, not intractable, without status epilepticus: Secondary | ICD-10-CM | POA: Diagnosis not present

## 2022-03-15 DIAGNOSIS — E049 Nontoxic goiter, unspecified: Secondary | ICD-10-CM | POA: Diagnosis not present

## 2022-03-15 DIAGNOSIS — R5383 Other fatigue: Secondary | ICD-10-CM | POA: Diagnosis not present

## 2022-03-16 LAB — COMPREHENSIVE METABOLIC PANEL
ALT: 19 IU/L (ref 0–32)
AST: 16 IU/L (ref 0–40)
Albumin/Globulin Ratio: 1.4 (ref 1.2–2.2)
Albumin: 4.2 g/dL (ref 3.8–4.9)
Alkaline Phosphatase: 74 IU/L (ref 44–121)
BUN/Creatinine Ratio: 15 (ref 9–23)
BUN: 11 mg/dL (ref 6–24)
Bilirubin Total: <0.2 mg/dL (ref 0.0–1.2)
CO2: 22 mmol/L (ref 20–29)
Calcium: 9.4 mg/dL (ref 8.7–10.2)
Chloride: 105 mmol/L (ref 96–106)
Creatinine, Ser: 0.75 mg/dL (ref 0.57–1.00)
Globulin, Total: 2.9 g/dL (ref 1.5–4.5)
Glucose: 89 mg/dL (ref 70–99)
Potassium: 4.1 mmol/L (ref 3.5–5.2)
Sodium: 142 mmol/L (ref 134–144)
Total Protein: 7.1 g/dL (ref 6.0–8.5)
eGFR: 95 mL/min/{1.73_m2} (ref >59)

## 2022-03-16 LAB — CBC
Hematocrit: 38.5 % (ref 34.0–46.6)
Hemoglobin: 13.6 g/dL (ref 11.1–15.9)
MCH: 32.5 pg (ref 26.6–33.0)
MCHC: 35.3 g/dL (ref 31.5–35.7)
MCV: 92 fL (ref 79–97)
Platelets: 305 10*3/uL (ref 150–450)
RBC: 4.18 x10E6/uL (ref 3.77–5.28)
RDW: 11.8 % (ref 11.7–15.4)
WBC: 8.5 10*3/uL (ref 3.4–10.8)

## 2022-03-16 LAB — PHENYTOIN LEVEL, TOTAL: Phenytoin (Dilantin), Serum: 4.9 ug/mL — ABNORMAL LOW (ref 10.0–20.0)

## 2022-03-16 LAB — T4: T4, Total: 8.2 ug/dL (ref 4.5–12.0)

## 2022-03-16 LAB — TSH: TSH: 3.51 u[IU]/mL (ref 0.450–4.500)

## 2022-03-16 LAB — T4, FREE: Free T4: 1.41 ng/dL (ref 0.82–1.77)

## 2022-03-17 ENCOUNTER — Ambulatory Visit (INDEPENDENT_AMBULATORY_CARE_PROVIDER_SITE_OTHER): Payer: Medicare HMO | Admitting: Psychology

## 2022-03-17 DIAGNOSIS — F3289 Other specified depressive episodes: Secondary | ICD-10-CM

## 2022-03-17 NOTE — Progress Notes (Signed)
[facility] Counselor/Therapist Progress Note  Patient ID: ANNAYAH MUNZ, MRN: [id number]   Date: 03/17/22  Time Spent: 2:04 pm - 2:52 pm : 48 Minutes  Treatment Type: Individual Therapy.  Reported Symptoms: depression and anxiety.   Mental Status Exam: Appearance:  Casual     Behavior: Appropriate  Motor: Normal  Speech/Language:  Normal Rate  Affect: Depressed  Mood: normal  Thought process: normal  Thought content:   WNL  Sensory/Perceptual disturbances:   WNL  Orientation: oriented to person, place, time/date, and situation  Attention: Good  Concentration: Good  Memory: WNL  Fund of knowledge:  Good  Insight:   Good  Judgment:  Good  Impulse Control: Good   Risk Assessment: Danger to Self:  No Self-injurious Behavior: No Danger to Others: No Duty to Warn:no Physical Aggression / Violence:No  Access to Firearms a concern: No  Gang Involvement:No   In case of a mental health emergency:  60 - confidential suicide hotline. [facility]):        [place], [place] [address]       [phone] 3.   911  4.   Visiting Nearest ED.    Subjective:   [name] participated from home, via video and consented to treatment. Therapist participated from home office. We met online due to [place] pandemic. Armella reviewed the events of the past week. Aby noted the unexpected loss of her pet ( Mischka) which necessitated this session. She noted her dog being attacked and killed by a stray. She noted feeling along and unsupported. She noted experiencing SI yesterday but denied any SI during the session. We reviewed safety during the session. Please safety plan above. [name] expressed her understanding and agreement to maintain safety. She noted experiencing pet loss in the past due to old age and illness. We worked on exploring her feelings, identifying her feelings, being mindful of own  needs, engaging in self-care, and reaching out for additional support. [name] committed to being safe and to following the safety-plan. She noted her intent to contact provider, ahead of the scheduled appointment, should the need arise. Therapist praised [name] for her effort and energy during the session and her commitment to managing her mood and safety.  Therapist provided supportive therapy and validated [name]'s experience. Follow-up appointments were scheduled for continued treatment.   Interventions: Interpersonal   Diagnosis:  Other depression  Psychiatric Treatment: Yes , via PCP Dr. Caryn Section.   Treatment Plan:  Client Abilities/Strengths Rynn is self-ware, motivated for change, and flexible.   Support System: Husband and family.   Client Treatment Preferences Outpatient Therapy.   Client Statement of Needs Britt would like to engage in enjoyable activities, managing her overall mood and symptoms, improving self-talk, resolve relationship stressors, improve communication and process past events.  Treatment Level Weekly  Symptoms  Depression: loss of interest, feeling down, middle insomnia, lethargy, poor appetite, feeling bad about self, difficulty concentrating,  psycho-motor agitation, no SI.     (Status: maintained) Anxiety: Anxious, difficulty managing worry, worrying about different things, restlessness, easily annoyed, feeling afraid as if something bad might happen.    (Status: maintained)  Goals:   Ketty experiences symptoms of depression and anxiety.    Target Date: 01/09/23 Frequency: Weekly  Progress: 0 Modality: individual    Therapist will provide referrals for additional resources as appropriate.  Therapist will provide psycho-education regarding Miana's diagnosis and corresponding treatment approaches and interventions. Licensed Clinical Social  Worker, [name], LCSW will support the patient's ability to achieve the goals identified.  will employ CBT, BA, Problem-solving, Solution Focused, Mindfulness,  coping skills, & other evidenced-based practices will be used to promote progress towards healthy functioning to help manage decrease symptoms associated with her diagnosis.   Reduce overall level, frequency, and intensity of the feelings of depression, anxiety and panic evidenced by decreased overall symptoms from 6 to 7 days/week to 0 to 1 days/week per client report for at least 3 consecutive months. Verbally express understanding of the relationship between feelings of depression, anxiety and their impact on thinking patterns and behaviors. Verbalize an understanding of the role that distorted thinking plays in creating fears, excessive worry, and ruminations.   [name] participated in the creation of the treatment plan)   [name], LCSW

## 2022-03-18 ENCOUNTER — Other Ambulatory Visit: Payer: Self-pay | Admitting: Family Medicine

## 2022-03-18 DIAGNOSIS — M7041 Prepatellar bursitis, right knee: Secondary | ICD-10-CM

## 2022-03-18 NOTE — Telephone Encounter (Signed)
Requested Prescriptions  Pending Prescriptions Disp Refills   nabumetone (RELAFEN) 750 MG tablet [Pharmacy Med Name: NABUMETONE '750MG'$  TABLETS] 60 tablet 3    Sig: TAKE 1 TO 2 TABLETS(750 TO 1500 MG) BY MOUTH DAILY AS NEEDED FOR LEG AND KNEE PAIN     Analgesics:  NSAIDS Failed - 03/18/2022  3:31 AM      Failed - Manual Review: Labs are only required if the patient has taken medication for more than 8 weeks.      Passed - Cr in normal range and within 360 days    Creatinine  Date Value Ref Range Status  09/18/2012 0.87 0.60 - 1.30 mg/dL Final   Creatinine, Ser  Date Value Ref Range Status  03/15/2022 0.75 0.57 - 1.00 mg/dL Final         Passed - HGB in normal range and within 360 days    Hemoglobin  Date Value Ref Range Status  03/15/2022 13.6 11.1 - 15.9 g/dL Final         Passed - PLT in normal range and within 360 days    Platelets  Date Value Ref Range Status  03/15/2022 305 150 - 450 x10E3/uL Final         Passed - HCT in normal range and within 360 days    Hematocrit  Date Value Ref Range Status  03/15/2022 38.5 34.0 - 46.6 % Final         Passed - eGFR is 30 or above and within 360 days    EGFR (African American)  Date Value Ref Range Status  09/18/2012 >60  Final   GFR calc Af Amer  Date Value Ref Range Status  02/25/2020 117 >59 mL/min/1.73 Final    Comment:    **In accordance with recommendations from the NKF-ASN Task force,**   [facility] is in the process of updating its eGFR calculation to the   2021 CKD-EPI creatinine equation that estimates kidney function   without a race variable.    EGFR (Non-African Amer.)  Date Value Ref Range Status  09/18/2012 >60  Final    Comment:    eGFR values <73m/min/1.73 m2 may be an indication of chronic kidney disease (CKD). Calculated eGFR is useful in patients with stable renal function. The eGFR calculation will not be reliable in acutely ill patients when serum creatinine is changing rapidly. It is not useful  in  patients on dialysis. The eGFR calculation may not be applicable to patients at the low and high extremes of body sizes, pregnant women, and vegetarians.    GFR calc non Af Amer  Date Value Ref Range Status  02/25/2020 101 >59 mL/min/1.73 Final   eGFR  Date Value Ref Range Status  03/15/2022 95 >59 mL/min/1.73 Final         Passed - Patient is not pregnant      Passed - Valid encounter within last 12 months    Recent Outpatient Visits           4 days ago Excessive daytime sleepiness   [facility] [facility] [place] [place]   1 month ago Generalized anxiety disorder   [facility] [facility] [place]   2 months ago Other depression   [facility] [place] [place]   2 months ago Other depression   [facility] [place]   3 months ago Diarrhea, unspecified type   [facility]       Future Appointments             In 1 week Ralene Bathe, MD [place]

## 2022-03-21 ENCOUNTER — Other Ambulatory Visit: Payer: Self-pay

## 2022-03-21 ENCOUNTER — Encounter: Payer: Self-pay | Admitting: Family Medicine

## 2022-03-21 DIAGNOSIS — Z1231 Encounter for screening mammogram for malignant neoplasm of breast: Secondary | ICD-10-CM

## 2022-03-21 DIAGNOSIS — Z01419 Encounter for gynecological examination (general) (routine) without abnormal findings: Secondary | ICD-10-CM

## 2022-03-23 NOTE — Telephone Encounter (Signed)
Referral placed.

## 2022-03-24 ENCOUNTER — Ambulatory Visit (INDEPENDENT_AMBULATORY_CARE_PROVIDER_SITE_OTHER): Payer: Medicare HMO | Admitting: Psychology

## 2022-03-24 DIAGNOSIS — F3289 Other specified depressive episodes: Secondary | ICD-10-CM | POA: Diagnosis not present
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
## 2022-03-28 ENCOUNTER — Ambulatory Visit (INDEPENDENT_AMBULATORY_CARE_PROVIDER_SITE_OTHER): Payer: Medicare HMO | Admitting: Dermatology

## 2022-03-28 ENCOUNTER — Encounter: Payer: Self-pay | Admitting: Dermatology

## 2022-03-28 VITALS — BP 124/69 | HR 78

## 2022-03-28 DIAGNOSIS — D229 Melanocytic nevi, unspecified: Secondary | ICD-10-CM

## 2022-03-28 DIAGNOSIS — L814 Other melanin hyperpigmentation: Secondary | ICD-10-CM | POA: Diagnosis not present

## 2022-03-28 DIAGNOSIS — L821 Other seborrheic keratosis: Secondary | ICD-10-CM | POA: Diagnosis not present

## 2022-03-28 DIAGNOSIS — Z79899 Other long term (current) drug therapy: Secondary | ICD-10-CM | POA: Diagnosis not present

## 2022-03-28 DIAGNOSIS — L2089 Other atopic dermatitis: Secondary | ICD-10-CM

## 2022-03-28 DIAGNOSIS — L853 Xerosis cutis: Secondary | ICD-10-CM

## 2022-03-28 DIAGNOSIS — L578 Other skin changes due to chronic exposure to nonionizing radiation: Secondary | ICD-10-CM

## 2022-03-28 DIAGNOSIS — Z1283 Encounter for screening for malignant neoplasm of skin: Secondary | ICD-10-CM

## 2022-03-28 DIAGNOSIS — D1801 Hemangioma of skin and subcutaneous tissue: Secondary | ICD-10-CM

## 2022-03-28 MED ORDER — MOMETASONE FUROATE 0.1 % EX CREA: TOPICAL_CREAM | CUTANEOUS | 2 refills | Status: DC

## 2022-03-28 NOTE — Progress Notes (Signed)
New Patient Visit  Subjective  Sharon Bryan is a 57 y.o. female who presents for the following: Annual Exam (No personal Hx of skin cancer or dysplastic nevi). The patient presents for Total-Body Skin Exam (TBSE) for skin cancer screening and mole check.  The patient has spots, moles and lesions to be evaluated, some may be new or changing and the patient has concerns that these could be cancer. Fiance' with patient.   Review of Systems: No other skin or systemic complaints except as noted in HPI or Assessment and Plan.  Objective  Well appearing patient in no apparent distress; mood and affect are within normal limits.  A full examination was performed including scalp, head, eyes, ears, nose, lips, neck, chest, axillae, abdomen, back, buttocks, bilateral upper extremities, bilateral lower extremities, hands, feet, fingers, toes, fingernails, and toenails. All findings within normal limits unless otherwise noted below.  feet Scaly erythematous papules and patches +/- dyspigmentation, lichenification, excoriations.    Assessment & Plan   Lentigines - Scattered tan macules - Due to sun exposure - Benign-appearing, observe - Recommend daily broad spectrum sunscreen SPF 30+ to sun-exposed areas, reapply every 2 hours as needed. - Call for any changes  Seborrheic Keratoses - Stuck-on, waxy, tan-brown papules and/or plaques  - Benign-appearing - Discussed benign etiology and prognosis. - Observe - Call for any changes  Melanocytic Nevi - Tan-brown and/or pink-flesh-colored symmetric macules and papules - Benign appearing on exam today - Observation - Call clinic for new or changing moles - Recommend daily use of broad spectrum spf 30+ sunscreen to sun-exposed areas.   Hemangiomas - Red papules - Discussed benign nature - Observe - Call for any changes  Actinic Damage - Chronic condition, secondary to cumulative UV/sun exposure - diffuse scaly erythematous  macules with underlying dyspigmentation - Recommend daily broad spectrum sunscreen SPF 30+ to sun-exposed areas, reapply every 2 hours as needed.  - Staying in the shade or wearing long sleeves, sun glasses (UVA+UVB protection) and wide brim hats (4-inch brim around the entire circumference of the hat) are also recommended for sun protection.  - Call for new or changing lesions.  Skin cancer screening performed today.  Xerosis - diffuse xerotic patches - recommend gentle, hydrating skin care - gentle skin care handout given  Other atopic dermatitis feet Chronic and persistent condition with duration or expected duration over one year. Condition is bothersome/symptomatic for patient. Currently flared. Atopic dermatitis (eczema) is a chronic, relapsing, pruritic condition that can significantly affect quality of life. It is often associated with allergic rhinitis and/or asthma and can require treatment with topical medications, phototherapy, or in severe cases biologic injectable medication (Dupixent; Adbry) or Oral JAK inhibitors.  Start Mometasone cream twice daily to affected body areas 5 days per week as needed.   Topical steroids (such as triamcinolone, fluocinolone, fluocinonide, mometasone, clobetasol, halobetasol, betamethasone, hydrocortisone) can cause thinning and lightening of the skin if they are used for too long in the same area. Your physician has selected the right strength medicine for your problem and area affected on the body. Please use your medication only as directed by your physician to prevent side effects.   mometasone (ELOCON) 0.1 % cream - feet Apply twice daily to affected body areas 5 days per week as needed. Avoid applying to face, groin, and axilla  Return in about 4 months (around 07/28/2022) for Atopic Dermatitis Follow Up.  I, Emelia Salisbury, CMA, am acting as scribe for Sarina Ser, MD. Documentation: I  have reviewed the above documentation for accuracy and  completeness, and I agree with the above.  Sarina Ser, MD

## 2022-03-28 NOTE — Patient Instructions (Addendum)
Start Mometasone cream twice daily to affected body areas 5 days per week as needed.   Topical steroids (such as triamcinolone, fluocinolone, fluocinonide, mometasone, clobetasol, halobetasol, betamethasone, hydrocortisone) can cause thinning and lightening of the skin if they are used for too long in the same area. Your physician has selected the right strength medicine for your problem and area affected on the body. Please use your medication only as directed by your physician to prevent side effects.     Recommend daily broad spectrum sunscreen SPF 30+ to sun-exposed areas, reapply every 2 hours as needed. Call for new or changing lesions.  Staying in the shade or wearing long sleeves, sun glasses (UVA+UVB protection) and wide brim hats (4-inch brim around the entire circumference of the hat) are also recommended for sun protection.    Seborrheic Keratosis  What causes seborrheic keratoses? Seborrheic keratoses are harmless, common skin growths that first appear during adult life.  As time goes by, more growths appear.  Some people may develop a large number of them.  Seborrheic keratoses appear on both covered and uncovered body parts.  They are not caused by sunlight.  The tendency to develop seborrheic keratoses can be inherited.  They vary in color from skin-colored to gray, brown, or even black.  They can be either smooth or have a rough, warty surface.   Seborrheic keratoses are superficial and look as if they were stuck on the skin.  Under the microscope this type of keratosis looks like layers upon layers of skin.  That is why at times the top layer may seem to fall off, but the rest of the growth remains and re-grows.    Treatment Seborrheic keratoses do not need to be treated, but can easily be removed in the office.  Seborrheic keratoses often cause symptoms when they rub on clothing or jewelry.  Lesions can be in the way of shaving.  If they become inflamed, they can cause itching,  soreness, or burning.  Removal of a seborrheic keratosis can be accomplished by freezing, burning, or surgery. If any spot bleeds, scabs, or grows rapidly, please return to have it checked, as these can be an indication of a skin cancer.   Gentle Skin Care Guide  1. Bathe no more than once a day.  2. Avoid bathing in hot water  3. Use a mild soap like Dove, Vanicream, Cetaphil, CeraVe. Can use Lever 2000 or Cetaphil antibacterial soap  4. Use soap only where you need it. On most days, use it under your arms, between your legs, and on your feet. Let the water rinse other areas unless visibly dirty.  5. When you get out of the bath/shower, use a towel to gently blot your skin dry, don't rub it.  6. While your skin is still a little damp, apply a moisturizing cream such as Vanicream, CeraVe, Cetaphil, Eucerin, Sarna lotion or plain Vaseline Jelly. For hands apply Neutrogena [place] Hand Cream or Excipial Hand Cream.  7. Reapply moisturizer any time you start to itch or feel dry.  8. Sometimes using free and clear laundry detergents can be helpful. Fabric softener sheets should be avoided. Downy Free & Gentle liquid, or any liquid fabric softener that is free of dyes and perfumes, it acceptable to use  9. If your doctor has given you prescription creams you may apply moisturizers over them      Melanoma ABCDEs  Melanoma is the most dangerous type of skin cancer, and is the leading cause  of death from skin disease.  You are more likely to develop melanoma if you: Have light-colored skin, light-colored eyes, or red or blond hair Spend a lot of time in the sun Tan regularly, either outdoors or in a tanning bed Have had blistering sunburns, especially during childhood Have a close family member who has had a melanoma Have atypical moles or large birthmarks  Early detection of melanoma is key since treatment is typically straightforward and cure rates are extremely high if we catch it  early.   The first sign of melanoma is often a change in a mole or a new dark spot.  The ABCDE system is a way of remembering the signs of melanoma.  A for asymmetry:  The two halves do not match. B for border:  The edges of the growth are irregular. C for color:  A mixture of colors are present instead of an even brown color. D for diameter:  Melanomas are usually (but not always) greater than 30m - the size of a pencil eraser. E for evolution:  The spot keeps changing in size, shape, and color.  Please check your skin once per month between visits. You can use a small mirror in front and a large mirror behind you to keep an eye on the back side or your body.   If you see any new or changing lesions before your next follow-up, please call to schedule a visit.  Please continue daily skin protection including broad spectrum sunscreen SPF 30+ to sun-exposed areas, reapplying every 2 hours as needed when you're outdoors.   Staying in the shade or wearing long sleeves, sun glasses (UVA+UVB protection) and wide brim hats (4-inch brim around the entire circumference of the hat) are also recommended for sun protection.     Due to recent changes in healthcare laws, you may see results of your pathology and/or laboratory studies on MyChart before the doctors have had a chance to review them. We understand that in some cases there may be results that are confusing or concerning to you. Please understand that not all results are received at the same time and often the doctors may need to interpret multiple results in order to provide you with the best plan of care or course of treatment. Therefore, we ask that you please give u[place]2 business days to thoroughly review all your results before contacting the office for clarification. Should we see a critical lab result, you will be contacted sooner.   If You Need Anything After Your Visit  If you have any questions or concerns for your doctor, please call  our main line at [phone] press option 4 to reach your doctor's medical assistant. If no one answers, please leave a voicemail as directed and we will return your call as soon as possible. Messages left after 4 pm will be answered the following business day.   You may also send u[place] message via [facility] We typically respond to MyChart messages within 1-2 business days.  For prescription refills, please ask your pharmacy to contact our office. Our fax number is [phone]  If you have an urgent issue when the clinic is closed that cannot wait until the next business day, you can page your doctor at the number below.    Please note that while we do our best to be available for urgent issues outside of office hours, we are not available 24/7.   If you have an urgent issue and are unable  to reach [place], you may choose to seek medical care at your doctor's office, retail clinic, urgent care center, or emergency room.  If you have a medical emergency, please immediately call 911 or go to the emergency department.  Pager Numbers  - Dr. Nehemiah Massed: [phone]  - Dr. Laurence Ferrari: [phone]  - Dr. Nicole Kindred: [phone]  In the event of inclement weather, please call our main line at [phone] for an update on the status of any delays or closures.  Dermatology Medication Tips: Please keep the boxes that topical medications come in in order to help keep track of the instructions about where and how to use these. Pharmacies typically print the medication instructions only on the boxes and not directly on the medication tubes.   If your medication is too expensive, please contact our office at [phone] option 4 or send [place] a message through [place].   We are unable to tell what your co-pay for medications will be in advance as this is different depending on your insurance coverage. However, we may be able to find a substitute medication at lower cost or fill out paperwork to get insurance to  cover a needed medication.   If a prior authorization is required to get your medication covered by your insurance company, please allow [place] 1-2 business days to complete this process.  Drug prices often vary depending on where the prescription is filled and some pharmacies may offer cheaper prices.  The website www.goodrx.com contains coupons for medications through different pharmacies. The prices here do not account for what the cost may be with help from insurance (it may be cheaper with your insurance), but the website can give you the price if you did not use any insurance.  - You can print the associated coupon and take it with your prescription to the pharmacy.  - You may also stop by our office during regular business hours and pick up a GoodRx coupon card.  - If you need your prescription sent electronically to a different pharmacy, notify our office through [facility] or by phone at [phone] option 4.     Si Usted Necesita Algo Despus de Su Visita  Tambin puede enviarnos un mensaje a travs de Pharmacist, community. Por lo general respondemos a los mensajes de MyChart en el transcurso de 1 a 2 das hbiles.  Para renovar recetas, por favor pida a su farmacia que se ponga en contacto con nuestra oficina. Harland Dingwall de fax es [place] [phone].  Si tiene un asunto urgente cuando la clnica est cerrada y que no puede esperar hasta el siguiente da hbil, puede llamar/localizar a su doctor(a) al nmero que aparece a continuacin.   Por favor, tenga en cuenta que aunque hacemos todo lo posible para estar disponibles para asuntos urgentes fuera del horario de [place], no estamos disponibles las 24 horas del da, los 7 das de la [place].   Si tiene un problema urgente y no puede comunicarse con nosotros, puede optar por buscar atencin mdica  en el consultorio de su doctor(a), en una clnica privada, en un centro de atencin urgente o en una sala de emergencias.  Si tiene Conservator, museum/gallery, por favor llame inmediatamente al 911 o vaya a la sala de emergencias.  Nmeros de bper  - Dr. Nehemiah Massed: [phone]  - Dra. Moye: [phone]  - Dra. Nicole Kindred: [phone]  En caso de inclemencias del [place], por favor llame a nuestra lnea principal al [phone] para una actualizacin sobre el [place] de  cualquier retraso o cierre.  Consejos para la medicacin en dermatologa: Por favor, guarde las cajas en las que vienen los medicamentos de uso tpico para ayudarle a seguir las instrucciones sobre dnde y cmo usarlos. Las farmacias generalmente imprimen las instrucciones del medicamento slo en las cajas y no directamente en los tubos del [place].   Si su medicamento es muy caro, por favor, pngase en contacto con Zigmund Daniel llamando al [phone] y presione la opcin 4 o envenos un mensaje a travs de Pharmacist, community.   No podemos decirle cul ser su copago por los medicamentos por adelantado ya que esto es diferente dependiendo de la cobertura de su seguro. Sin embargo, es posible que podamos encontrar un medicamento sustituto a Electrical engineer un formulario para que el seguro cubra el medicamento que se considera necesario.   Si se requiere una autorizacin previa para que su compaa de seguros [place] su medicamento, por favor permtanos de 1 a 2 das hbiles para completar este proceso.  Los precios de los medicamentos varan con frecuencia dependiendo del Environmental consultant de dnde se surte la receta y alguna farmacias pueden ofrecer precios ms baratos.  El sitio web www.goodrx.com tiene cupones para medicamentos de Airline pilot. Los precios aqu no tienen en cuenta lo que podra costar con la ayuda del seguro (puede ser ms barato con su seguro), pero el sitio web puede darle el precio si no utiliz Research scientist (physical sciences).  - Puede imprimir el cupn correspondiente y llevarlo con su receta a la farmacia.  - Tambin puede pasar por nuestra oficina durante el  horario de atencin regular y Charity fundraiser una tarjeta de cupones de GoodRx.  - Si necesita que su receta se enve electrnicamente a una farmacia diferente, informe a nuestra oficina a travs de MyChart de [place] o por telfono llamando al [phone] y presione la opcin 4.

## 2022-03-31 ENCOUNTER — Ambulatory Visit (INDEPENDENT_AMBULATORY_CARE_PROVIDER_SITE_OTHER): Payer: Medicare HMO | Admitting: Psychology

## 2022-03-31 DIAGNOSIS — F3289 Other specified depressive episodes: Secondary | ICD-10-CM | POA: Diagnosis not present

## 2022-03-31 NOTE — Progress Notes (Signed)
[facility] Counselor/Therapist Progress Note  Patient ID: LACONDA HERSKOVITZ, MRN: [id number]   Date: 03/31/22  Time Spent: 11:03 am- 11:53 pm : 50 Minutes  Treatment Type: Individual Therapy.  Reported Symptoms: depression and anxiety.   Mental Status Exam: Appearance:  Casual     Behavior: Appropriate  Motor: Normal  Speech/Language:  Normal Rate  Affect: Appropriate and Congruent  Mood: normal  Thought process: normal  Thought content:   WNL  Sensory/Perceptual disturbances:   WNL  Orientation: oriented to person, place, time/date, and situation  Attention: Good  Concentration: Good  Memory: WNL  Fund of knowledge:  Good  Insight:   Good  Judgment:  Good  Impulse Control: Good   Risk Assessment: Danger to Self:  No Self-injurious Behavior: No Danger to Others: No Duty to Warn:no Physical Aggression / Violence:No  Access to Firearms a concern: No  Gang Involvement:No   In case of a mental health emergency:  85 - confidential suicide hotline. [facility]):        [place], [place] [address]       [phone] 3.   911  4.   Visiting Nearest ED.    Subjective:   [name] participated from car, via video and consented to treatment. Therapist participated from home office. We met online due to [place] pandemic. Driana reviewed the events of the past week. [name] noted things going "not real well". She noted stressors at home due to interpersonal issues. She noted difficulty for her partner to be alone while she travels and noted feeling pressured to stay home in lieu of visiting family. She noted attempting to navigating this using positive and assertive communication. She noted a need to resolve repeated issues to no avail. She noted her interest in couples counseling but noted her partner's refusal to participate.  Therapist encouraged [name] to identify her needs, boundaries, and  to communicate using positive assertive communication skills previously discussed.  Therapist modeled this during the session.  Resources for couples counseling will be provided upon request.  Therapist validated and normalized [name]'s feelings and experience.  Therapist provided supportive therapy.  A follow-up was scheduled for continued treatment.  Interventions: Interpersonal   Diagnosis:  Other depression  Psychiatric Treatment: Yes , via PCP Dr. Caryn Section.   Treatment Plan:  Client Abilities/Strengths Talea is self-ware, motivated for change, and flexible.   Support System: Husband and family.   Client Treatment Preferences Outpatient Therapy.   Client Statement of Needs Latsha would like to engage in enjoyable activities, managing her overall mood and symptoms, improving self-talk, resolve relationship stressors, improve communication and process past events.  Treatment Level Weekly  Symptoms  Depression: loss of interest, feeling down, middle insomnia, lethargy, poor appetite, feeling bad about self, difficulty concentrating,  psycho-motor agitation, no SI.     (Status: maintained) Anxiety: Anxious, difficulty managing worry, worrying about different things, restlessness, easily annoyed, feeling afraid as if something bad might happen.    (Status: maintained)  Goals:   Joyanne experiences symptoms of depression and anxiety.    Target Date: 01/09/23 Frequency: Weekly  Progress: 0 Modality: individual    Therapist will provide referrals for additional resources as appropriate.  Therapist will provide psycho-education regarding Airica's diagnosis and corresponding treatment approaches and interventions. Licensed Clinical Social Worker, [place], LCSW will support the patient's ability to achieve the goals identified. will employ CBT, BA, Problem-solving, Solution Focused, Mindfulness,  coping skills, & other  evidenced-based practices will be used to promote  progress towards healthy functioning to help manage decrease symptoms associated with her diagnosis.   Reduce overall level, frequency, and intensity of the feelings of depression, anxiety and panic evidenced by decreased overall symptoms from 6 to 7 days/week to 0 to 1 days/week per client report for at least 3 consecutive months. Verbally express understanding of the relationship between feelings of depression, anxiety and their impact on thinking patterns and behaviors. Verbalize an understanding of the role that distorted thinking plays in creating fears, excessive worry, and ruminations.   [name] participated in the creation of the treatment plan)   [name], LCSW

## 2022-04-02 ENCOUNTER — Encounter: Payer: Self-pay | Admitting: Dermatology

## 2022-04-07 ENCOUNTER — Ambulatory Visit (INDEPENDENT_AMBULATORY_CARE_PROVIDER_SITE_OTHER): Payer: Medicare HMO | Admitting: Psychology

## 2022-04-07 ENCOUNTER — Ambulatory Visit
Admission: RE | Admit: 2022-04-07 | Discharge: 2022-04-07 | Disposition: A | Discharge status: Home / Self Care | Payer: Medicare HMO | Source: Ambulatory Visit | Attending: Family Medicine | Admitting: Family Medicine

## 2022-04-07 DIAGNOSIS — Z1231 Encounter for screening mammogram for malignant neoplasm of breast: Secondary | ICD-10-CM | POA: Insufficient documentation

## 2022-04-07 DIAGNOSIS — F3289 Other specified depressive episodes: Secondary | ICD-10-CM

## 2022-04-07 NOTE — Progress Notes (Signed)
[facility] Counselor/Therapist Progress Note  Patient ID: Sharon Bryan, MRN: [id number]   Date: 04/07/22  Time Spent: 1:05 am-1:56 pm : 51 Minutes  Treatment Type: Individual Therapy.  Reported Symptoms: depression and anxiety.   Mental Status Exam: Appearance:  Casual     Behavior: Appropriate  Motor: Normal  Speech/Language:  Normal Rate  Affect: Appropriate and Congruent  Mood: dysthymic  Thought process: normal  Thought content:   WNL  Sensory/Perceptual disturbances:   WNL  Orientation: oriented to person, place, time/date, and situation  Attention: Good  Concentration: Good  Memory: WNL  Fund of knowledge:  Good  Insight:   Good  Judgment:  Good  Impulse Control: Good   Risk Assessment: Danger to Self:  No Self-injurious Behavior: No Danger to Others: No Duty to Warn:no Physical Aggression / Violence:No  Access to Firearms a concern: No  Gang Involvement:No   In case of a mental health emergency:  34 - confidential suicide hotline. [facility]):        [place], [place] [address]       [phone] 3.   911  4.   Visiting Nearest ED.    Subjective:   [name] participated from car, via video and consented to treatment. We experienced technical difficulties and switched to audio. Therapist participated from home office. We met online due to [place] pandemic. Alyria reviewed the events of the past week. [name] noted some improvement in the past few days. She noted working on identifying and setting boundaries with her husband and noted communicating not wanting to choose between him and her children. She noted sleeping separately since her trip. She noted a need to reflect on the relationship and build and maintain boundaries. She noted needing time to be introspective. Therapist supported this and encouraged [name] to take time to decide what she needs and to communicate  clearly, assertively, and to take time to think through decisions. Therapist validated and normalized [name]'s feelings and experience.  Therapist provided supportive therapy.  A follow-up was scheduled for continued treatment.  Interventions: Interpersonal   Diagnosis:  Other depression  Psychiatric Treatment: Yes , via PCP Dr. Caryn Section.   Treatment Plan:  Client Abilities/Strengths Delon is self-ware, motivated for change, and flexible.   Support System: Husband and family.   Client Treatment Preferences Outpatient Therapy.   Client Statement of Needs Jeantte would like to engage in enjoyable activities, managing her overall mood and symptoms, improving self-talk, resolve relationship stressors, improve communication and process past events.  Treatment Level Weekly  Symptoms  Depression: loss of interest, feeling down, middle insomnia, lethargy, poor appetite, feeling bad about self, difficulty concentrating,  psycho-motor agitation, no SI.     (Status: maintained) Anxiety: Anxious, difficulty managing worry, worrying about different things, restlessness, easily annoyed, feeling afraid as if something bad might happen.    (Status: maintained)  Goals:   Crystan experiences symptoms of depression and anxiety.    Target Date: 01/09/23 Frequency: Weekly  Progress: 0 Modality: individual    Therapist will provide referrals for additional resources as appropriate.  Therapist will provide psycho-education regarding Malina's diagnosis and corresponding treatment approaches and interventions. Licensed Clinical Social Worker, [place], LCSW will support the patient's ability to achieve the goals identified. will employ CBT, BA, Problem-solving, Solution Focused, Mindfulness,  coping skills, & other evidenced-based practices will be used to promote progress towards healthy functioning to help manage decrease symptoms associated with her  diagnosis.   Reduce overall level,  frequency, and intensity of the feelings of depression, anxiety and panic evidenced by decreased overall symptoms from 6 to 7 days/week to 0 to 1 days/week per client report for at least 3 consecutive months. Verbally express understanding of the relationship between feelings of depression, anxiety and their impact on thinking patterns and behaviors. Verbalize an understanding of the role that distorted thinking plays in creating fears, excessive worry, and ruminations.   [name] participated in the creation of the treatment plan)   [name], LCSW

## 2022-04-07 NOTE — Progress Notes (Signed)
I,Sharon Bryan,acting as a Education administrator for Sharon Huh, MD.,have documented all relevant documentation on the behalf of Sharon Huh, MD,as directed by  Sharon Huh, MD while in the presence of Sharon Huh, MD.   MyChart Video Visit    Virtual Visit via Video Note   This format is felt to be most appropriate for this patient at this time. Physical exam was limited by quality of the video and audio technology used for the visit.   Patient location: home Provider location: [facility]  I discussed the limitations of evaluation and management by telemedicine and the availability of in person appointments. The patient expressed understanding and agreed to proceed.  Patient: Sharon Bryan   DOB: 1965-09-10   57 y.o. Female  MRN: [id number] Visit Date: 04/08/2022  Today's healthcare provider: Lelon Huh, MD   No chief complaint on file.  Subjective    HPI  Knee Pain: Complaints of persistent bilateral knee pain interfering with ambulation and activity.  Patient would a referral to Ortho.No swelling. Left knee feels like something inside of joint. Xrays May 2023 showed mild medial joint space narrowing and osteophytosis with tiny knee joint effusions. Right knee is not as severe as it was at that time, but is now affecting both knees equally.    Medications: Outpatient Medications Prior to Visit  Medication Sig   ALPRAZolam (XANAX) 1 MG tablet Take 1/2 tablet once or twice a day as needed for anxiety and panic attacks   amitriptyline (ELAVIL) 25 MG tablet TAKE 2 TO 3 TABLETS(50 TO 75 MG) BY MOUTH AT BEDTIME (Patient taking differently: Taking prn, only about once a month for sleep)   Aspirin-Acetaminophen-Caffeine (EXCEDRIN PO) Take 1 tablet daily as needed by mouth.   busPIRone (BUSPAR) 30 MG tablet TAKE 1 TABLET BY MOUTH TWICE DAILY   DULoxetine (CYMBALTA) 60 MG capsule Take 1 capsule (60 mg total) by mouth daily.   estradiol (ESTRACE) 2 MG tablet TAKE  1 TABLET(2 MG) BY MOUTH DAILY   fluticasone (FLONASE) 50 MCG/ACT nasal spray Place 2 sprays into both nostrils daily.   lamoTRIgine (LAMICTAL) 100 MG tablet TAKE 1 TABLET BY MOUTH TWICE DAILY   levothyroxine (SYNTHROID) 75 MCG tablet TAKE 1 TABLET(75 MCG) BY MOUTH DAILY   Magnesium Oxide 420 MG TABS Take 400 mg by mouth daily.   mometasone (ELOCON) 0.1 % cream Apply twice daily to affected body areas 5 days per week as needed. Avoid applying to face, groin, and axilla   Multiple Vitamins-Minerals (CENTRUM SILVER PO) Take by mouth.   nabumetone (RELAFEN) 750 MG tablet TAKE 1 TO 2 TABLETS(750 TO 1500 MG) BY MOUTH DAILY AS NEEDED FOR LEG AND KNEE PAIN   neomycin-polymyxin-hydrocortisone (CORTISPORIN) 3.5-10000-1 OTIC suspension SHAKE LIQUID AND INSTILL 3 TO 4 DROPS TO AFFECTED EAR FOUR TIMES DAILY   omeprazole (PRILOSEC) 20 MG capsule TAKE 1 CAPSULE BY MOUTH EVERY DAY   PARoxetine (PAXIL) 30 MG tablet Take 1 tablet (30 mg total) by mouth daily.   phenytoin (DILANTIN) 100 MG ER capsule TAKE 2 CAPSULES(200 MG) BY MOUTH TWICE DAILY   pregabalin (LYRICA) 225 MG capsule Take 1 capsule (225 mg total) by mouth 2 (two) times daily. (Patient taking differently: Take 225 mg by mouth 2 (two) times daily. For fibromyalgia)   No facility-administered medications prior to visit.         Objective    LMP 04/26/2002 (LMP Unknown)     Physical Exam  Awake, alert, oriented x 3. In no  apparent distress    Assessment & Plan     1. Primary osteoarthritis of both knees Progressive over last several months interfering with mobility, sleep and ADLs    - Ambulatory referral to Orthopedic Surgery - traMADol (ULTRAM) 50 MG tablet; Take 1 tablet (50 mg total) by mouth every 8 (eight) hours as needed.  Dispense: 15 tablet; Refill: 0        I discussed the assessment and treatment plan with the patient. The patient was provided an opportunity to ask questions and all were answered. The patient agreed with  the plan and demonstrated an understanding of the instructions.   The patient was advised to call back or seek an in-person evaluation if the symptoms worsen or if the condition fails to improve as anticipated.  I provided 9 minutes of non-face-to-face time during this encounter.  The entirety of the information documented in the History of Present Illness, Review of Systems and Physical Exam were personally obtained by me. Portions of this information were initially documented by the CMA and reviewed by me for thoroughness and accuracy.    Sharon Huh, MD [place] [phone] (phone) [phone] (fax)  [place]

## 2022-04-08 ENCOUNTER — Telehealth (INDEPENDENT_AMBULATORY_CARE_PROVIDER_SITE_OTHER): Payer: Medicare HMO | Admitting: Family Medicine

## 2022-04-08 DIAGNOSIS — M170 Bilateral primary osteoarthritis of knee: Secondary | ICD-10-CM

## 2022-04-08 MED ORDER — TRAMADOL HCL 50 MG PO TABS: 50.0000 mg | ORAL_TABLET | Freq: Three times a day (TID) | ORAL | 0 refills | Status: DC | PRN

## 2022-04-11 ENCOUNTER — Encounter: Payer: Self-pay | Admitting: Family Medicine

## 2022-04-14 ENCOUNTER — Telehealth: Payer: Self-pay

## 2022-04-14 ENCOUNTER — Ambulatory Visit (INDEPENDENT_AMBULATORY_CARE_PROVIDER_SITE_OTHER): Payer: Medicare HMO | Admitting: Psychology

## 2022-04-14 DIAGNOSIS — F3289 Other specified depressive episodes: Secondary | ICD-10-CM | POA: Diagnosis not present

## 2022-04-14 NOTE — Telephone Encounter (Signed)
Copied from [place] [id number]. Topic: General - Other >> Apr 14, 2022  9:26 AM [name] wrote: Reason for CRM: The patient's husband has called to request contact with Judson Roch C when possible  Please contact further when available

## 2022-04-14 NOTE — Progress Notes (Signed)
[facility] Counselor/Therapist Progress Note  Patient ID: Sharon Bryan, MRN: [id number]   Date: 04/14/22  Time Spent: 2:04 am-2:59 pm : 55 Minutes  Treatment Type: Individual Therapy.  Reported Symptoms: depression and anxiety.   Mental Status Exam: Appearance:  Casual     Behavior: Appropriate  Motor: Normal  Speech/Language:  Normal Rate  Affect: Appropriate and Congruent  Mood: dysthymic  Thought process: normal  Thought content:   WNL  Sensory/Perceptual disturbances:   WNL  Orientation: oriented to person, place, time/date, and situation  Attention: Good  Concentration: Good  Memory: WNL  Fund of knowledge:  Good  Insight:   Good  Judgment:  Good  Impulse Control: Good   Risk Assessment: Danger to Self:  No Self-injurious Behavior: No Danger to Others: No Duty to Warn:no Physical Aggression / Violence:No  Access to Firearms a concern: No  Gang Involvement:No   In case of a mental health emergency:  30 - confidential suicide hotline. [facility]):        [place], [place] [address]       [phone] 3.   911  4.   Visiting Nearest ED.    Subjective:   Sharon Bryan participated from home, via video and consented to treatment. Therapist participated from home office. We met online due to [place] pandemic. Sharon Bryan reviewed the events of the past week. She noted frustration regarding her partner's behavior, lack of follow-through, and difficulty resolving this issue. She noted experiencing disappointment after disappointment. She noted frustration regarding the lack of follow through on numerous commitments and promises. She noted a need for a change. WE worked on identifying boundaries, what she would like to do going forward, and ways to communicate her own needs and hold her partner accountable. She noted interest in taking a break, a long visit with family, to process upcoming  steps. We explored and processed this during the session. Therapist highlighted negative self-talk "I am a failure" in relation to the relationship. We explored this during the session. Therapist challenged this during the session using evidence and encouraged mindfulness regarding self-talk.Therapist validated and normalized Sharon Bryan's feelings and experience.  Therapist provided supportive therapy.  A follow-up was scheduled for continued treatment.  Interventions: Interpersonal & CBT  Diagnosis:  Other depression  Psychiatric Treatment: Yes , via PCP Dr. Caryn Section.   Treatment Plan:  Client Abilities/Strengths Sharon Bryan is self-ware, motivated for change, and flexible.   Support System: Husband and family.   Client Treatment Preferences Outpatient Therapy.   Client Statement of Needs Sharon Bryan would like to engage in enjoyable activities, managing her overall mood and symptoms, improving self-talk, resolve relationship stressors, improve communication and process past events.  Treatment Level Weekly  Symptoms  Depression: loss of interest, feeling down, middle insomnia, lethargy, poor appetite, feeling bad about self, difficulty concentrating,  psycho-motor agitation, no SI.     (Status: maintained) Anxiety: Anxious, difficulty managing worry, worrying about different things, restlessness, easily annoyed, feeling afraid as if something bad might happen.    (Status: maintained)  Goals:   Sharon Bryan experiences symptoms of depression and anxiety.    Target Date: 01/09/23 Frequency: Weekly  Progress: 0 Modality: individual    Therapist will provide referrals for additional resources as appropriate.  Therapist will provide psycho-education regarding Sharon Bryan's diagnosis and corresponding treatment approaches and interventions. Licensed Clinical Social Worker, [place], LCSW will support the patient's ability to achieve the goals identified. will employ CBT, BA,  Problem-solving,  Solution Focused, Mindfulness,  coping skills, & other evidenced-based practices will be used to promote progress towards healthy functioning to help manage decrease symptoms associated with her diagnosis.   Reduce overall level, frequency, and intensity of the feelings of depression, anxiety and panic evidenced by decreased overall symptoms from 6 to 7 days/week to 0 to 1 days/week per client report for at least 3 consecutive months. Verbally express understanding of the relationship between feelings of depression, anxiety and their impact on thinking patterns and behaviors. Verbalize an understanding of the role that distorted thinking plays in creating fears, excessive worry, and ruminations.   Sharon Bryan participated in the creation of the treatment plan)   [name], LCSW

## 2022-04-15 ENCOUNTER — Other Ambulatory Visit: Payer: Self-pay | Admitting: Family Medicine

## 2022-04-15 ENCOUNTER — Telehealth: Payer: Self-pay | Admitting: Family Medicine

## 2022-04-15 DIAGNOSIS — M170 Bilateral primary osteoarthritis of knee: Secondary | ICD-10-CM

## 2022-04-15 MED ORDER — TRAMADOL HCL 50 MG PO TABS: 50.0000 mg | ORAL_TABLET | Freq: Three times a day (TID) | ORAL | 1 refills | Status: DC | PRN

## 2022-04-15 NOTE — Telephone Encounter (Signed)
Medication Refill - Medication: traMADol (ULTRAM) 50 MG tablet [id number]   Pt husband is calling to request more medication. Pt has 12 pills left. Trying to be proactive. Awaiting referral to [facility].  Has the patient contacted their pharmacy? No. (Agent: If no, request that the patient contact the pharmacy for the refill. If patient does not wish to contact the pharmacy document the reason why and proceed with request.) (Agent: If yes, when and what did the pharmacy advise?)  Preferred Pharmacy (with phone number or street name): [facility] [place], [place] - [address] AT [place] [address], [place] [address] Phone: [phone]  Fax: [phone]  Has the patient been seen for an appointment in the last year OR does the patient have an upcoming appointment? Yes.    Agent: Please be advised that RX refills may take up to 3 business days. We ask that you follow-up with your pharmacy.

## 2022-04-15 NOTE — Telephone Encounter (Signed)
Pts husband is calling to request if referral can be changed from [facility] to [facility]. Please advise [phone]- [phone]

## 2022-04-18 ENCOUNTER — Ambulatory Visit (INDEPENDENT_AMBULATORY_CARE_PROVIDER_SITE_OTHER): Payer: Medicare HMO

## 2022-04-18 VITALS — Ht 62.0 in | Wt 258.0 lb

## 2022-04-18 DIAGNOSIS — Z0000 Encounter for general adult medical examination without abnormal findings: Secondary | ICD-10-CM

## 2022-04-18 NOTE — Patient Instructions (Signed)
Sharon Bryan , Thank you for taking time to come for your Medicare Wellness Visit. I appreciate your ongoing commitment to your health goals. Please review the following plan we discussed and let me know if I can assist you in the future.   These are the goals we discussed:  Goals      DIET - EAT MORE FRUITS AND VEGETABLES        This is a list of the screening recommended for you and due dates:  Health Maintenance  Topic Date Due   HIV Screening  Never done   Zoster (Shingles) Vaccine (1 of 2) Never done   Pap Smear  Never done   DTaP/Tdap/Td vaccine (3 - Td or Tdap) 01/05/2021   COVID-19 Vaccine (4 - 2023-24 season) 09/24/2021   Colon Cancer Screening  11/08/2022   Medicare Annual Wellness Visit  04/18/2023   Mammogram  04/06/2024   Flu Shot  Completed   HPV Vaccine  Aged Out    Advanced directives: yes  Conditions/risks identified: falls risk  Next appointment: Follow up in one year for your annual wellness visit. 04/19/2023 @10 :15am telephone  Preventive Care 40-64 Years, Female Preventive care refers to lifestyle choices and visits with your health care provider that can promote health and wellness. What does preventive care include? A yearly physical exam. This is also called an annual well check. Dental exams once or twice a year. Routine eye exams. Ask your health care provider how often you should have your eyes checked. Personal lifestyle choices, including: Daily care of your teeth and gums. Regular physical activity. Eating a healthy diet. Avoiding tobacco and drug use. Limiting alcohol use. Practicing safe sex. Taking low-dose aspirin daily starting at age 28. Taking vitamin and mineral supplements as recommended by your health care provider. What happens during an annual well check? The services and screenings done by your health care provider during your annual well check will depend on your age, overall health, lifestyle risk factors, and family  history of disease. Counseling  Your health care provider may ask you questions about your: Alcohol use. Tobacco use. Drug use. Emotional well-being. Home and relationship well-being. Sexual activity. Eating habits. Work and work Statistician. Method of birth control. Menstrual cycle. Pregnancy history. Screening  You may have the following tests or measurements: Height, weight, and BMI. Blood pressure. Lipid and cholesterol levels. These may be checked every 5 years, or more frequently if you are over 5 years old. Skin check. Lung cancer screening. You may have this screening every year starting at age 35 if you have a 30-pack-year history of smoking and currently smoke or have quit within the past 15 years. Fecal occult blood test (FOBT) of the stool. You may have this test every year starting at age 77. Flexible sigmoidoscopy or colonoscopy. You may have a sigmoidoscopy every 5 years or a colonoscopy every 10 years starting at age 54. Hepatitis C blood test. Hepatitis B blood test. Sexually transmitted disease (STD) testing. Diabetes screening. This is done by checking your blood sugar (glucose) after you have not eaten for a while (fasting). You may have this done every 1-3 years. Mammogram. This may be done every 1-2 years. Talk to your health care provider about when you should start having regular mammograms. This may depend on whether you have a family history of breast cancer. BRCA-related cancer screening. This may be done if you have a family history of breast, ovarian, tubal, or peritoneal cancers. Pelvic exam and Pap  test. This may be done every 3 years starting at age 75. Starting at age 38, this may be done every 5 years if you have a Pap test in combination with an HPV test. Bone density scan. This is done to screen for osteoporosis. You may have this scan if you are at high risk for osteoporosis. Discuss your test results, treatment options, and if necessary, the need  for more tests with your health care provider. Vaccines  Your health care provider may recommend certain vaccines, such as: Influenza vaccine. This is recommended every year. Tetanus, diphtheria, and acellular pertussis (Tdap, Td) vaccine. You may need a Td booster every 10 years. Zoster vaccine. You may need this after age 88. Pneumococcal 13-valent conjugate (PCV13) vaccine. You may need this if you have certain conditions and were not previously vaccinated. Pneumococcal polysaccharide (PPSV23) vaccine. You may need one or two doses if you smoke cigarettes or if you have certain conditions. Talk to your health care provider about which screenings and vaccines you need and how often you need them. This information is not intended to replace advice given to you by your health care provider. Make sure you discuss any questions you have with your health care provider. Document Released: 02/06/2015 Document Revised: 09/30/2015 Document Reviewed: 11/11/2014 Elsevier Interactive Patient Education  2017 [employer] Prevention in the Home Falls can cause injuries. They can happen to people of all ages. There are many things you can do to make your home safe and to help prevent falls. What can I do on the outside of my home? Regularly fix the edges of walkways and driveways and fix any cracks. Remove anything that might make you trip as you walk through a door, such as a raised step or threshold. Trim any bushes or trees on the path to your home. Use bright outdoor lighting. Clear any walking paths of anything that might make someone trip, such as rocks or tools. Regularly check to see if handrails are loose or broken. Make sure that both sides of any steps have handrails. Any raised decks and porches should have guardrails on the edges. Have any leaves, snow, or ice cleared regularly. Use sand or salt on walking paths during winter. Clean up any spills in your garage right away. This  includes oil or grease spills. What can I do in the bathroom? Use night lights. Install grab bars by the toilet and in the tub and shower. Do not use towel bars as grab bars. Use non-skid mats or decals in the tub or shower. If you need to sit down in the shower, use a plastic, non-slip stool. Keep the floor dry. Clean up any water that spills on the floor as soon as it happens. Remove soap buildup in the tub or shower regularly. Attach bath mats securely with double-sided non-slip rug tape. Do not have throw rugs and other things on the floor that can make you trip. What can I do in the bedroom? Use night lights. Make sure that you have a light by your bed that is easy to reach. Do not use any sheets or blankets that are too big for your bed. They should not hang down onto the floor. Have a firm chair that has side arms. You can use this for support while you get dressed. Do not have throw rugs and other things on the floor that can make you trip. What can I do in the kitchen? Clean up any spills  right away. Avoid walking on wet floors. Keep items that you use a lot in easy-to-reach places. If you need to reach something above you, use a strong step stool that has a grab bar. Keep electrical cords out of the way. Do not use floor polish or wax that makes floors slippery. If you must use wax, use non-skid floor wax. Do not have throw rugs and other things on the floor that can make you trip. What can I do with my stairs? Do not leave any items on the stairs. Make sure that there are handrails on both sides of the stairs and use them. Fix handrails that are broken or loose. Make sure that handrails are as long as the stairways. Check any carpeting to make sure that it is firmly attached to the stairs. Fix any carpet that is loose or worn. Avoid having throw rugs at the top or bottom of the stairs. If you do have throw rugs, attach them to the floor with carpet tape. Make sure that you  have a light switch at the top of the stairs and the bottom of the stairs. If you do not have them, ask someone to add them for you. What else can I do to help prevent falls? Wear shoes that: Do not have high heels. Have rubber bottoms. Are comfortable and fit you well. Are closed at the toe. Do not wear sandals. If you use a stepladder: Make sure that it is fully opened. Do not climb a closed stepladder. Make sure that both sides of the stepladder are locked into place. Ask someone to hold it for you, if possible. Clearly mark and make sure that you can see: Any grab bars or handrails. First and last steps. Where the edge of each step is. Use tools that help you move around (mobility aids) if they are needed. These include: Canes. Walkers. Scooters. Crutches. Turn on the lights when you go into a dark area. Replace any light bulbs as soon as they burn out. Set up your furniture so you have a clear path. Avoid moving your furniture around. If any of your floors are uneven, fix them. If there are any pets around you, be aware of where they are. Review your medicines with your doctor. Some medicines can make you feel dizzy. This can increase your chance of falling. Ask your doctor what other things that you can do to help prevent falls. This information is not intended to replace advice given to you by your health care provider. Make sure you discuss any questions you have with your health care provider. Document Released: 11/06/2008 Document Revised: 06/18/2015 Document Reviewed: 02/14/2014 Elsevier Interactive Patient Education  2017 [employer].

## 2022-04-18 NOTE — Progress Notes (Signed)
I connected with  Sharon Bryan on 04/18/22 by a audio enabled telemedicine application and verified that I am speaking with the correct person using two identifiers.  Patient Location: Home  Provider Location: Office/Clinic  I discussed the limitations of evaluation and management by telemedicine. The patient expressed understanding and agreed to proceed.  Subjective:   Sharon Bryan is a 57 y.o. female who presents for Medicare Annual (Subsequent) preventive examination.  Review of Systems    Cardiac Risk Factors include: obesity (BMI >30kg/m2);sedentary lifestyle    Objective:    Today's Vitals   04/18/22 1052 04/18/22 1054  Weight: 258 lb (117 kg)   Height: 5\' 2"  (1.575 m)   PainSc:  7    Body mass index is 47.19 kg/m.     04/18/2022   11:06 AM 04/13/2021    3:53 PM 11/07/2017    1:40 PM  Advanced Directives  Does Patient Have a Medical Advance Directive? Yes No No  Would patient like information on creating a medical advance directive?  No - Patient declined No - Patient declined    Current Medications (verified) Outpatient Encounter Medications as of 04/18/2022  Medication Sig   ALPRAZolam (XANAX) 1 MG tablet Take 1/2 tablet once or twice a day as needed for anxiety and panic attacks   amitriptyline (ELAVIL) 25 MG tablet TAKE 2 TO 3 TABLETS(50 TO 75 MG) BY MOUTH AT BEDTIME (Patient taking differently: Taking prn, only about once a month for sleep)   Aspirin-Acetaminophen-Caffeine (EXCEDRIN PO) Take 1 tablet daily as needed by mouth.   busPIRone (BUSPAR) 30 MG tablet TAKE 1 TABLET BY MOUTH TWICE DAILY   DULoxetine (CYMBALTA) 60 MG capsule Take 1 capsule (60 mg total) by mouth daily.   estradiol (ESTRACE) 2 MG tablet TAKE 1 TABLET(2 MG) BY MOUTH DAILY   fluticasone (FLONASE) 50 MCG/ACT nasal spray Place 2 sprays into both nostrils daily.   lamoTRIgine (LAMICTAL) 100 MG tablet TAKE 1 TABLET BY MOUTH TWICE DAILY   levothyroxine (SYNTHROID) 75 MCG tablet  TAKE 1 TABLET(75 MCG) BY MOUTH DAILY   Magnesium Oxide 420 MG TABS Take 400 mg by mouth daily.   mometasone (ELOCON) 0.1 % cream Apply twice daily to affected body areas 5 days per week as needed. Avoid applying to face, groin, and axilla   Multiple Vitamins-Minerals (CENTRUM SILVER PO) Take by mouth.   nabumetone (RELAFEN) 750 MG tablet TAKE 1 TO 2 TABLETS(750 TO 1500 MG) BY MOUTH DAILY AS NEEDED FOR LEG AND KNEE PAIN   neomycin-polymyxin-hydrocortisone (CORTISPORIN) 3.5-10000-1 OTIC suspension SHAKE LIQUID AND INSTILL 3 TO 4 DROPS TO AFFECTED EAR FOUR TIMES DAILY   omeprazole (PRILOSEC) 20 MG capsule TAKE 1 CAPSULE BY MOUTH EVERY DAY   PARoxetine (PAXIL) 30 MG tablet Take 1 tablet (30 mg total) by mouth daily.   phenytoin (DILANTIN) 100 MG ER capsule TAKE 2 CAPSULES(200 MG) BY MOUTH TWICE DAILY   pregabalin (LYRICA) 225 MG capsule Take 1 capsule (225 mg total) by mouth 2 (two) times daily. (Patient taking differently: Take 225 mg by mouth 2 (two) times daily. For fibromyalgia)   traMADol (ULTRAM) 50 MG tablet Take 1 tablet (50 mg total) by mouth every 8 (eight) hours as needed.   No facility-administered encounter medications on file as of 04/18/2022.    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 Burke Keels. Partial, ovaries remain   CESAREAN SECTION  01/25/1996   COLONOSCOPY WITH PROPOFOL N/A 11/07/2017   Procedure: COLONOSCOPY WITH PROPOFOL;  Surgeon: Lin Landsman, MD;  Location: [facility];  Service: Gastroenterology;  Laterality: N/A;   CRANIOTOMY   01/25/2003   for artetiovenius malformation   TUBAL LIGATION  01/25/1996   Family History  Problem Relation Age of Onset   Hypertension Mother    Arthritis Mother    Breast cancer Sister 24   Breast cancer Other 62       neice   Breast cancer Other        mcousin   Social History   Socioeconomic History   Marital status: Divorced    Spouse name: Not on file   Number of children: Not on file   Years of education: Not on file   Highest education level: Not on file  Occupational History   Not on file  Tobacco Use   Smoking status: Former    Types: Cigarettes    Quit date: 01/25/2007    Years since quitting: 15.2   Smokeless tobacco: Never   Tobacco comments:    Smoked for about 15 years, smoked about 1 pack a day.  Vaping Use   Vaping Use: Never used  Substance and Sexual Activity   Alcohol use: Yes    Comment: occasional use; 1-2 times a year   Drug use: No   Sexual activity: Not on file  Other Topics Concern   Not on file  Social History Narrative   Not on file   Social Determinants of Health   Financial Resource Strain: Low Risk  (04/18/2022)   Overall Financial Resource Strain (CARDIA)    Difficulty of Paying Living Expenses: Not hard at all  Food Insecurity: No Food Insecurity (04/18/2022)   Hunger Vital Sign    Worried About Running Out of Food in the Last Year: Never true    Ran Out of Food in the Last Year: Never true  Transportation Needs: No Transportation Needs (04/18/2022)   PRAPARE - Hydrologist (Medical): No    Lack of Transportation (Non-Medical): No  Physical Activity: Insufficiently Active (04/18/2022)   Exercise Vital Sign    Days of Exercise per Week: 2 days    Minutes of Exercise per Session: 20 min  Stress: No Stress Concern Present (04/18/2022)   [place]    Feeling of Stress : Not at all  Social Connections: Moderately Isolated (04/18/2022)    Social Connection and Isolation Panel [NHANES]    Frequency of Communication with Friends and Family: More than three times a week    Frequency of Social Gatherings with Friends and Family: Twice a week    Attends Religious Services: Never    Marine scientist or Organizations: No    Attends Music therapist: Never    Marital Status: Living with partner    Tobacco Counseling Counseling given: Not Answered Tobacco comments: Smoked for about 15 years, smoked about 1 pack a day.   Clinical Intake:  Pre-visit preparation completed: Yes  Pain :  0-10 Pain Score: 7  Pain Type: Chronic pain Pain Location: Knee (both knees) Pain Descriptors / Indicators: Pressure, Aching Pain Onset: More than a month ago Pain Relieving Factors: tramadol, heat  Pain Relieving Factors: tramadol, heat  BMI - recorded: 47.19 Nutritional Status: BMI > 30  Obese Nutritional Risks: Nausea/ vomitting/ diarrhea (nausea several times /week:no eat enough and meds sometimes) Diabetes: No  How often do you need to have someone help you when you read instructions, pamphlets, or other written materials from your doctor or pharmacy?: 1 - Never  Diabetic?no  Interpreter Needed?: No  Comments: lives with husband Information entered by :: B.Jary Louvier,LPN   Activities of Daily Living    04/18/2022   11:08 AM 03/14/2022   11:22 AM  In your present state of health, do you have any difficulty performing the following activities:  Hearing? 0 0  Vision? 0 0  Difficulty concentrating or making decisions? 0 0  Walking or climbing stairs? 1 0  Dressing or bathing? 0 0  Doing errands, shopping? 0 0  Preparing Food and eating ? N   Using the Toilet? N   In the past six months, have you accidently leaked urine? N   Do you have problems with loss of bowel control? N   Managing your Medications? N   Managing your Finances? N   Housekeeping or managing your Housekeeping? Y     Patient Care  Team: Birdie Sons, MD as PCP - General (Family Medicine)  Indicate any recent Medical Services you may have received from other than [facility] providers in the past year (date may be approximate).     Assessment:   This is a routine wellness examination for Aasia.  Hearing/Vision screen Hearing Screening - Comments:: Adequate hearing Vision Screening - Comments:: Adequate vision w/glasses Can't remember appt this year  Dietary issues and exercise activities discussed: Current Exercise Habits: Home exercise routine, Type of exercise: walking, Time (Minutes): 20, Frequency (Times/Week): 3, Weekly Exercise (Minutes/Week): 60, Intensity: Mild, Exercise limited by: orthopedic condition(s)   Goals Addressed             This Visit's Progress    DIET - EAT MORE FRUITS AND VEGETABLES   On track      Depression Screen    04/18/2022   11:04 AM 03/14/2022   11:21 AM 01/04/2022    8:30 AM 12/10/2021    8:27 AM 11/16/2021    2:35 PM 10/19/2021    3:20 PM 04/13/2021    3:50 PM  PHQ 2/9 Scores  PHQ - 2 Score 0 0 4 6  1 1   PHQ- 9 Score  6 12 19  7 3      Information is confidential and restricted. Go to Review Flowsheets to unlock data.    Fall Risk    04/18/2022   11:00 AM 03/14/2022   11:21 AM 10/19/2021    3:20 PM 04/13/2021    3:55 PM 02/22/2021    4:13 PM  Fall Risk   Falls in the past year? 0 0 1 0 0  Number falls in past yr: 0 0 1 0 0  Injury with Fall? 0 0 0 0 0  Risk for fall due to : No Fall Risks   No Fall Risks No Fall Risks  Follow up Education provided;Falls prevention discussed   Falls evaluation completed Falls evaluation completed    FALL RISK PREVENTION PERTAINING TO THE HOME:  Any stairs in or around the home? Yes  If  so, are there any without handrails? Yes  Home free of loose throw rugs in walkways, pet beds, electrical cords, etc? Yes  Adequate lighting in your home to reduce risk of falls? Yes   ASSISTIVE DEVICES UTILIZED TO PREVENT FALLS:  Life  alert? No  Use of a cane, walker or w/c? No  Grab bars in the bathroom? No  Shower chair or bench in shower? No  Elevated toilet seat or a handicapped toilet? No   Cognitive Function:        04/18/2022   11:11 AM  6CIT Screen  What Year? 0 points  What month? 0 points  What time? 0 points  Count back from 20 2 points  Months in reverse 2 points  Repeat phrase 6 points  Total Score 10 points    Immunizations Immunization History  Administered Date(s) Administered   Influenza,inj,Quad PF,6+ Mos 11/26/2014, 10/30/2015, 12/01/2016, 10/19/2021   PFIZER(Purple Top)SARS-COV-2 Vaccination 04/15/2019, 05/13/2019, 02/25/2020   Td 01/07/1999   Tdap 01/06/2011    TDAP status: Up to date  Flu Vaccine status: Up to date  Pneumococcal vaccine status: Declined,  Education has been provided regarding the importance of this vaccine but patient still declined. Advised may receive this vaccine at local pharmacy or Health Dept. Aware to provide a copy of the vaccination record if obtained from local pharmacy or Health Dept. Verbalized acceptance and understanding.   Covid-19 vaccine status: Completed vaccines  Qualifies for Shingles Vaccine? Yes   Zostavax completed No   Shingrix Completed?: No.    Education has been provided regarding the importance of this vaccine. Patient has been advised to call insurance company to determine out of pocket expense if they have not yet received this vaccine. Advised may also receive vaccine at local pharmacy or Health Dept. Verbalized acceptance and understanding.  Screening Tests Health Maintenance  Topic Date Due   HIV Screening  Never done   Zoster Vaccines- Shingrix (1 of 2) Never done   PAP SMEAR-Modifier  Never done   DTaP/Tdap/Td (3 - Td or Tdap) 01/05/2021   COVID-19 Vaccine (4 - 2023-24 season) 09/24/2021   COLONOSCOPY (Pts 45-30yrs Insurance coverage will need to be confirmed)  11/08/2022   Medicare Annual Wellness (AWV)  04/18/2023    MAMMOGRAM  04/06/2024   INFLUENZA VACCINE  Completed   HPV VACCINES  Aged Out    Health Maintenance  Health Maintenance Due  Topic Date Due   HIV Screening  Never done   Zoster Vaccines- Shingrix (1 of 2) Never done   PAP SMEAR-Modifier  Never done   DTaP/Tdap/Td (3 - Td or Tdap) 01/05/2021   COVID-19 Vaccine (4 - 2023-24 season) 09/24/2021    Colorectal cancer screening: Type of screening: Colonoscopy. Completed yes. Repeat every 5-10 years  Mammogram status: Completed yes. Repeat every year  Lung Cancer Screening: (Low Dose CT Chest recommended if Age 42-80 years, 30 pack-year currently smoking OR have quit w/in 15years.) does not qualify.   Lung Cancer Screening Referral: no  Additional Screening:  Hepatitis C Screening: does not qualify; Completed yes  Vision Screening: Recommended annual ophthalmology exams for early detection of glaucoma and other disorders of the eye. Is the patient up to date with their annual eye exam?  Yes  Who is the provider or what is the name of the office in which the patient attends annual eye exams? Does not know but appt this year If pt is not established with a provider, would they like to be referred  to a provider to establish care? No .   Dental Screening: Recommended annual dental exams for proper oral hygiene  Community Resource Referral / Chronic Care Management: CRR required this visit?  No   CCM required this visit?  No      Plan:     I have personally reviewed and noted the following in the patient's chart:   Medical and social history Use of alcohol, tobacco or illicit drugs  Current medications and supplements including opioid prescriptions. Patient is not currently taking opioid prescriptions. Functional ability and status Nutritional status Physical activity Advanced directives List of other physicians Hospitalizations, surgeries, and ER visits in previous 12 months Vitals Screenings to include cognitive,  depression, and falls Referrals and appointments  In addition, I have reviewed and discussed with patient certain preventive protocols, quality metrics, and best practice recommendations. A written personalized care plan for preventive services as well as general preventive health recommendations were provided to patient.     Roger Shelter, LPN   [id number]   Nurse Notes: pt says she is doing well. She has no questions or questions.

## 2022-04-22 ENCOUNTER — Ambulatory Visit (INDEPENDENT_AMBULATORY_CARE_PROVIDER_SITE_OTHER): Payer: Medicare HMO | Admitting: Psychology

## 2022-04-22 DIAGNOSIS — F3289 Other specified depressive episodes: Secondary | ICD-10-CM

## 2022-04-22 NOTE — Progress Notes (Signed)
[facility] Counselor/Therapist Progress Note  Patient ID: MARIELLE KLENKE, MRN: [id number]   Date: 04/22/22  Time Spent: 12:38 pm - 1:32 pm :54  Minutes  Treatment Type: Individual Therapy.  Reported Symptoms: depression and anxiety.   Mental Status Exam: Appearance:  Casual     Behavior: Appropriate  Motor: Normal  Speech/Language:  Normal Rate  Affect: Appropriate and Congruent  Mood: dysthymic  Thought process: normal  Thought content:   WNL  Sensory/Perceptual disturbances:   WNL  Orientation: oriented to person, place, time/date, and situation  Attention: Good  Concentration: Good  Memory: WNL  Fund of knowledge:  Good  Insight:   Good  Judgment:  Good  Impulse Control: Good   Risk Assessment: Danger to Self:  No Self-injurious Behavior: No Danger to Others: No Duty to Warn:no Physical Aggression / Violence:No  Access to Firearms a concern: No  Gang Involvement:No   In case of a mental health emergency:  32 - confidential suicide hotline. [facility]):        [place], [place] [address]       [phone] 3.   911  4.   Visiting Nearest ED.    Subjective:   [name] participated from home, via video and consented to treatment. Therapist participated from home office. We met online due to [place] pandemic. Naliya reviewed the events of the past week. She noted communicating concerns to PJ regarding the dynamics of the relationship, the need for change, and setting boundaries. She noted her partner spending a large amount of money without discussion or notice. She discussed a lack of financial transparency and a need. She noted past relationships with similar dynamics and discussed boundary setting being difficult for her. She noted worry about discussing this with PJ due to the possible responses she might receive while feeling discomfort of not saying anything. We explored  this and her worry that she would be a burden to her children should she break up and move away. We worked on identifying any possible evidence of this and challenged this via available evidence. Therapist encouraged additional introspection regarding this. Therapist validated and normalized [name]'s feelings and experience.  Therapist provided supportive therapy.  A follow-up was scheduled for continued treatment.  Interventions: Interpersonal & CBT  Diagnosis:  Other depression  Psychiatric Treatment: Yes , via PCP Dr. Caryn Section.   Treatment Plan:  Client Abilities/Strengths Adaleena is self-ware, motivated for change, and flexible.   Support System: Husband and family.   Client Treatment Preferences Outpatient Therapy.   Client Statement of Needs Humayra would like to engage in enjoyable activities, managing her overall mood and symptoms, improving self-talk, resolve relationship stressors, improve communication and process past events.  Treatment Level Weekly  Symptoms  Depression: loss of interest, feeling down, middle insomnia, lethargy, poor appetite, feeling bad about self, difficulty concentrating,  psycho-motor agitation, no SI.     (Status: maintained) Anxiety: Anxious, difficulty managing worry, worrying about different things, restlessness, easily annoyed, feeling afraid as if something bad might happen.    (Status: maintained)  Goals:   Irelyn experiences symptoms of depression and anxiety.    Target Date: 01/09/23 Frequency: Weekly  Progress: 0 Modality: individual    Therapist will provide referrals for additional resources as appropriate.  Therapist will provide psycho-education regarding Kemiah's diagnosis and corresponding treatment approaches and interventions. Licensed Clinical Social Worker, [place], LCSW will support the patient's ability to achieve the goals identified.  will employ CBT, BA, Problem-solving, Solution Focused, Mindfulness,   coping skills, & other evidenced-based practices will be used to promote progress towards healthy functioning to help manage decrease symptoms associated with her diagnosis.   Reduce overall level, frequency, and intensity of the feelings of depression, anxiety and panic evidenced by decreased overall symptoms from 6 to 7 days/week to 0 to 1 days/week per client report for at least 3 consecutive months. Verbally express understanding of the relationship between feelings of depression, anxiety and their impact on thinking patterns and behaviors. Verbalize an understanding of the role that distorted thinking plays in creating fears, excessive worry, and ruminations.   [name] participated in the creation of the treatment plan)   [name], LCSW

## 2022-04-29 ENCOUNTER — Ambulatory Visit: Payer: Medicare HMO | Admitting: Psychology

## 2022-05-04 ENCOUNTER — Ambulatory Visit (INDEPENDENT_AMBULATORY_CARE_PROVIDER_SITE_OTHER): Payer: Medicare HMO | Admitting: Psychology

## 2022-05-04 DIAGNOSIS — F3289 Other specified depressive episodes: Secondary | ICD-10-CM | POA: Diagnosis not present

## 2022-05-04 NOTE — Progress Notes (Signed)
[facility] Counselor/Therapist Progress Note  Patient ID: Sharon Bryan, MRN: [id number]   Date: 05/04/22  Time Spent: 1:34 pm - 2:25 pm : 51  Minutes  Treatment Type: Individual Therapy.  Reported Symptoms: depression and anxiety.   Mental Status Exam: Appearance:  Casual     Behavior: Appropriate  Motor: Normal  Speech/Language:  Normal Rate  Affect: Appropriate and Congruent  Mood: dysthymic  Thought process: normal  Thought content:   WNL  Sensory/Perceptual disturbances:   WNL  Orientation: oriented to person, place, time/date, and situation  Attention: Good  Concentration: Good  Memory: WNL  Fund of knowledge:  Good  Insight:   Good  Judgment:  Good  Impulse Control: Good   Risk Assessment: Danger to Self:  No Self-injurious Behavior: No Danger to Others: No Duty to Warn:no Physical Aggression / Violence:No  Access to Firearms a concern: No  Gang Involvement:No   In case of a mental health emergency:  58 - confidential suicide hotline. [facility]        [address] [place], [place] [address]       [phone] 3.   911  4.   Visiting Nearest ED.    Subjective:   [name] participated from home, via video and consented to treatment. Therapist participated from home office. We met online due to COVID pandemic. Areesha reviewed the events of the past week. Taeylor noted a recent disagreement with her partner. She noted her work to advocate for self and be assertive. She noted a need to set boundaries. We highlighted a lack of reciprocation in regards to emotional support. We discussed the importance of identifying relationships needs and boundaries to be processed during follow-ups. She noted a need to develop a sense of self which she noted has been difficult due to life stressors relationship strife. She reflected on the beginning of the relationship and how things have changed  over time. She highlighted some of her needs to feel a sense of closeness and feeling prioritized. [name] will work on a list of needs in a relationship to be processed going forward. Therapist validated and normalized [name]'s feelings and experience.  Therapist provided supportive therapy.  She continues to benefit from treatment.   Interventions: Interpersonal  Diagnosis:  Other depression  Psychiatric Treatment: Yes , via PCP Dr. Sherrie Mustache.   Treatment Plan:  Client Abilities/Strengths Chesa is self-ware, motivated for change, and flexible.   Support System: Husband and family.   Client Treatment Preferences Outpatient Therapy.   Client Statement of Needs Alexyz would like to engage in enjoyable activities, managing her overall mood and symptoms, improving self-talk, resolve relationship stressors, improve communication and process past events.  Treatment Level Weekly  Symptoms  Depression: loss of interest, feeling down, middle insomnia, lethargy, poor appetite, feeling bad about self, difficulty concentrating,  psycho-motor agitation, no SI.     (Status: maintained) Anxiety: Anxious, difficulty managing worry, worrying about different things, restlessness, easily annoyed, feeling afraid as if something bad might happen.    (Status: maintained)  Goals:   Dariah experiences symptoms of depression and anxiety.    Target Date: 01/09/23 Frequency: Weekly  Progress: 0 Modality: individual    Therapist will provide referrals for additional resources as appropriate.  Therapist will provide psycho-education regarding Vaneta's diagnosis and corresponding treatment approaches and interventions. Licensed Clinical Social Worker, [place], LCSW will support the patient's ability to achieve the goals identified. will employ CBT, BA, Problem-solving,  Solution Focused, Mindfulness,  coping skills, & other evidenced-based practices will be used to promote progress towards  healthy functioning to help manage decrease symptoms associated with her diagnosis.   Reduce overall level, frequency, and intensity of the feelings of depression, anxiety and panic evidenced by decreased overall symptoms from 6 to 7 days/week to 0 to 1 days/week per client report for at least 3 consecutive months. Verbally express understanding of the relationship between feelings of depression, anxiety and their impact on thinking patterns and behaviors. Verbalize an understanding of the role that distorted thinking plays in creating fears, excessive worry, and ruminations.   [name] participated in the creation of the treatment plan)   [name], LCSW

## 2022-05-11 ENCOUNTER — Encounter: Payer: Self-pay | Admitting: Family Medicine

## 2022-05-11 ENCOUNTER — Telehealth: Payer: Self-pay

## 2022-05-11 DIAGNOSIS — G40909 Epilepsy, unspecified, not intractable, without status epilepticus: Secondary | ICD-10-CM

## 2022-05-11 MED ORDER — PHENYTOIN SODIUM EXTENDED 100 MG PO CAPS: ORAL_CAPSULE | ORAL | 2 refills | Status: DC

## 2022-05-11 NOTE — Telephone Encounter (Signed)
Lab in February indicate that it was increased to 2 in the am and 3 in the evening.  Before I call the patient I wanted to check if you needed to do another level and is it ok to refill at that dosing.    Copied from CRM [id number]. Topic: General - Other >> May 11, 2022  8:08 AM [name] wrote: Reason for CRM: The patient's husband shares that their phenytoin (DILANTIN) 100 MG ER capsule [[id number]] prescription has been recently increased by their PCP with instructions to take 5 capsules daily   The patient is currently out of the medication due to the recent increase instructions   The patient's husband would like to be contacted by a member of clinical staff when possible to further discuss the refill   The patient's husband has called to follow up on previous discussions related to an increase   Please contact further when possible

## 2022-05-11 NOTE — Telephone Encounter (Signed)
Patient notified in mychart message

## 2022-05-11 NOTE — Addendum Note (Signed)
Addended by: Mila Merry E on: 05/11/2022 01:00 PM   Modules accepted: Orders

## 2022-05-11 NOTE — Telephone Encounter (Signed)
Have sent refill send she is out, but she should come by to check dilantin levels, cbc, and met c sometime this week or next.

## 2022-05-13 ENCOUNTER — Ambulatory Visit (INDEPENDENT_AMBULATORY_CARE_PROVIDER_SITE_OTHER): Payer: Medicare HMO | Admitting: Psychology

## 2022-05-13 DIAGNOSIS — F3289 Other specified depressive episodes: Secondary | ICD-10-CM

## 2022-05-13 NOTE — Progress Notes (Signed)
[facility] Counselor/Therapist Progress Note  Patient ID: SHAWNESE MAGNER, MRN: [id number]   Date: 05/13/22  Time Spent: 2:06 pm - 2:59 pm : 53 Minutes  Treatment Type: Individual Therapy.  Reported Symptoms: depression and anxiety.   Mental Status Exam: Appearance:  Casual     Behavior: Appropriate  Motor: Normal  Speech/Language:  Normal Rate  Affect: Appropriate and Congruent  Mood: dysthymic  Thought process: normal  Thought content:   WNL  Sensory/Perceptual disturbances:   WNL  Orientation: oriented to person, place, time/date, and situation  Attention: Good  Concentration: Good  Memory: WNL  Fund of knowledge:  Good  Insight:   Good  Judgment:  Good  Impulse Control: Good   Risk Assessment: Danger to Self:  No Self-injurious Behavior: No Danger to Others: No Duty to Warn:no Physical Aggression / Violence:No  Access to Firearms a concern: No  Gang Involvement:No   In case of a mental health emergency:  72 - confidential suicide hotline. [facility]        [address], [place] [address]       [phone] 3.   911  4.   Visiting Nearest ED.    Subjective:   [name] participated from home, via video and consented to treatment. Therapist participated from home office. We met online due to COVID pandemic. Natonya reviewed the events of the past week.[name] noted being tried after putting a lot of energy and noted often not modulating output. She noted feeling anxious when having a lot to do with little time. She noted continued efforts to address relationship stressors and pursuing a more even distrubution of labor. We continued to explore her relationship stressors, how she feels, and her boundaries. She noted increased distance and an increased lack of support and empathy. We explored this during the session. Therapist encouraged [name] to identify boundaries and needs going  forward and to further identify what she needs in a relationship. Therapist modeled positive and assertive communication and boundary setting. Therapist validated and normalized [name]'s feelings and experience.  Therapist provided supportive therapy.  She continues to benefit from treatment.   Interventions: Interpersonal   Diagnosis:  Other depression  Psychiatric Treatment: Yes , via PCP Dr. Sherrie Mustache.   Treatment Plan:  Client Abilities/Strengths Tamyah is self-ware, motivated for change, and flexible.   Support System: Husband and family.   Client Treatment Preferences Outpatient Therapy.   Client Statement of Needs Teresa would like to engage in enjoyable activities, managing her overall mood and symptoms, improving self-talk, resolve relationship stressors, improve communication and process past events.  Treatment Level Weekly  Symptoms  Depression: loss of interest, feeling down, middle insomnia, lethargy, poor appetite, feeling bad about self, difficulty concentrating,  psycho-motor agitation, no SI.     (Status: maintained) Anxiety: Anxious, difficulty managing worry, worrying about different things, restlessness, easily annoyed, feeling afraid as if something bad might happen.    (Status: maintained)  Goals:   Cecillia experiences symptoms of depression and anxiety.    Target Date: 01/09/23 Frequency: Weekly  Progress: 0 Modality: individual    Therapist will provide referrals for additional resources as appropriate.  Therapist will provide psycho-education regarding Christa's diagnosis and corresponding treatment approaches and interventions. Licensed Clinical Social Worker, [place], LCSW will support the patient's ability to achieve the goals identified. will employ CBT, BA, Problem-solving, Solution Focused, Mindfulness,  coping skills, & other evidenced-based practices will be used to promote progress  towards healthy functioning to help manage decrease  symptoms associated with her diagnosis.   Reduce overall level, frequency, and intensity of the feelings of depression, anxiety and panic evidenced by decreased overall symptoms from 6 to 7 days/week to 0 to 1 days/week per client report for at least 3 consecutive months. Verbally express understanding of the relationship between feelings of depression, anxiety and their impact on thinking patterns and behaviors. Verbalize an understanding of the role that distorted thinking plays in creating fears, excessive worry, and ruminations.   [name] participated in the creation of the treatment plan)   [name], LCSW

## 2022-05-17 ENCOUNTER — Other Ambulatory Visit: Payer: Self-pay | Admitting: Family Medicine

## 2022-05-20 ENCOUNTER — Encounter: Payer: Self-pay | Admitting: Family Medicine

## 2022-05-20 ENCOUNTER — Telehealth (INDEPENDENT_AMBULATORY_CARE_PROVIDER_SITE_OTHER): Payer: Medicare HMO | Admitting: Family Medicine

## 2022-05-20 DIAGNOSIS — R197 Diarrhea, unspecified: Secondary | ICD-10-CM | POA: Diagnosis not present

## 2022-05-20 DIAGNOSIS — J0110 Acute frontal sinusitis, unspecified: Secondary | ICD-10-CM

## 2022-05-20 MED ORDER — AMOXICILLIN-POT CLAVULANATE 875-125 MG PO TABS: 1.0000 | ORAL_TABLET | Freq: Two times a day (BID) | ORAL | 0 refills | Status: AC

## 2022-05-20 NOTE — Progress Notes (Signed)
Established patient visit   Patient: Sharon Bryan   DOB: May 20, 1965   57 y.o. Female  MRN: [id number] Visit Date: 05/20/2022  Today's healthcare provider: Mila Merry, MD    Subjective    HPI  Thinks she is having sinus infection x weeks. Fascial pain and choking from drainage at night. Takes benadryl. Using fluticasone   Having frequent bowel movements. Previously treated with Xifaxin for same sx diagnosed as IBS-D which worked well.   Medications: Outpatient Medications Prior to Visit  Medication Sig   ALPRAZolam (XANAX) 1 MG tablet Take 1/2 tablet once or twice a day as needed for anxiety and panic attacks   amitriptyline (ELAVIL) 25 MG tablet TAKE 2 TO 3 TABLETS(50 TO 75 MG) BY MOUTH AT BEDTIME (Patient taking differently: Taking prn, only about once a month for sleep)   Aspirin-Acetaminophen-Caffeine (EXCEDRIN PO) Take 1 tablet daily as needed by mouth.   busPIRone (BUSPAR) 30 MG tablet TAKE 1 TABLET BY MOUTH TWICE DAILY   DULoxetine (CYMBALTA) 60 MG capsule Take 1 capsule (60 mg total) by mouth daily.   estradiol (ESTRACE) 2 MG tablet TAKE 1 TABLET(2 MG) BY MOUTH DAILY   fluticasone (FLONASE) 50 MCG/ACT nasal spray Place 2 sprays into both nostrils daily.   lamoTRIgine (LAMICTAL) 100 MG tablet TAKE 1 TABLET BY MOUTH TWICE DAILY   levothyroxine (SYNTHROID) 75 MCG tablet TAKE 1 TABLET(75 MCG) BY MOUTH DAILY   Magnesium Oxide 420 MG TABS Take 400 mg by mouth daily.   mometasone (ELOCON) 0.1 % cream Apply twice daily to affected body areas 5 days per week as needed. Avoid applying to face, groin, and axilla   Multiple Vitamins-Minerals (CENTRUM SILVER PO) Take by mouth.   nabumetone (RELAFEN) 750 MG tablet TAKE 1 TO 2 TABLETS(750 TO 1500 MG) BY MOUTH DAILY AS NEEDED FOR LEG AND KNEE PAIN   neomycin-polymyxin-hydrocortisone (CORTISPORIN) 3.5-10000-1 OTIC suspension SHAKE LIQUID AND INSTILL 3 TO 4 DROPS TO AFFECTED EAR FOUR TIMES DAILY   omeprazole (PRILOSEC) 20 MG  capsule TAKE 1 CAPSULE BY MOUTH EVERY DAY   PARoxetine (PAXIL) 30 MG tablet Take 1 tablet (30 mg total) by mouth daily.   phenytoin (DILANTIN) 100 MG ER capsule Take 2 capsules (200 mg total) by mouth every morning AND 3 capsules (300 mg total) every evening.   pregabalin (LYRICA) 225 MG capsule Take 1 capsule (225 mg total) by mouth 2 (two) times daily. (Patient taking differently: Take 225 mg by mouth 2 (two) times daily. For fibromyalgia)   rifaximin (XIFAXAN) 550 MG TABS tablet TAKE 1 TABLET BY MOUTH THREE TIMES DAILY   traMADol (ULTRAM) 50 MG tablet Take 1 tablet (50 mg total) by mouth every 8 (eight) hours as needed.   No facility-administered medications prior to visit.         Objective    LMP 04/26/2002 (LMP Unknown)    Physical Exam   General Appearance:    Obese female, alert, cooperative, in no acute distress  HENT:   bilateral TM normal without fluid or infection, neck has bilateral anterior cervical nodes enlarged, frontal sinus tender, and nasal mucosa congested  Eyes:    PERRL, conjunctiva/corneas clear, EOM's intact       Lungs:     Clear to auscultation bilaterally, respirations unlabored  Heart:    Normal heart rate. Normal rhythm. No murmurs, rubs, or gallops.    Neurologic:   Awake, alert, oriented x 3. No apparent focal neurological  defect.        Assessment & Plan     1. Acute non-recurrent frontal sinusitis  - amoxicillin-clavulanate (AUGMENTIN) 875-125 MG tablet; Take 1 tablet by mouth 2 (two) times daily for 7 days.  Dispense: 14 tablet; Refill: 0  2. Diarrhea, unspecified type Identical to previous episode of IBS-D which responded well to Xifaxin. Rx was sent to pharmacy       The entirety of the information documented in the History of Present Illness, Review of Systems and Physical Exam were personally obtained by me. Portions of this information were initially documented by the CMA and reviewed by me for thoroughness and accuracy.      Mila Merry, MD  [facility] [phone] (phone) [phone] (fax)  [facility]

## 2022-05-27 ENCOUNTER — Ambulatory Visit (INDEPENDENT_AMBULATORY_CARE_PROVIDER_SITE_OTHER): Payer: Medicare HMO | Admitting: Psychology

## 2022-05-27 DIAGNOSIS — F3289 Other specified depressive episodes: Secondary | ICD-10-CM

## 2022-05-27 NOTE — Progress Notes (Signed)
[facility] Counselor/Therapist Progress Note  Patient ID: Sharon Bryan, MRN: [id number]   Date: 05/27/22  Time Spent: 2:35 pm - 3:27 pm :  Treatment Type: Individual Therapy.  Reported Symptoms: depression and anxiety.   Mental Status Exam: Appearance:  Casual     Behavior: Appropriate  Motor: Normal  Speech/Language:  Normal Rate  Affect: Appropriate and Congruent  Mood: dysthymic  Thought process: normal  Thought content:   WNL  Sensory/Perceptual disturbances:   WNL  Orientation: oriented to person, place, time/date, and situation  Attention: Good  Concentration: Good  Memory: WNL  Fund of knowledge:  Good  Insight:   Good  Judgment:  Good  Impulse Control: Good   Risk Assessment: Danger to Self:  No Self-injurious Behavior: No Danger to Others: No Duty to Warn:no Physical Aggression / Violence:No  Access to Firearms a concern: No  Gang Involvement:No   In case of a mental health emergency:  24 - confidential suicide hotline. [facility]):        [address], [place] [address]       [phone] 3.   911  4.   Visiting Nearest ED.    Subjective:   Sharon Bryan participated from home, via video and consented to treatment. Therapist participated from home office. We met online due to COVID pandemic. [name] reviewed the events of the past week.She noted frustration regarding not being heard by her husband after her warning about the furniture. She noted being ignored by her significant other despite the communication. She noted frustration regarding [employer] insistence to have her follow-up with the furniture warranty. We explored this during the session and her boundaries related to this. She noted often feeling used. We worked on identifying boundaries and ways to maintain them. Therapist encouraged Sharon Bryan to identify her boundaries going forward, being assertive, and  communicating needs consistently. Therapist provided supportive therapy.  She continues to benefit from treatment.   Interventions: Interpersonal   Diagnosis:  Other depression  Psychiatric Treatment: Yes , via PCP Dr. Sherrie Mustache.   Treatment Plan:  Client Abilities/Strengths Christopher is self-ware, motivated for change, and flexible.   Support System: Husband and family.   Client Treatment Preferences Outpatient Therapy.   Client Statement of Needs Adrialys would like to engage in enjoyable activities, managing her overall mood and symptoms, improving self-talk, resolve relationship stressors, improve communication and process past events.  Treatment Level Weekly  Symptoms  Depression: loss of interest, feeling down, middle insomnia, lethargy, poor appetite, feeling bad about self, difficulty concentrating,  psycho-motor agitation, no SI.     (Status: maintained) Anxiety: Anxious, difficulty managing worry, worrying about different things, restlessness, easily annoyed, feeling afraid as if something bad might happen.    (Status: maintained)  Goals:   Sharon Bryan experiences symptoms of depression and anxiety.    Target Date: 01/09/23 Frequency: Weekly  Progress: 0 Modality: individual    Therapist will provide referrals for additional resources as appropriate.  Therapist will provide psycho-education regarding Clairissa's diagnosis and corresponding treatment approaches and interventions. Licensed Clinical Social Worker, [place], LCSW will support the patient's ability to achieve the goals identified. will employ CBT, BA, Problem-solving, Solution Focused, Mindfulness,  coping skills, & other evidenced-based practices will be used to promote progress towards healthy functioning to help manage decrease symptoms associated with her diagnosis.   Reduce overall level, frequency, and intensity of the feelings of depression, anxiety and panic evidenced by decreased overall  symptoms  from 6 to 7 days/week to 0 to 1 days/week per client report for at least 3 consecutive months. Verbally express understanding of the relationship between feelings of depression, anxiety and their impact on thinking patterns and behaviors. Verbalize an understanding of the role that distorted thinking plays in creating fears, excessive worry, and ruminations.   Sharon Bryan participated in the creation of the treatment plan)   [name], LCSW

## 2022-06-03 ENCOUNTER — Other Ambulatory Visit: Payer: Self-pay

## 2022-06-03 DIAGNOSIS — G40909 Epilepsy, unspecified, not intractable, without status epilepticus: Secondary | ICD-10-CM

## 2022-06-03 DIAGNOSIS — M170 Bilateral primary osteoarthritis of knee: Secondary | ICD-10-CM | POA: Diagnosis not present

## 2022-06-04 LAB — CBC WITH DIFFERENTIAL/PLATELET
Basophils Absolute: 0 10*3/uL (ref 0.0–0.2)
Basos: 0 %
EOS (ABSOLUTE): 0.1 10*3/uL (ref 0.0–0.4)
Eos: 1 %
Hematocrit: 37.3 % (ref 34.0–46.6)
Hemoglobin: 13 g/dL (ref 11.1–15.9)
Immature Grans (Abs): 0 10*3/uL (ref 0.0–0.1)
Immature Granulocytes: 0 %
Lymphocytes Absolute: 3.9 10*3/uL — ABNORMAL HIGH (ref 0.7–3.1)
Lymphs: 40 %
MCH: 32.3 pg (ref 26.6–33.0)
MCHC: 34.9 g/dL (ref 31.5–35.7)
MCV: 93 fL (ref 79–97)
Monocytes Absolute: 0.5 10*3/uL (ref 0.1–0.9)
Monocytes: 6 %
Neutrophils Absolute: 5.1 10*3/uL (ref 1.4–7.0)
Neutrophils: 53 %
Platelets: 267 10*3/uL (ref 150–450)
RBC: 4.03 x10E6/uL (ref 3.77–5.28)
RDW: 11.4 % — ABNORMAL LOW (ref 11.7–15.4)
WBC: 9.7 10*3/uL (ref 3.4–10.8)

## 2022-06-04 LAB — COMPREHENSIVE METABOLIC PANEL
ALT: 20 IU/L (ref 0–32)
AST: 17 IU/L (ref 0–40)
Albumin/Globulin Ratio: 1.5 (ref 1.2–2.2)
Albumin: 4.2 g/dL (ref 3.8–4.9)
Alkaline Phosphatase: 73 IU/L (ref 44–121)
BUN/Creatinine Ratio: 15 (ref 9–23)
BUN: 11 mg/dL (ref 6–24)
Bilirubin Total: <0.2 mg/dL (ref 0.0–1.2)
CO2: 24 mmol/L (ref 20–29)
Calcium: 9.6 mg/dL (ref 8.7–10.2)
Chloride: 104 mmol/L (ref 96–106)
Creatinine, Ser: 0.72 mg/dL (ref 0.57–1.00)
Globulin, Total: 2.8 g/dL (ref 1.5–4.5)
Glucose: 91 mg/dL (ref 70–99)
Potassium: 4.5 mmol/L (ref 3.5–5.2)
Sodium: 141 mmol/L (ref 134–144)
Total Protein: 7 g/dL (ref 6.0–8.5)
eGFR: 99 mL/min/{1.73_m2} (ref >59)

## 2022-06-04 LAB — PHENYTOIN LEVEL, TOTAL: Phenytoin (Dilantin), Serum: 5.9 ug/mL — ABNORMAL LOW (ref 10.0–20.0)

## 2022-06-06 ENCOUNTER — Other Ambulatory Visit: Payer: Self-pay | Admitting: Family Medicine

## 2022-06-06 ENCOUNTER — Ambulatory Visit: Payer: Medicare HMO | Admitting: Psychology

## 2022-06-06 DIAGNOSIS — F3289 Other specified depressive episodes: Secondary | ICD-10-CM

## 2022-06-09 NOTE — Progress Notes (Signed)
I,Joseline E Rosas,acting as a scribe for Mila Merry, MD.,have documented all relevant documentation on the behalf of Mila Merry, MD,as directed by  Mila Merry, MD while in the presence of Mila Merry, MD.   MyChart Video Visit    Virtual Visit via Video Note   This format is felt to be most appropriate for this patient at this time. Physical exam was limited by quality of the video and audio technology used for the visit.   Patient location: home Provider location: [facility]  I discussed the limitations of evaluation and management by telemedicine and the availability of in person appointments. The patient expressed understanding and agreed to proceed.  Patient: Sharon Bryan   DOB: 1965/09/07   57 y.o. Female  MRN: [id number] Visit Date: 06/10/2022  Today's healthcare provider: Mila Merry, MD   Chief Complaint  Patient presents with   Fatigue   Subjective    HPI  Fatigue: Patient complains of fatigue. Symptoms began  for several years. Chronic problem, worsening for the past few weeks .Symptoms of her fatigue have been feelings of depression, general malaise, headaches, lack of interest in usual activities, and sleeping constant since Saturday until yesterday Thursday. Reports sleeping all day long and just gets up to take her medicines and to eat something. Appetite is not great. Has had similar episodes periodically for several years which usually improve after a few weeks. .    Had steroid injection both knees last Friday, but extreme fatigue started about a week prior to that. No other recent medication changes. Feels like she sleep soundly when she does sleep.    Medications: Outpatient Medications Prior to Visit  Medication Sig   ALPRAZolam (XANAX) 1 MG tablet Take 1/2 tablet once or twice a day as needed for anxiety and panic attacks   amitriptyline (ELAVIL) 25 MG tablet TAKE 2 TO 3 TABLETS(50 TO 75 MG) BY MOUTH AT BEDTIME (Patient taking differently:  Taking prn, only about once a month for sleep)   Aspirin-Acetaminophen-Caffeine (EXCEDRIN PO) Take 1 tablet daily as needed by mouth.   busPIRone (BUSPAR) 30 MG tablet TAKE 1 TABLET BY MOUTH TWICE DAILY   DULoxetine (CYMBALTA) 60 MG capsule Take 1 capsule (60 mg total) by mouth daily.   estradiol (ESTRACE) 2 MG tablet TAKE 1 TABLET(2 MG) BY MOUTH DAILY   fluticasone (FLONASE) 50 MCG/ACT nasal spray Place 2 sprays into both nostrils daily.   lamoTRIgine (LAMICTAL) 100 MG tablet TAKE 1 TABLET BY MOUTH TWICE DAILY   levothyroxine (SYNTHROID) 75 MCG tablet TAKE 1 TABLET(75 MCG) BY MOUTH DAILY   Magnesium Oxide 420 MG TABS Take 400 mg by mouth daily.   mometasone (ELOCON) 0.1 % cream Apply twice daily to affected body areas 5 days per week as needed. Avoid applying to face, groin, and axilla   Multiple Vitamins-Minerals (CENTRUM SILVER PO) Take by mouth.   nabumetone (RELAFEN) 750 MG tablet TAKE 1 TO 2 TABLETS(750 TO 1500 MG) BY MOUTH DAILY AS NEEDED FOR LEG AND KNEE PAIN   neomycin-polymyxin-hydrocortisone (CORTISPORIN) 3.5-10000-1 OTIC suspension SHAKE LIQUID AND INSTILL 3 TO 4 DROPS TO AFFECTED EAR FOUR TIMES DAILY   PARoxetine (PAXIL) 30 MG tablet Take 1 tablet (30 mg total) by mouth daily.   phenytoin (DILANTIN) 100 MG ER capsule Take 2 capsules (200 mg total) by mouth every morning AND 3 capsules (300 mg total) every evening.   pregabalin (LYRICA) 225 MG capsule Take 1 capsule (225 mg total) by mouth 2 (two) times  daily. (Patient taking differently: Take 225 mg by mouth 2 (two) times daily. For fibromyalgia)   rifaximin (XIFAXAN) 550 MG TABS tablet TAKE 1 TABLET BY MOUTH THREE TIMES DAILY   traMADol (ULTRAM) 50 MG tablet Take 1 tablet (50 mg total) by mouth every 8 (eight) hours as needed.   omeprazole (PRILOSEC) 20 MG capsule TAKE 1 CAPSULE BY MOUTH EVERY DAY   No facility-administered medications prior to visit.    Review of Systems  Constitutional:  Negative for appetite change, chills,  fatigue and fever.  Respiratory:  Negative for chest tightness and shortness of breath.   Cardiovascular:  Negative for chest pain and palpitations.  Gastrointestinal:  Negative for abdominal pain, nausea and vomiting.  Neurological:  Negative for dizziness and weakness.    Last CBC Lab Results  Component Value Date   WBC 9.7 06/03/2022   HGB 13.0 06/03/2022   HCT 37.3 06/03/2022   MCV 93 06/03/2022   MCH 32.3 06/03/2022   RDW 11.4 (L) 06/03/2022   PLT 267 06/03/2022   Last metabolic panel Lab Results  Component Value Date   GLUCOSE 91 06/03/2022   NA 141 06/03/2022   K 4.5 06/03/2022   CL 104 06/03/2022   CO2 24 06/03/2022   BUN 11 06/03/2022   CREATININE 0.72 06/03/2022   EGFR 99 06/03/2022   CALCIUM 9.6 06/03/2022   PROT 7.0 06/03/2022   ALBUMIN 4.2 06/03/2022   LABGLOB 2.8 06/03/2022   AGRATIO 1.5 06/03/2022   BILITOT <0.2 06/03/2022   ALKPHOS 73 06/03/2022   AST 17 06/03/2022   ALT 20 06/03/2022   ANIONGAP 8 09/18/2012   Last thyroid functions Lab Results  Component Value Date   TSH 3.510 03/15/2022   T4TOTAL 8.2 03/15/2022     Objective    LMP 04/26/2002 (LMP Unknown)      Physical Exam  Awake, alert, oriented x 3. In no apparent distress    Assessment & Plan     1. Excessive daytime sleepiness Suspect OSA. MSPG has been previously ordered but not yet performed. She or her husband is going to call [facility] to schedule.   2. Morbid obesity (HCC) Her husband asks about possibility of Cushings syndrome. Will order dexamethasone suppression test.   3. Seizure disorder (HCC) refill phenytoin (DILANTIN) 100 MG ER capsule; Take 2 capsules (200 mg total) by mouth every morning AND 3 capsules (300 mg total) every evening.  Dispense: 540 capsule; Refill: 2     I discussed the assessment and treatment plan with the patient. The patient was provided an opportunity to ask questions and all were answered. The patient agreed with the plan and  demonstrated an understanding of the instructions.   The patient was advised to call back or seek an in-person evaluation if the symptoms worsen or if the condition fails to improve as anticipated.  I provided 12 minutes of non-face-to-face time during this encounter.  The entirety of the information documented in the History of Present Illness, Review of Systems and Physical Exam were personally obtained by me. Portions of this information were initially documented by the CMA and reviewed by me for thoroughness and accuracy.    Mila Merry, MD [facility] [phone] (phone) [phone] (fax)  [facility]

## 2022-06-10 ENCOUNTER — Encounter: Payer: Self-pay | Admitting: Family Medicine

## 2022-06-10 ENCOUNTER — Telehealth (INDEPENDENT_AMBULATORY_CARE_PROVIDER_SITE_OTHER): Payer: Medicare HMO | Admitting: Family Medicine

## 2022-06-10 DIAGNOSIS — G40909 Epilepsy, unspecified, not intractable, without status epilepticus: Secondary | ICD-10-CM | POA: Diagnosis not present

## 2022-06-10 DIAGNOSIS — G4719 Other hypersomnia: Secondary | ICD-10-CM | POA: Diagnosis not present

## 2022-06-10 MED ORDER — DEXAMETHASONE 1 MG PO TABS: ORAL_TABLET | ORAL | 0 refills | Status: DC

## 2022-06-10 MED ORDER — PHENYTOIN SODIUM EXTENDED 100 MG PO CAPS: ORAL_CAPSULE | ORAL | 2 refills | Status: DC

## 2022-06-10 NOTE — Patient Instructions (Addendum)
I've sent in a prescription for a dose of dexamethasone for the dexamethasone suppression test. You will need to take the dexamethasone at night, 9-10 hours before having your blood drawn the next morning.  Patient's typically take the dexamethasone table at 11:00 pm. Then go to the lab their blood drawn between 8 and 9 am the next morning. Our lab opens at 8:00am Monday through Friday, but will be closed on memorial day.

## 2022-06-17 ENCOUNTER — Ambulatory Visit (INDEPENDENT_AMBULATORY_CARE_PROVIDER_SITE_OTHER): Payer: Medicare HMO | Admitting: Psychology

## 2022-06-17 DIAGNOSIS — F3289 Other specified depressive episodes: Secondary | ICD-10-CM

## 2022-06-17 NOTE — Progress Notes (Signed)
[facility] Counselor/Therapist Progress Note  Patient ID: Sharon Bryan, MRN: [id number]   Date: 06/17/22  Time Spent: 10:35 pm - 11:28  pm : 53 Minutes  Treatment Type: Individual Therapy.  Reported Symptoms: depression and anxiety.   Mental Status Exam: Appearance:  Casual     Behavior: Appropriate  Motor: Normal  Speech/Language:  Normal Rate  Affect: Appropriate and Congruent  Mood: dysthymic  Thought process: normal  Thought content:   WNL  Sensory/Perceptual disturbances:   WNL  Orientation: oriented to person, place, time/date, and situation  Attention: Good  Concentration: Good  Memory: WNL  Fund of knowledge:  Good  Insight:   Good  Judgment:  Good  Impulse Control: Good   Risk Assessment: Danger to Self:  No Self-injurious Behavior: No Danger to Others: No Duty to Warn:no Physical Aggression / Violence:No  Access to Firearms a concern: No  Gang Involvement:No   In case of a mental health emergency:  39 - confidential suicide hotline. [facility]):        [address], [place] [address]       [phone] 3.   911  4.   Visiting Nearest ED.    Subjective:   Sharon Bryan participated from home, via video and consented to treatment. Therapist participated from home office. We met online due to COVID pandemic. Sharon Bryan reviewed the events of the past week. She noted missing the previous appointment due to out of town travel. She noted worry about upcoming medical testing for Cushing's disease and the worry related to the possible required treatment. She noted seeing this as "karma" and noted difficulty making sense of this in relation to her siblings who have had healthier lives despite poor life-style choices. She noted having to struggle her whole life and her siblings having things handed to them. She noted always adopting the perspective of cause and effect. She noted growing  up thinking "God was punishing me" when bad things happened. She noted working towards improving her health with the care of her providers and noted beginning therapy for her knees. We worked on identifying the various interventions at her disposal. Therapist highlighted negative thinking regarding health and worked on processing this. She noted limited ability to complete tasks due to health and wanting to engage in gardening. We worked on problem-solving this and Sharon Bryan noted a lack of support from her significant other. We worked on identifying ways to communicate concerns, be assertive, and resolve conflict. Therapist modeled this during the session and encouraged work on this area. Sharon Bryan was engaged and motivated during the session and expressed commitment towards our goals. Therapist provided supportive therapy.  She continues to benefit from treatment.   Interventions: Interpersonal   Diagnosis:  Other depression  Psychiatric Treatment: Yes , via PCP Dr. Sherrie Mustache.   Treatment Plan:  Client Abilities/Strengths Sharon Bryan is self-ware, motivated for change, and flexible.   Support System: Husband and family.   Client Treatment Preferences Outpatient Therapy.   Client Statement of Needs Sharon Bryan would like to engage in enjoyable activities, managing her overall mood and symptoms, improving self-talk, resolve relationship stressors, improve communication and process past events.  Treatment Level Weekly  Symptoms  Depression: loss of interest, feeling down, middle insomnia, lethargy, poor appetite, feeling bad about self, difficulty concentrating,  psycho-motor agitation, no SI.     (Status: maintained) Anxiety: Anxious, difficulty managing worry, worrying about different things, restlessness, easily annoyed, feeling afraid as if  something bad might happen.    (Status: maintained)  Goals:   Sharon Bryan experiences symptoms of depression and anxiety.    Target Date: 01/09/23 Frequency:  Weekly  Progress: 0 Modality: individual    Therapist will provide referrals for additional resources as appropriate.  Therapist will provide psycho-education regarding [name]'s diagnosis and corresponding treatment approaches and interventions. Licensed Clinical Social Worker, [place], LCSW will support the patient's ability to achieve the goals identified. will employ CBT, BA, Problem-solving, Solution Focused, Mindfulness,  coping skills, & other evidenced-based practices will be used to promote progress towards healthy functioning to help manage decrease symptoms associated with her diagnosis.   Reduce overall level, frequency, and intensity of the feelings of depression, anxiety and panic evidenced by decreased overall symptoms from 6 to 7 days/week to 0 to 1 days/week per client report for at least 3 consecutive months. Verbally express understanding of the relationship between feelings of depression, anxiety and their impact on thinking patterns and behaviors. Verbalize an understanding of the role that distorted thinking plays in creating fears, excessive worry, and ruminations.   Sharon Bryan participated in the creation of the treatment plan)   Sharon Ovens, LCSW

## 2022-06-18 ENCOUNTER — Other Ambulatory Visit: Payer: Self-pay | Admitting: Family Medicine

## 2022-06-18 DIAGNOSIS — M170 Bilateral primary osteoarthritis of knee: Secondary | ICD-10-CM

## 2022-06-18 DIAGNOSIS — F411 Generalized anxiety disorder: Secondary | ICD-10-CM

## 2022-06-25 ENCOUNTER — Other Ambulatory Visit: Payer: Self-pay | Admitting: Family Medicine

## 2022-06-28 LAB — CORTISOL-AM, BLOOD: Cortisol - AM: 6.6 ug/dL (ref 6.2–19.4)

## 2022-07-05 ENCOUNTER — Other Ambulatory Visit: Payer: Self-pay | Admitting: Family Medicine

## 2022-07-06 NOTE — Telephone Encounter (Signed)
Requested medication (s) are due for refill today - expired Rx  Requested medication (s) are on the active medication list -yes  Future visit scheduled -no  Last refill: 04/14/21 10ml 3RF  Notes to clinic: expired Rx, off protocol- provider review   Requested Prescriptions  Pending Prescriptions Disp Refills   neomycin-polymyxin-hydrocortisone (CORTISPORIN) 3.5-10000-1 OTIC suspension [Pharmacy Med Name: NEO/POLY/HC 1% OTIC SUSP GREEN(EAR)] 10 mL 3    Sig: SHAKE LIQUID AND INSTILL 3 TO 4 DROPS TO AFFECTED EAR FOUR TIMES DAILY     Off-Protocol Failed - 07/05/2022  3:03 PM      Failed - Medication not assigned to a protocol, review manually.      Passed - Valid encounter within last 12 months    Recent Outpatient Visits           3 weeks ago Excessive daytime sleepiness   [place] [facility] Malva Limes, MD   1 month ago Acute non-recurrent frontal sinusitis   [place] [facility] Malva Limes, MD   2 months ago Primary osteoarthritis of both knees   [place] [facility] Malva Limes, MD   3 months ago Excessive daytime sleepiness   [place] [facility] Malva Limes, MD   5 months ago Generalized anxiety disorder   [facility] Malva Limes, MD       Future Appointments             In 4 weeks Deirdre Evener, MD [facility]               Requested Prescriptions  Pending Prescriptions Disp Refills   neomycin-polymyxin-hydrocortisone (CORTISPORIN) 3.5-10000-1 OTIC suspension [Pharmacy Med Name: NEO/POLY/HC 1% OTIC SUSP GREEN(EAR)] 10 mL 3    Sig: SHAKE LIQUID AND INSTILL 3 TO 4 DROPS TO AFFECTED EAR FOUR TIMES DAILY     Off-Protocol Failed - 07/05/2022  3:03 PM      Failed - Medication not assigned to a protocol, review manually.      Passed - Valid encounter within last 12 months    Recent Outpatient Visits           3  weeks ago Excessive daytime sleepiness   [place] [facility] Malva Limes, MD   1 month ago Acute non-recurrent frontal sinusitis   [place] [facility] Malva Limes, MD   2 months ago Primary osteoarthritis of both knees   [place] [facility] Malva Limes, MD   3 months ago Excessive daytime sleepiness   [place] [facility] Malva Limes, MD   5 months ago Generalized anxiety disorder   [facility] Malva Limes, MD       Future Appointments             In 4 weeks Deirdre Evener, MD [facility]

## 2022-07-08 ENCOUNTER — Telehealth (INDEPENDENT_AMBULATORY_CARE_PROVIDER_SITE_OTHER): Payer: Medicare HMO | Admitting: Family Medicine

## 2022-07-08 DIAGNOSIS — R519 Headache, unspecified: Secondary | ICD-10-CM

## 2022-07-08 DIAGNOSIS — G4719 Other hypersomnia: Secondary | ICD-10-CM

## 2022-07-08 DIAGNOSIS — R7989 Other specified abnormal findings of blood chemistry: Secondary | ICD-10-CM | POA: Diagnosis not present

## 2022-07-08 DIAGNOSIS — H9209 Otalgia, unspecified ear: Secondary | ICD-10-CM | POA: Diagnosis not present

## 2022-07-08 DIAGNOSIS — G40909 Epilepsy, unspecified, not intractable, without status epilepticus: Secondary | ICD-10-CM

## 2022-07-08 DIAGNOSIS — R252 Cramp and spasm: Secondary | ICD-10-CM | POA: Diagnosis not present

## 2022-07-08 MED ORDER — NEOMYCIN-POLYMYXIN-HC 3.5-10000-1 OT SUSP: OTIC | 3 refills | Status: DC

## 2022-07-08 MED ORDER — NORTRIPTYLINE HCL 10 MG PO CAPS: 10.0000 mg | ORAL_CAPSULE | Freq: Every day | ORAL | 1 refills | Status: DC

## 2022-07-08 MED ORDER — MODAFINIL 100 MG PO TABS: 100.0000 mg | ORAL_TABLET | Freq: Every morning | ORAL | 1 refills | Status: DC

## 2022-07-08 NOTE — Progress Notes (Signed)
I,Sulibeya S Dimas,acting as a Neurosurgeon for Mila Merry, MD.,have documented all relevant documentation on the behalf of Mila Merry, MD,as directed by  Mila Merry, MD while in the presence of Mila Merry, MD.   MyChart Video Visit    Virtual Visit via Video Note   This format is felt to be most appropriate for this patient at this time. Physical exam was limited by quality of the video and audio technology used for the visit.   Patient location: home Provider location: b[facility]  I discussed the limitations of evaluation and management by telemedicine and the availability of in person appointments. The patient expressed understanding and agreed to proceed.  Patient: Sharon Bryan   DOB: 1965-07-28   57 y.o. Female  MRN: [id number] Visit Date: 07/08/2022  Today's healthcare provider: Mila Merry, MD   Chief Complaint  Patient presents with   Fatigue   Subjective      History of Present Illness   Patient reports worsening headaches that have been severe enough to wake her from sleep and have been occurring more frequently. The patient reports that the headaches are so severe that they cause discomfort with movement. The patient has not been experiencing these headaches previously and describes them as a new symptom.  The patient also reports cramping in the feet and legs.  The patient is scheduled to undergo a sleep study due to concerns about sleep apnea, which may be contributing to her fatigue.     Is taking 2,000 units vitamin d3 a day and taking daily magnesium supplements    Medications: Outpatient Medications Prior to Visit  Medication Sig   ALPRAZolam (XANAX) 1 MG tablet Take 1/2 tablet once or twice a day as needed for anxiety and panic attacks   amitriptyline (ELAVIL) 25 MG tablet TAKE 2 TO 3 TABLETS(50 TO 75 MG) BY MOUTH AT BEDTIME (Patient taking differently: Taking prn, only about once a month for sleep)   Aspirin-Acetaminophen-Caffeine (EXCEDRIN  PO) Take 1 tablet daily as needed by mouth.   busPIRone (BUSPAR) 30 MG tablet TAKE 1 TABLET BY MOUTH TWICE DAILY   dexamethasone (DECADRON) 1 MG tablet Take one tablet at night, 9-10 hours before having blood drawn the next morning   DULoxetine (CYMBALTA) 60 MG capsule Take 1 capsule (60 mg total) by mouth daily.   estradiol (ESTRACE) 2 MG tablet TAKE 1 TABLET(2 MG) BY MOUTH DAILY   fluticasone (FLONASE) 50 MCG/ACT nasal spray Place 2 sprays into both nostrils daily.   lamoTRIgine (LAMICTAL) 100 MG tablet TAKE 1 TABLET BY MOUTH TWICE DAILY   levothyroxine (SYNTHROID) 75 MCG tablet TAKE 1 TABLET(75 MCG) BY MOUTH DAILY   Magnesium Oxide 420 MG TABS Take 400 mg by mouth daily.   mometasone (ELOCON) 0.1 % cream Apply twice daily to affected body areas 5 days per week as needed. Avoid applying to face, groin, and axilla   Multiple Vitamins-Minerals (CENTRUM SILVER PO) Take by mouth.   nabumetone (RELAFEN) 750 MG tablet TAKE 1 TO 2 TABLETS(750 TO 1500 MG) BY MOUTH DAILY AS NEEDED FOR LEG AND KNEE PAIN   neomycin-polymyxin-hydrocortisone (CORTISPORIN) 3.5-10000-1 OTIC suspension SHAKE LIQUID AND INSTILL 3 TO 4 DROPS TO AFFECTED EAR FOUR TIMES DAILY   omeprazole (PRILOSEC) 20 MG capsule TAKE 1 CAPSULE BY MOUTH EVERY DAY   PARoxetine (PAXIL) 30 MG tablet Take 1 tablet (30 mg total) by mouth daily.   phenytoin (DILANTIN) 100 MG ER capsule Take 2 capsules (200 mg total) by mouth every morning  AND 3 capsules (300 mg total) every evening.   pregabalin (LYRICA) 225 MG capsule Take 1 capsule (225 mg total) by mouth 2 (two) times daily. (Patient taking differently: Take 225 mg by mouth 2 (two) times daily. For fibromyalgia)   traMADol (ULTRAM) 50 MG tablet Take 1 tablet (50 mg total) by mouth every 8 (eight) hours as needed.   XIFAXAN 550 MG TABS tablet TAKE 1 TABLET BY MOUTH THREE TIMES DAILY   No facility-administered medications prior to visit.    Review of Systems  Constitutional:  Positive for activity  change, appetite change and fatigue.  Respiratory:  Negative for cough, chest tightness and shortness of breath.   Cardiovascular:  Negative for chest pain and palpitations.  Gastrointestinal:  Positive for nausea. Negative for abdominal pain, blood in stool, constipation, diarrhea and vomiting.  Genitourinary:  Negative for dysuria and vaginal bleeding.  Musculoskeletal:  Positive for arthralgias and myalgias.  Neurological:  Positive for numbness and headaches. Negative for dizziness, tremors and light-headedness.  Psychiatric/Behavioral:  Negative for decreased concentration.     Objective    LMP 04/26/2002 (LMP Unknown)   Physical Exam   Awake, alert, oriented x 3. In no apparent distress   Assessment & Plan     Assessment and Plan    Chronic Headaches: Increased frequency and severity of headaches, waking patient from sleep. Previously on Amitriptyline, discontinued due to daytime grogginess. -Start Nortriptyline 10mg  at night, titrate up every 4 days as needed for headache control. -Order CT scan to rule out any new underlying causes for headaches.  Suspected Sleep Apnea: Patient reports fatigue, undergoing sleep study this weekend. -Await results of sleep study for further management.  Fatigue: Chronic fatigue, possibly related to sleep apnea and/or headaches. -Start Provigil 1 tablet daily in the morning to help with daytime wakefulness.  Leg Cramps: Reports of leg cramps, possibly related to electrolyte imbalance. -Order blood work to check Magnesium and Potassium levels.  Vitamin D Supplementation: Currently on high dose Vitamin D supplementation (3000 units daily). -Order blood work to check Vitamin D levels.  Rule out Cushing's Syndrome: Previous borderline cortisol levels. -Order 24-hour urine collection for definitive diagnosis.  Otitis Externa: Request for refill of Polymyxin ear drops. -Send prescription for Polymyxin ear drops to  [facility].  Follow-up: -Check blood work results for Magnesium, Potassium, and Vitamin D levels. -Review results of sleep study once available. -24-hour urine collection for cortisol. -Adjust Nortriptyline dosage based on headache control and side effects. -Adjust Vitamin D supplementation based on blood work results.          I discussed the assessment and treatment plan with the patient. The patient was provided an opportunity to ask questions and all were answered. The patient agreed with the plan and demonstrated an understanding of the instructions.   The patient was advised to call back or seek an in-person evaluation if the symptoms worsen or if the condition fails to improve as anticipated.  I provided 12 minutes of non-face-to-face time during this encounter.   Mila Merry, MD [facility] [phone] (phone) [phone] (fax)  [facility]

## 2022-07-09 DIAGNOSIS — G4730 Sleep apnea, unspecified: Secondary | ICD-10-CM | POA: Diagnosis not present

## 2022-07-12 ENCOUNTER — Telehealth: Payer: Self-pay

## 2022-07-12 NOTE — Telephone Encounter (Signed)
Approved today PA Case: [id number], Status: Approved, Coverage Starts on: 01/24/2022 12:00:00 AM, Coverage Ends on: 01/24/2023 12:00:00 AM. [facility] pharmacy advised.

## 2022-07-12 NOTE — Telephone Encounter (Signed)
PA started for KEY: [id number]

## 2022-07-12 NOTE — Telephone Encounter (Signed)
-----   Message from Unknown F[place] sent at 07/11/2022  8:40 AM EDT ----- Please review incoming fax

## 2022-07-14 ENCOUNTER — Ambulatory Visit (INDEPENDENT_AMBULATORY_CARE_PROVIDER_SITE_OTHER): Payer: Medicare HMO | Admitting: Psychology

## 2022-07-14 DIAGNOSIS — F3289 Other specified depressive episodes: Secondary | ICD-10-CM

## 2022-07-14 NOTE — Progress Notes (Signed)
[facility] Counselor/Therapist Progress Note  Patient ID: Sharon Bryan, MRN: [id number]   Date: 07/14/22  Time Spent: 9:07 am - 9:54 am : 47 Minutes  Treatment Type: Individual Therapy.  Reported Symptoms: depression and anxiety.   Mental Status Exam: Appearance:  Casual     Behavior: Appropriate  Motor: Normal  Speech/Language:  Normal Rate  Affect: Appropriate and Congruent  Mood: normal  Thought process: normal  Thought content:   WNL  Sensory/Perceptual disturbances:   WNL  Orientation: oriented to person, place, time/date, and situation  Attention: Good  Concentration: Good  Memory: WNL  Fund of knowledge:  Good  Insight:   Good  Judgment:  Good  Impulse Control: Good   Risk Assessment: Danger to Self:  No Self-injurious Behavior: No Danger to Others: No Duty to Warn:no Physical Aggression / Violence:No  Access to Firearms a concern: No  Gang Involvement:No   In case of a mental health emergency:  39 - confidential suicide hotline. Visiting [facility] Ur[facility] Care [facility]):        [address], [place] [address]       [phone] 3.   911  4.   Visiting Nearest ED.    Subjective:   Sharon Bryan participated from home, via video, is aware of the limits of tele-health sessions, and consented to treatment. Therapist participated from home office. We met online due to COVID pandemic. English reviewed the events of the past week. Sharon Bryan noted a recent change in her medication noting she was prescribed Modafinil 100 mg and Nortriptyline 10 mg- 40 mg. She noted this helping her ability to stay awake during the day and manage middle insomnia, respectively. She noted improvement in her partner's efforts around the home and being more proactive completion. Sharon Bryan expressed gratitude regarding this and we discussed ways to provide positive feedback and continue working on communication and consistency. She noted  negative self-talk relating to her mother's previous statements of working while sitting is "lazy". We explored this during the session and discussed the effect of this message and self-talk on her mood and engagement in activities. We will continue to process this going forward. She discussed her interest in couples counseling and noted having a therapist in-mind. Therapist praised Sharon Bryan for her effort in the session and her engagement. Therapist validated and normalized her feelings and provided supportive therapy. A follow-up was scheduled for continued treatment. s engaged and motivated during the session and expressed commitment towards our goals. Therapist provided supportive therapy.  She continues to benefit from treatment.   Interventions: Interpersonal & CBT  Diagnosis:  Other depression  Psychiatric Treatment: Yes , via PCP Dr. Sherrie Mustache.   Treatment Plan:  Client Abilities/Strengths Happiness is self-ware, motivated for change, and flexible.   Support System: Husband and family.   Client Treatment Preferences Outpatient Therapy.   Client Statement of Needs Sharon Bryan would like to engage in enjoyable activities, managing her overall mood and symptoms, improving self-talk, resolve relationship stressors, improve communication and process past events.  Treatment Level Weekly  Symptoms  Depression: loss of interest, feeling down, middle insomnia, lethargy, poor appetite, feeling bad about self, difficulty concentrating,  psycho-motor agitation, no SI.     (Status: maintained) Anxiety: Anxious, difficulty managing worry, worrying about different things, restlessness, easily annoyed, feeling afraid as if something bad might happen.    (Status: maintained)  Goals:   [place] experiences symptoms of depression and anxiety.    Target Date: 01/09/23 Frequency:  Weekly  Progress: 0 Modality: individual    Therapist will provide referrals for additional resources as appropriate.   Therapist will provide psycho-education regarding Sharon Bryan's diagnosis and corresponding treatment approaches and interventions. Licensed Clinical Social Worker, [place], LCSW will support the patient's ability to achieve the goals identified. will employ CBT, BA, Problem-solving, Solution Focused, Mindfulness,  coping skills, & other evidenced-based practices will be used to promote progress towards healthy functioning to help manage decrease symptoms associated with her diagnosis.   Reduce overall level, frequency, and intensity of the feelings of depression, anxiety and panic evidenced by decreased overall symptoms from 6 to 7 days/week to 0 to 1 days/week per client report for at least 3 consecutive months. Verbally express understanding of the relationship between feelings of depression, anxiety and their impact on thinking patterns and behaviors. Verbalize an understanding of the role that distorted thinking plays in creating fears, excessive worry, and ruminations.   Sharon Bryan participated in the creation of the treatment plan)   [name], LCSW

## 2022-07-16 ENCOUNTER — Encounter: Payer: Self-pay | Admitting: Family Medicine

## 2022-07-18 DIAGNOSIS — R252 Cramp and spasm: Secondary | ICD-10-CM | POA: Diagnosis not present

## 2022-07-18 DIAGNOSIS — G40909 Epilepsy, unspecified, not intractable, without status epilepticus: Secondary | ICD-10-CM | POA: Diagnosis not present

## 2022-07-19 ENCOUNTER — Other Ambulatory Visit: Payer: Self-pay | Admitting: Family Medicine

## 2022-07-19 DIAGNOSIS — M7041 Prepatellar bursitis, right knee: Secondary | ICD-10-CM

## 2022-07-19 LAB — VITAMIN D 25 HYDROXY (VIT D DEFICIENCY, FRACTURES): Vit D, 25-Hydroxy: 32.8 ng/mL (ref 30.0–100.0)

## 2022-07-19 LAB — MAGNESIUM: Magnesium: 2.2 mg/dL (ref 1.6–2.3)

## 2022-07-19 NOTE — Telephone Encounter (Signed)
Requested Prescriptions  Pending Prescriptions Disp Refills   nabumetone (RELAFEN) 750 MG tablet [Pharmacy Med Name: NABUMETONE 750MG  TABLETS] 180 tablet 2    Sig: TAKE 1 TO 2 TABLETS(750 TO 1500 MG) BY MOUTH DAILY AS NEEDED FOR LEG AND KNEE PAIN     Analgesics:  NSAIDS Failed - 07/19/2022  3:31 AM      Failed - Manual Review: Labs are only required if the patient has taken medication for more than 8 weeks.      Passed - Cr in normal range and within 360 days    Creatinine  Date Value Ref Range Status  09/18/2012 0.87 0.60 - 1.30 mg/dL Final   Creatinine, Ser  Date Value Ref Range Status  06/03/2022 0.72 0.57 - 1.00 mg/dL Final         Passed - HGB in normal range and within 360 days    Hemoglobin  Date Value Ref Range Status  06/03/2022 13.0 11.1 - 15.9 g/dL Final         Passed - PLT in normal range and within 360 days    Platelets  Date Value Ref Range Status  06/03/2022 267 150 - 450 x10E3/uL Final         Passed - HCT in normal range and within 360 days    Hematocrit  Date Value Ref Range Status  06/03/2022 37.3 34.0 - 46.6 % Final         Passed - eGFR is 30 or above and within 360 days    EGFR (African American)  Date Value Ref Range Status  09/18/2012 >60  Final   GFR calc Af Amer  Date Value Ref Range Status  02/25/2020 117 >59 mL/min/1.73 Final    Comment:    **In accordance with recommendations from the NKF-ASN Task force,**   [facility] is in the process of updating its eGFR calculation to the   2021 CKD-EPI creatinine equation that estimates kidney function   without a race variable.    EGFR (Non-African Amer.)  Date Value Ref Range Status  09/18/2012 >60  Final    Comment:    eGFR values <27mL/min/1.73 m2 may be an indication of chronic kidney disease (CKD). Calculated eGFR is useful in patients with stable renal function. The eGFR calculation will not be reliable in acutely ill patients when serum creatinine is changing rapidly. It is not useful  in  patients on dialysis. The eGFR calculation may not be applicable to patients at the low and high extremes of body sizes, pregnant women, and vegetarians.    GFR calc non Af Amer  Date Value Ref Range Status  02/25/2020 101 >59 mL/min/1.73 Final   eGFR  Date Value Ref Range Status  06/03/2022 99 >59 mL/min/1.73 Final         Passed - Patient is not pregnant      Passed - Valid encounter within last 12 months    Recent Outpatient Visits           1 week ago Excessive daytime sleepiness   [place] [facility] Malva Limes, MD   1 month ago Excessive daytime sleepiness   [place] [facility] Malva Limes, MD   2 months ago Acute non-recurrent frontal sinusitis   [place] [facility] Malva Limes, MD   3 months ago Primary osteoarthritis of both knees   [facility] Malva Limes, MD   4 months ago Excessive daytime sleepiness  [facility] Sherrie Mustache, Demetrios Isaacs, MD       Future Appointments             In 2 weeks Deirdre Evener, MD [facility]

## 2022-07-21 ENCOUNTER — Telehealth: Payer: Self-pay

## 2022-07-21 NOTE — Telephone Encounter (Signed)
Copied from CRM [id number]. Topic: General - Other >> Jul 21, 2022  8:44 AM Franchot Heidelberg wrote: Reason for CRM: Kylie from [facility] pre-service center called to request a pre-cert Prior Authorization for the patient's CT of the head, required by insurance.   Best contact: [phone] ext. [phone] Fax: [phone]

## 2022-07-22 ENCOUNTER — Ambulatory Visit
Admission: RE | Admit: 2022-07-22 | Discharge: 2022-07-22 | Disposition: A | Discharge status: Home / Self Care | Payer: Medicare HMO | Source: Ambulatory Visit | Attending: Family Medicine | Admitting: Family Medicine

## 2022-07-22 DIAGNOSIS — R519 Headache, unspecified: Secondary | ICD-10-CM | POA: Insufficient documentation

## 2022-07-22 DIAGNOSIS — G9389 Other specified disorders of brain: Secondary | ICD-10-CM | POA: Diagnosis not present

## 2022-07-22 DIAGNOSIS — G44099 Other trigeminal autonomic cephalgias (TAC), not intractable: Secondary | ICD-10-CM | POA: Diagnosis not present

## 2022-07-22 MED ORDER — IOHEXOL 300 MG/ML  SOLN
75.0000 mL | Freq: Once | INTRAMUSCULAR | Status: AC | PRN
  Administered 2022-07-22: 75 mL via INTRAVENOUS

## 2022-07-25 ENCOUNTER — Encounter: Payer: Self-pay | Admitting: Family Medicine

## 2022-07-25 DIAGNOSIS — E559 Vitamin D deficiency, unspecified: Secondary | ICD-10-CM

## 2022-07-27 DIAGNOSIS — E559 Vitamin D deficiency, unspecified: Secondary | ICD-10-CM | POA: Insufficient documentation

## 2022-07-27 MED ORDER — VITAMIN D3 125 MCG (5000 UT) PO CAPS: 5000.0000 [IU] | ORAL_CAPSULE | Freq: Every day | ORAL | Status: AC

## 2022-08-02 ENCOUNTER — Other Ambulatory Visit: Payer: Self-pay | Admitting: Family Medicine

## 2022-08-02 ENCOUNTER — Other Ambulatory Visit: Payer: Self-pay

## 2022-08-02 DIAGNOSIS — R7989 Other specified abnormal findings of blood chemistry: Secondary | ICD-10-CM | POA: Diagnosis not present

## 2022-08-04 ENCOUNTER — Ambulatory Visit: Payer: Medicare HMO | Admitting: Dermatology

## 2022-08-05 ENCOUNTER — Ambulatory Visit (INDEPENDENT_AMBULATORY_CARE_PROVIDER_SITE_OTHER): Payer: Medicare HMO | Admitting: Psychology

## 2022-08-05 DIAGNOSIS — F3289 Other specified depressive episodes: Secondary | ICD-10-CM | POA: Diagnosis not present
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
## 2022-08-06 ENCOUNTER — Other Ambulatory Visit: Payer: Self-pay | Admitting: Family Medicine

## 2022-08-06 DIAGNOSIS — G40909 Epilepsy, unspecified, not intractable, without status epilepticus: Secondary | ICD-10-CM

## 2022-08-11 LAB — CORTISOL, URINE, FREE

## 2022-08-15 ENCOUNTER — Encounter: Payer: Self-pay | Admitting: Family Medicine

## 2022-08-16 LAB — CORTISOL, URINE, FREE: Cortisol,F,ug/L,U: 18 ug/L

## 2022-08-19 ENCOUNTER — Encounter: Payer: Self-pay | Admitting: Family Medicine

## 2022-08-19 ENCOUNTER — Telehealth (INDEPENDENT_AMBULATORY_CARE_PROVIDER_SITE_OTHER): Payer: Medicare HMO | Admitting: Family Medicine

## 2022-08-19 DIAGNOSIS — R6889 Other general symptoms and signs: Secondary | ICD-10-CM | POA: Diagnosis not present

## 2022-08-19 DIAGNOSIS — G4719 Other hypersomnia: Secondary | ICD-10-CM | POA: Diagnosis not present

## 2022-08-19 DIAGNOSIS — R635 Abnormal weight gain: Secondary | ICD-10-CM | POA: Diagnosis not present

## 2022-08-22 ENCOUNTER — Ambulatory Visit: Payer: Medicare HMO | Admitting: Dermatology

## 2022-08-22 MED ORDER — MODAFINIL 200 MG PO TABS: 200.0000 mg | ORAL_TABLET | Freq: Every morning | ORAL | 3 refills | Status: DC

## 2022-08-23 ENCOUNTER — Other Ambulatory Visit: Payer: Self-pay | Admitting: Family Medicine

## 2022-08-23 DIAGNOSIS — R519 Headache, unspecified: Secondary | ICD-10-CM

## 2022-08-23 NOTE — Telephone Encounter (Signed)
Requested Prescriptions  Pending Prescriptions Disp Refills   nortriptyline (PAMELOR) 10 MG capsule [Pharmacy Med Name: NORTRIPTYLINE 10MG  CAPSULES] 60 capsule 1    Sig: TAKE 1 TO 4 CAPSULES(10 TO 40 MG) BY MOUTH AT BEDTIME. MAY TITRATE DOSE UP EVERY 4 DAYS AS NEEDED. DO NOT TAKE AMITRIPTYLINE     Psychiatry:  Antidepressants - Heterocyclics (TCAs) Passed - 08/23/2022  3:30 AM      Passed - Completed PHQ-2 or PHQ-9 in the last 360 days      Passed - Valid encounter within last 6 months    Recent Outpatient Visits           4 days ago Unexplained weight gain   [facility] Malva Limes, MD   1 month ago Excessive daytime sleepiness   [facility] Malva Limes, MD   2 months ago Excessive daytime sleepiness   [facility] Malva Limes, MD   3 months ago Acute non-recurrent frontal sinusitis   [facility] Malva Limes, MD   4 months ago Primary osteoarthritis of both knees   [facility] Malva Limes, MD

## 2022-08-24 ENCOUNTER — Telehealth: Payer: Self-pay | Admitting: Family Medicine

## 2022-08-24 NOTE — Telephone Encounter (Signed)
Please check with [facility] [facility] and see if they are processing order CPAP that was sent back on July 12th, had appt with patient on Friday and she said they hadn't heard anything from DME company.

## 2022-08-26 ENCOUNTER — Ambulatory Visit (INDEPENDENT_AMBULATORY_CARE_PROVIDER_SITE_OTHER): Payer: Medicare HMO | Admitting: Psychology

## 2022-08-26 DIAGNOSIS — F3289 Other specified depressive episodes: Secondary | ICD-10-CM | POA: Diagnosis not present
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
## 2022-09-01 NOTE — Progress Notes (Signed)
MyChart Video Visit    Virtual Visit via Video Note   This format is felt to be most appropriate for this patient at this time. Physical exam was limited by quality of the video and audio technology used for the visit.   Patient location: home Provider location: [facility]  I discussed the limitations of evaluation and management by telemedicine and the availability of in person appointments. The patient expressed understanding and agreed to proceed.  Patient: Sharon Bryan   DOB: [birth year]   57 y.o. Female  MRN: [id number] Visit Date: 08/19/2022  Today's healthcare provider: Mila Merry, MD   Chief Complaint  Patient presents with   Fatigue    For the past few months    Results    Would like to speak about her test results   Subjective    Discussed the use of AI scribe software for clinical note transcription with the patient, who gave verbal consent to proceed.  History of Present Illness   The patient, with a history of sleep apnea, presents with persistent excessive daytime sleepiness despite being on Modafinil 100mg  daily. She reports no significant improvement in her symptoms, with an episode of sleeping for approximately 36 hours recently. She has not yet received her CPAP machine, which was ordered a couple of weeks ago.  In addition to sleep issues, the patient has been experiencing unexplained weight gain, particularly in the core and behind the neck, bruising easily, and constant fatigue. She reports a significant decrease in activity level, including not being able to see her grandchildren this summer, which is unusual for her. She denies overeating or consuming unhealthy foods.  The patient's symptoms have raised concerns about a possible diagnosis of Cushing's syndrome, although previous testing results were borderline. The patient expresses a strong desire to understand the cause of her symptoms.       Medications: Outpatient Medications Prior to Visit   Medication Sig   ALPRAZolam (XANAX) 1 MG tablet Take 1/2 tablet once or twice a day as needed for anxiety and panic attacks   Aspirin-Acetaminophen-Caffeine (EXCEDRIN PO) Take 1 tablet daily as needed by mouth.   busPIRone (BUSPAR) 30 MG tablet TAKE 1 TABLET BY MOUTH TWICE DAILY   Cholecalciferol (VITAMIN D3) 125 MCG (5000 UT) CAPS Take 1 capsule (5,000 Units total) by mouth daily.   dexamethasone (DECADRON) 1 MG tablet Take one tablet at night, 9-10 hours before having blood drawn the next morning   DULoxetine (CYMBALTA) 60 MG capsule Take 1 capsule (60 mg total) by mouth daily.   estradiol (ESTRACE) 2 MG tablet TAKE 1 TABLET(2 MG) BY MOUTH DAILY   fluticasone (FLONASE) 50 MCG/ACT nasal spray Place 2 sprays into both nostrils daily.   lamoTRIgine (LAMICTAL) 100 MG tablet TAKE 1 TABLET BY MOUTH TWICE DAILY   levothyroxine (SYNTHROID) 75 MCG tablet TAKE 1 TABLET(75 MCG) BY MOUTH DAILY   Magnesium Oxide 420 MG TABS Take 400 mg by mouth daily.   mometasone (ELOCON) 0.1 % cream Apply twice daily to affected body areas 5 days per week as needed. Avoid applying to face, groin, and axilla   Multiple Vitamins-Minerals (CENTRUM SILVER PO) Take by mouth.   nabumetone (RELAFEN) 750 MG tablet TAKE 1 TO 2 TABLETS(750 TO 1500 MG) BY MOUTH DAILY AS NEEDED FOR LEG AND KNEE PAIN   neomycin-polymyxin-hydrocortisone (CORTISPORIN) 3.5-10000-1 OTIC suspension SHAKE LIQUID AND INSTILL 3 TO 4 DROPS TO AFFECTED EAR FOUR TIMES DAILY   omeprazole (PRILOSEC) 20 MG capsule TAKE 1  CAPSULE BY MOUTH EVERY DAY   PARoxetine (PAXIL) 30 MG tablet Take 1 tablet (30 mg total) by mouth daily.   phenytoin (DILANTIN) 100 MG ER capsule Take 2 capsules (200 mg total) by mouth every morning AND 3 capsules (300 mg total) every evening.   pregabalin (LYRICA) 225 MG capsule Take 1 capsule (225 mg total) by mouth 2 (two) times daily. (Patient taking differently: Take 225 mg by mouth 2 (two) times daily. For fibromyalgia)   traMADol (ULTRAM)  50 MG tablet Take 1 tablet (50 mg total) by mouth every 8 (eight) hours as needed.   XIFAXAN 550 MG TABS tablet TAKE 1 TABLET BY MOUTH THREE TIMES DAILY   [DISCONTINUED] modafinil (PROVIGIL) 100 MG tablet Take 1 tablet (100 mg total) by mouth every morning.   [DISCONTINUED] nortriptyline (PAMELOR) 10 MG capsule Take 1-4 capsules (10-40 mg total) by mouth at bedtime. May titrate dose up every 4 days as needed. Do NOT take amitriptyline   No facility-administered medications prior to visit.      Objective    LMP 04/26/2002 (LMP Unknown)   Physical Exam     Awake, alert, oriented x 3. In no apparent distress    Assessment & Plan     Assessment and Plan    Excessive Daytime Sleepiness: Persistent despite Modafinil 100mg  daily. No reported side effects from Modafinil. -Increase Modafinil to 200mg  daily. -Continue current dose by taking two 100mg  tablets until new prescription is filled.  Obstructive Sleep Apnea: Significant sleep apnea noted on sleep study. CPAP not yet set up. -Contact [facility] to expedite CPAP setup.  Possible Cushing's Syndrome: Bruising, central weight gain, and fatigue. Previous testing borderline. -Refer to Endocrinology for further evaluation and testing.         I discussed the assessment and treatment plan with the patient. The patient was provided an opportunity to ask questions and all were answered. The patient agreed with the plan and demonstrated an understanding of the instructions.   The patient was advised to call back or seek an in-person evaluation if the symptoms worsen or if the condition fails to improve as anticipated.  I provided 9 minutes of non-face-to-face time during this encounter.    Mila Merry, MD [facility] [phone] (phone) [phone] (fax)  [facility]

## 2022-09-05 ENCOUNTER — Ambulatory Visit (INDEPENDENT_AMBULATORY_CARE_PROVIDER_SITE_OTHER): Payer: Medicare HMO | Admitting: Psychology

## 2022-09-05 ENCOUNTER — Other Ambulatory Visit: Payer: Self-pay | Admitting: Family Medicine

## 2022-09-05 DIAGNOSIS — F3289 Other specified depressive episodes: Secondary | ICD-10-CM | POA: Diagnosis not present

## 2022-09-05 NOTE — Progress Notes (Signed)
[facility] Counselor/Therapist Progress Note  Patient ID: Sharon Bryan, MRN: [id number]   Date: 09/05/22  Time Spent: 11:35 am - 12:30 pm : 55 Minutes  Treatment Type: Individual Therapy.  Reported Symptoms: depression and anxiety.   Mental Status Exam: Appearance:  Casual     Behavior: Appropriate  Motor: Normal  Speech/Language:  Normal Rate  Affect: Congruent  Mood: anxious and dysthymic  Thought process: normal  Thought content:   WNL  Sensory/Perceptual disturbances:   WNL  Orientation: oriented to person, place, time/date, and situation  Attention: Good  Concentration: Good  Memory: WNL  Fund of knowledge:  Good  Insight:   Good  Judgment:  Good  Impulse Control: Good   Risk Assessment: Danger to Self:  No Self-injurious Behavior: No Danger to Others: No Duty to Warn:no Physical Aggression / Violence:No  Access to Firearms a concern: No  Gang Involvement:No   In case of a mental health emergency:  56 - confidential suicide hotline. [facility]        [address], [place] [address]       [phone] 3.   911  4.   Visiting Nearest ED.    Subjective:   Sharon Bryan participated from home, via video, is aware of the limits of tele-health sessions, and consented to treatment. Therapist participated from home office. Sharon Bryan noted having a stressful morning and noted that "it's not been a good day". She discussed relationship stressors and a lack of support, She noted difficulty navigating this situation and noted a recent disagreement with her partner due to not waking him for work, despite him setting and canceling an alarm. We worked on identifying roles in the relationship. Specifically, she noted a lack of financial transparency, compromise, and ability to make decisions within the relationships. Additionally, she noted a lack of follow through on verbal commitments by her  partner, Sharon Bryan, regarding various tasks within the home. We worked on identifying her needs and what she would like to do to address this going forward. We reviewed communication, assertiveness, and conflict resolution. Therapist modeled this during the session. Sharon Bryan was engaged and motivated during the session. She expressed commitment towards her goals. Therapist praised Sharon Bryan for her energy, effort, and vulnerability. Therapist validated Sharon Bryan's experience and provided supportive therapy. A follow-up was scheduled for continued treatment, which she benefits from.   Interventions: Interpersonal & CBT  Diagnosis:  Other depression  Psychiatric Treatment: Yes , via PCP Dr. Sherrie Mustache.   Treatment Plan:  Client Abilities/Strengths Sharon Bryan is self-ware, motivated for change, and flexible.   Support System: Husband and family.   Client Treatment Preferences Outpatient Therapy.   Client Statement of Needs Sharon Bryan would like to engage in enjoyable activities, managing her overall mood and symptoms, improving self-talk, resolve relationship stressors, improve communication and process past events.  Treatment Level Weekly  Symptoms  Depression: loss of interest, feeling down, middle insomnia, lethargy, poor appetite, feeling bad about self, difficulty concentrating,  psycho-motor agitation, no SI.     (Status: maintained) Anxiety: Anxious, difficulty managing worry, worrying about different things, restlessness, easily annoyed, feeling afraid as if something bad might happen.    (Status: maintained)  Goals:   Amiee experiences symptoms of depression and anxiety.    Target Date: 01/09/23 Frequency: Weekly  Progress: 0 Modality: individual    Therapist will provide referrals for additional resources as appropriate.  Therapist will provide psycho-education regarding Sharon Bryan's diagnosis and corresponding treatment  approaches and interventions. Licensed Clinical Social Worker, [place], LCSW will support the patient's ability to achieve the goals identified. will employ CBT, BA, Problem-solving, Solution Focused, Mindfulness,  coping skills, & other evidenced-based practices will be used to promote progress towards healthy functioning to help manage decrease symptoms associated with her diagnosis.   Reduce overall level, frequency, and intensity of the feelings of depression, anxiety and panic evidenced by decreased overall symptoms from 6 to 7 days/week to 0 to 1 days/week per client report for at least 3 consecutive months. Verbally express understanding of the relationship between feelings of depression, anxiety and their impact on thinking patterns and behaviors. Verbalize an understanding of the role that distorted thinking plays in creating fears, excessive worry, and ruminations.   Sharon Bryan participated in the creation of the treatment plan)   [name], LCSW

## 2022-09-07 ENCOUNTER — Telehealth: Payer: Self-pay | Admitting: Family Medicine

## 2022-09-07 NOTE — Telephone Encounter (Signed)
[facility] pharmacy is requesting prescription refill lamoTRIgine (LAMICTAL) 100 MG tablet  Please advise

## 2022-09-12 ENCOUNTER — Other Ambulatory Visit: Payer: Self-pay | Admitting: Family Medicine

## 2022-09-12 ENCOUNTER — Encounter: Payer: Self-pay | Admitting: Family Medicine

## 2022-09-12 NOTE — Telephone Encounter (Signed)
Medication Refill - Medication: Lyrica 225 mg twice a day.  Pt is completely out.  Ask asap refill  Has the patient contacted their pharmacy? Yes.   (Agent: If no, request that the patient contact the pharmacy for the refill. If patient does not wish to contact the pharmacy document the reason why and proceed with request.) (Agent: If yes, when and what did the pharmacy advise?)  Preferred Pharmacy (with phone number or street name): [facility] [address] Has the patient been seen for an appointment in the last year OR does the patient have an upcoming appointment? Yes.    Agent: Please be advised that RX refills may take up to 3 business days. We ask that you follow-up with your pharmacy.

## 2022-09-13 ENCOUNTER — Other Ambulatory Visit: Payer: Self-pay | Admitting: Family Medicine

## 2022-09-13 ENCOUNTER — Ambulatory Visit: Payer: Self-pay | Admitting: *Deleted

## 2022-09-13 ENCOUNTER — Encounter: Payer: Self-pay | Admitting: Family Medicine

## 2022-09-13 MED ORDER — PREGABALIN 225 MG PO CAPS: 225.0000 mg | ORAL_CAPSULE | Freq: Two times a day (BID) | ORAL | 5 refills | Status: DC

## 2022-09-13 NOTE — Telephone Encounter (Signed)
Medication Refill - Medication: pregabalin (LYRICA) 225 MG capsule   Has the patient contacted their pharmacy? No. (Agent: If no, request that the patient contact the pharmacy for the refill. If patient does not wish to contact the pharmacy document the reason why and proceed with request.)  Preferred Pharmacy (with phone number or street name):  [facility] [address], [place] - [address]  [address] [place] [address]  Phone: [phone] Fax: [phone]  Hours: Not open 24 hours   Has the patient been seen for an appointment in the last year OR does the patient have an upcoming appointment? Yes.    Agent: Please be advised that RX refills may take up to 3 business days. We ask that you follow-up with your pharmacy.

## 2022-09-13 NOTE — Telephone Encounter (Signed)
Requested medication (s) are due for refill today - no  Requested medication (s) are on the active medication list -yes  Future visit scheduled -yes  Last refill: 03/10/22 #180 5RF  Notes to clinic: non delegated Rx  Requested Prescriptions  Pending Prescriptions Disp Refills   pregabalin (LYRICA) 225 MG capsule 180 capsule 5    Sig: Take 1 capsule (225 mg total) by mouth 2 (two) times daily.     Not Delegated - Neurology:  Anticonvulsants - Controlled - pregabalin Failed - 09/12/2022  1:09 PM      Failed - This refill cannot be delegated      Passed - Cr in normal range and within 360 days    Creatinine  Date Value Ref Range Status  09/18/2012 0.87 0.60 - 1.30 mg/dL Final   Creatinine, Ser  Date Value Ref Range Status  06/03/2022 0.72 0.57 - 1.00 mg/dL Final         Passed - Completed PHQ-2 or PHQ-9 in the last 360 days      Passed - Valid encounter within last 12 months    Recent Outpatient Visits           3 weeks ago Unexplained weight gain   [facility] Malva Limes, MD   2 months ago Excessive daytime sleepiness   [place] [facility] Malva Limes, MD   3 months ago Excessive daytime sleepiness   [facility] Malva Limes, MD   3 months ago Acute non-recurrent frontal sinusitis   [place] Malva Limes, MD   5 months ago Primary osteoarthritis of both knees   [place] [facility] Malva Limes, MD       Future Appointments             Tomorrow Malva Limes, MD [facility]   In 3 weeks Erroll Luna, [place], MD [facility] Endocrinology               Requested Prescriptions  Pending Prescriptions Disp Refills   pregabalin (LYRICA) 225 MG capsule 180 capsule 5    Sig: Take 1 capsule (225 mg total) by mouth 2 (two) times daily.     Not Delegated - Neurology:  Anticonvulsants -  Controlled - pregabalin Failed - 09/12/2022  1:09 PM      Failed - This refill cannot be delegated      Passed - Cr in normal range and within 360 days    Creatinine  Date Value Ref Range Status  09/18/2012 0.87 0.60 - 1.30 mg/dL Final   Creatinine, Ser  Date Value Ref Range Status  06/03/2022 0.72 0.57 - 1.00 mg/dL Final         Passed - Completed PHQ-2 or PHQ-9 in the last 360 days      Passed - Valid encounter within last 12 months    Recent Outpatient Visits           3 weeks ago Unexplained weight gain   [facility] Malva Limes, MD   2 months ago Excessive daytime sleepiness   [facility] Malva Limes, MD   3 months ago Excessive daytime sleepiness    [facility] Malva Limes, MD   3 months ago Acute non-recurrent frontal sinusitis   [facility] Malva Limes, MD   5  months ago Primary osteoarthritis of both knees   [place], Demetrios Isaacs, MD       Future Appointments             Tomorrow Sherrie Mustache, Demetrios Isaacs, MD [facility], [facility]   In 3 weeks Thapa, [place], MD [facility] Endocrinology

## 2022-09-13 NOTE — Telephone Encounter (Signed)
  Chief Complaint: medication refill, Lyrica  , possible withdrawal sx per pt spouse on DPR Symptoms: muscle pain, N/V shaking, tremors. Reports out of lyrica and needs refill today last dose taking yesterday  Frequency: since Saturday  Pertinent Negatives: Patient denies chest pain no difficulty breathing Disposition: [] ED /[] Urgent Care (no appt availability in office) / [] Appointment(In office/virtual)/ []  [place] [facility]/ [] Home Care/ [x] Refused Recommended Disposition /[] [place] [facility]/ []  Follow-up with PCP Additional Notes:   Recommended appt  and DPR requesting only to have lyrica refilled as requested multiple times. Recommended if sx worsen go to ED. Please call in Rx refill to [facility] pharmacy on [address] [place] due to specific brand not in stock at other pharmacies. Please advise.     Reason for Disposition  [1] Caller has URGENT medicine question about med that PCP or specialist prescribed AND [2] triager unable to answer question  Answer Assessment - Initial Assessment Questions 1. NAME of MEDICINE: "What medicine(s) are you calling about?"     Lyrica  2. QUESTION: "What is your question?" (e.g., double dose of medicine, side effect)     Patient spouse on DPR requesting lyrica to be called in to pharmacy due to patient only can take DAW lyrica. Only sent Rx to [facility] pharmacy  [address] 3. PRESCRIBER: "Who prescribed the medicine?" Reason: if prescribed by specialist, call should be referred to that group.     PCP 4. SYMPTOMS: "Do you have any symptoms?" If Yes, ask: "What symptoms are you having?"  "How bad are the symptoms (e.g., mild, moderate, severe)     Shakey, muscle pain, N/V 5. PREGNANCY:  "Is there any chance that you are pregnant?" "When was your last menstrual period?"     na  Protocols used: Medication Question Call-A-AH

## 2022-09-14 ENCOUNTER — Telehealth: Payer: Self-pay | Admitting: Family Medicine

## 2022-09-14 ENCOUNTER — Telehealth (INDEPENDENT_AMBULATORY_CARE_PROVIDER_SITE_OTHER): Payer: Medicare HMO | Admitting: Family Medicine

## 2022-09-14 DIAGNOSIS — G4733 Obstructive sleep apnea (adult) (pediatric): Secondary | ICD-10-CM | POA: Diagnosis not present

## 2022-09-14 DIAGNOSIS — M797 Fibromyalgia: Secondary | ICD-10-CM | POA: Diagnosis not present

## 2022-09-14 DIAGNOSIS — N390 Urinary tract infection, site not specified: Secondary | ICD-10-CM | POA: Diagnosis not present

## 2022-09-14 MED ORDER — CEPHALEXIN 500 MG PO CAPS: 500.0000 mg | ORAL_CAPSULE | Freq: Four times a day (QID) | ORAL | 0 refills | Status: AC

## 2022-09-14 NOTE — Telephone Encounter (Signed)
Requested medication (s) are due for refill today: No  Requested medication (s) are on the active medication list: Yes  Last refill:  09/13/22 # 180 with 5 refills  Future visit scheduled: No  Notes to clinic:  Not delegated.    Requested Prescriptions  Pending Prescriptions Disp Refills   pregabalin (LYRICA) 225 MG capsule 180 capsule 5    Sig: Take 1 capsule (225 mg total) by mouth 2 (two) times daily.     Not Delegated - Neurology:  Anticonvulsants - Controlled - pregabalin Failed - 09/13/2022 12:41 PM      Failed - This refill cannot be delegated      Passed - Cr in normal range and within 360 days    Creatinine  Date Value Ref Range Status  09/18/2012 0.87 0.60 - 1.30 mg/dL Final   Creatinine, Ser  Date Value Ref Range Status  06/03/2022 0.72 0.57 - 1.00 mg/dL Final         Passed - Completed PHQ-2 or PHQ-9 in the last 360 days      Passed - Valid encounter within last 12 months    Recent Outpatient Visits           Today    [facility] Malva Limes, MD   3 weeks ago Unexplained weight gain   [facility] Malva Limes, MD   2 months ago Excessive daytime sleepiness   [facility] Malva Limes, MD   3 months ago Excessive daytime sleepiness   [place] [facility] Malva Limes, MD   3 months ago Acute non-recurrent frontal sinusitis   [facility] Malva Limes, MD       Future Appointments             In 2 weeks Thapa, [place], MD [facility] Endocrinology

## 2022-09-14 NOTE — Progress Notes (Signed)
MyChart Video Visit    Virtual Visit via Video Note   This format is felt to be most appropriate for this patient at this time. Physical exam was limited by quality of the video and audio technology used for the visit.   Patient location: home Provider location: bfp  I discussed the limitations of evaluation and management by telemedicine and the availability of in person appointments. The patient expressed understanding and agreed to proceed.  Patient: Sharon Bryan   DOB: [birth year]   57 y.o. Female  MRN: [id number] Visit Date: 09/14/2022  Today's healthcare provider: Mila Merry, MD   Chief Complaint  Patient presents with   Urinary Tract Infection    Patient states she has been having symptoms for 1 week.  She has been having dysuria, frequency, decreased urine flow,    Subjective    History of Present Illness   The patient, with a history of chronic pain managed with Lyrica, presented with symptoms suggestive of a urinary tract infection (UTI). She reported a sensation of needing to urinate frequently, but with difficulty in voiding and associated burning pain. The patient suspected the onset of symptoms might be related to recent swimming activities with her grandchildren. Despite attempts to self-manage the symptoms with cranberry juice, the symptoms persisted for a week.  In addition to the UTI symptoms, the patient experienced withdrawal symptoms from Lyrica due to a temporary discontinuation of the medication. She had been without Lyrica for three days and had been on a half dose for two days prior due to difficulty in obtaining the medication. The withdrawal symptoms were severe enough to cause significant discomfort and illness.  The patient denied any allergies to antibiotics. She also mentioned an ongoing issue with obtaining a CPAP machine, but no further details were provided.       Medications: Outpatient Medications Prior to Visit  Medication Sig    ALPRAZolam (XANAX) 1 MG tablet Take 1/2 tablet once or twice a day as needed for anxiety and panic attacks   Aspirin-Acetaminophen-Caffeine (EXCEDRIN PO) Take 1 tablet daily as needed by mouth.   busPIRone (BUSPAR) 30 MG tablet TAKE 1 TABLET BY MOUTH TWICE DAILY   cephALEXin (KEFLEX) 500 MG capsule Take 1 capsule (500 mg total) by mouth 4 (four) times daily for 7 days.   Cholecalciferol (VITAMIN D3) 125 MCG (5000 UT) CAPS Take 1 capsule (5,000 Units total) by mouth daily.   dexamethasone (DECADRON) 1 MG tablet Take one tablet at night, 9-10 hours before having blood drawn the next morning   DULoxetine (CYMBALTA) 60 MG capsule Take 1 capsule (60 mg total) by mouth daily.   estradiol (ESTRACE) 2 MG tablet TAKE 1 TABLET(2 MG) BY MOUTH DAILY   fluticasone (FLONASE) 50 MCG/ACT nasal spray Place 2 sprays into both nostrils daily.   lamoTRIgine (LAMICTAL) 100 MG tablet TAKE 1 TABLET BY MOUTH TWICE DAILY   levothyroxine (SYNTHROID) 75 MCG tablet TAKE 1 TABLET(75 MCG) BY MOUTH DAILY   Magnesium Oxide 420 MG TABS Take 400 mg by mouth daily.   modafinil (PROVIGIL) 200 MG tablet Take 1 tablet (200 mg total) by mouth every morning.   mometasone (ELOCON) 0.1 % cream Apply twice daily to affected body areas 5 days per week as needed. Avoid applying to face, groin, and axilla   Multiple Vitamins-Minerals (CENTRUM SILVER PO) Take by mouth.   nabumetone (RELAFEN) 750 MG tablet TAKE 1 TO 2 TABLETS(750 TO 1500 MG) BY MOUTH DAILY AS NEEDED FOR LEG  AND KNEE PAIN   neomycin-polymyxin-hydrocortisone (CORTISPORIN) 3.5-10000-1 OTIC suspension SHAKE LIQUID AND INSTILL 3 TO 4 DROPS TO AFFECTED EAR FOUR TIMES DAILY   nortriptyline (PAMELOR) 10 MG capsule TAKE 1 TO 4 CAPSULES(10 TO 40 MG) BY MOUTH AT BEDTIME. MAY TITRATE DOSE UP EVERY 4 DAYS AS NEEDED. DO NOT TAKE AMITRIPTYLINE   omeprazole (PRILOSEC) 20 MG capsule TAKE 1 CAPSULE BY MOUTH EVERY DAY   PARoxetine (PAXIL) 30 MG tablet Take 1 tablet (30 mg total) by mouth daily.    phenytoin (DILANTIN) 100 MG ER capsule Take 2 capsules (200 mg total) by mouth every morning AND 3 capsules (300 mg total) every evening.   pregabalin (LYRICA) 225 MG capsule Take 1 capsule (225 mg total) by mouth 2 (two) times daily.   traMADol (ULTRAM) 50 MG tablet Take 1 tablet (50 mg total) by mouth every 8 (eight) hours as needed.   XIFAXAN 550 MG TABS tablet TAKE 1 TABLET BY MOUTH THREE TIMES DAILY   No facility-administered medications prior to visit.      Objective    LMP 04/26/2002 (LMP Unknown)   Physical Exam  Awake, alert, oriented x 3. In no apparent distress    Assessment & Plan     Assessment and Plan    Urinary Tract Infection Reports dysuria, frequency, and incomplete voiding. No systemic symptoms. History of recurrent UTIs. -Prescribe antibiotic, send to [facility]. -If symptoms not improved in 3-4 days, patient to come in for urine culture.  Lyrica Withdrawal Reports withdrawal symptoms after running out of Lyrica. Currently on a lower dose until full dose available. -Continue lower dose until full dose available at [facility] on Friday.  Sleep Apnea Awaiting CPAP machine, delay in delivery. -Contact CPAP provider to follow up on delay.          I discussed the assessment and treatment plan with the patient. The patient was provided an opportunity to ask questions and all were answered. The patient agreed with the plan and demonstrated an understanding of the instructions.   The patient was advised to call back or seek an in-person evaluation if the symptoms worsen or if the condition fails to improve as anticipated.  I provided 9 minutes of non-face-to-face time during this encounter.    Mila Merry, MD [facility] [phone] (phone) [phone] (fax)  [facility]

## 2022-09-14 NOTE — Telephone Encounter (Signed)
Order for CPAP was sent to [facility] on the 6th and patient has still not heard anything. Please contact [facility] and see what the hold up is, she has been waiting on CPAP for months.

## 2022-09-15 ENCOUNTER — Ambulatory Visit (INDEPENDENT_AMBULATORY_CARE_PROVIDER_SITE_OTHER): Payer: Medicare HMO | Admitting: Psychology

## 2022-09-15 DIAGNOSIS — F3289 Other specified depressive episodes: Secondary | ICD-10-CM | POA: Diagnosis not present

## 2022-09-15 NOTE — Progress Notes (Signed)
[facility] Counselor/Therapist Progress Note  Patient ID: ADIEL ALPERT, MRN: [id number]   Date: 09/15/22  Time Spent: 2:02 pm - 2:59  pm : 57 Minutes  Treatment Type: Individual Therapy.  Reported Symptoms: depression and anxiety.   Mental Status Exam: Appearance:  Casual     Behavior: Appropriate  Motor: Normal  Speech/Language:  Normal Rate  Affect: Congruent  Mood: anxious and dysthymic  Thought process: normal  Thought content:   WNL  Sensory/Perceptual disturbances:   WNL  Orientation: oriented to person, place, time/date, and situation  Attention: Good  Concentration: Good  Memory: WNL  Fund of knowledge:  Good  Insight:   Good  Judgment:  Good  Impulse Control: Good   Risk Assessment: Danger to Self:  No Self-injurious Behavior: No Danger to Others: No Duty to Warn:no Physical Aggression / Violence:No  Access to Firearms a concern: No  Gang Involvement:No   In case of a mental health emergency:  79 - confidential suicide hotline. [facility]):        [address], [place] [address]       [phone] 3.   911  4.   Visiting Nearest ED.    Subjective:   [name] participated from home, via video, is aware of the limits of tele-health sessions, and consented to treatment. Therapist participated from home office. She noted having a family member visit and them experiencing health issues during the visit. She noted feeling "really scared". She noted things being "okay" after that. She noted worry regarding her partner's relationship and dynamics with his therapist. She discussed her attempts to communicate concerns and noted feeling disregarded. We explored this during the session and her attempts to address this. She noted this experiencing being a reminder of a different relationship ending and noted her worry that this relationship will end the same way. We worked on  identifying ways to to communicate concerns, set boundaries, and seek to understand each other's perspective. Therapist modeled this during the session. Therapist validated and normalized [name]'s feelings during the session and encouraged [name] to work on how she would like to address this issue and focus on reducing her level of stress. We scheduled a follow-up to continue treatment. [name] was engaged and motivated and expressed commitment towards goals. Therapist provided supportive therapy.   Interventions: Interpersonal & CBT  Diagnosis:  Other depression  Psychiatric Treatment: Yes , via PCP Dr. Sherrie Mustache.   Treatment Plan:  Client Abilities/Strengths Tiaraoluwa is self-ware, motivated for change, and flexible.   Support System: Husband and family.   Client Treatment Preferences Outpatient Therapy.   Client Statement of Needs Rilla would like to engage in enjoyable activities, managing her overall mood and symptoms, improving self-talk, resolve relationship stressors, improve communication and process past events.  Treatment Level Weekly  Symptoms  Depression: loss of interest, feeling down, middle insomnia, lethargy, poor appetite, feeling bad about self, difficulty concentrating,  psycho-motor agitation, no SI.     (Status: maintained) Anxiety: Anxious, difficulty managing worry, worrying about different things, restlessness, easily annoyed, feeling afraid as if something bad might happen.    (Status: maintained)  Goals:   Bettylee experiences symptoms of depression and anxiety.    Target Date: 01/09/23 Frequency: Weekly  Progress: 0 Modality: individual    Therapist will provide referrals for additional resources as appropriate.  Therapist will provide psycho-education regarding Tabatha's diagnosis and corresponding treatment approaches and interventions. Licensed Clinical Child psychotherapist, [employer]  Shelvy Heckert, LCSW will support the patient's ability to achieve the goals  identified. will employ CBT, BA, Problem-solving, Solution Focused, Mindfulness,  coping skills, & other evidenced-based practices will be used to promote progress towards healthy functioning to help manage decrease symptoms associated with her diagnosis.   Reduce overall level, frequency, and intensity of the feelings of depression, anxiety and panic evidenced by decreased overall symptoms from 6 to 7 days/week to 0 to 1 days/week per client report for at least 3 consecutive months. Verbally express understanding of the relationship between feelings of depression, anxiety and their impact on thinking patterns and behaviors. Verbalize an understanding of the role that distorted thinking plays in creating fears, excessive worry, and ruminations.   [name] participated in the creation of the treatment plan)   [name], LCSW

## 2022-09-23 ENCOUNTER — Ambulatory Visit: Payer: Medicare HMO | Admitting: Psychology

## 2022-09-23 DIAGNOSIS — F3289 Other specified depressive episodes: Secondary | ICD-10-CM
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
## 2022-09-26 ENCOUNTER — Other Ambulatory Visit: Payer: Self-pay | Admitting: Family Medicine

## 2022-09-26 DIAGNOSIS — R519 Headache, unspecified: Secondary | ICD-10-CM

## 2022-09-28 NOTE — Telephone Encounter (Signed)
Requested medication (s) are due for refill today: routing for review  Requested medication (s) are on the active medication list: yes  Last refill:  08/23/22  Future visit scheduled: no  Notes to clinic:  Unable to refill per protocol, routing for clarification on dosage instructions.      Requested Prescriptions  Pending Prescriptions Disp Refills   nortriptyline (PAMELOR) 10 MG capsule [Pharmacy Med Name: NORTRIPTYLINE 10MG  CAPSULES] 60 capsule 1    Sig: TAKE 1 TO 4 CAPSULES(10 TO 40 MG) BY MOUTH AT BEDTIME. MAY TITRATE DOSE UP EVERY 4 DAYS AS NEEDED. DO NOT TAKE AMITRIPTYLINE     Psychiatry:  Antidepressants - Heterocyclics (TCAs) Passed - 09/26/2022  3:31 AM      Passed - Completed PHQ-2 or PHQ-9 in the last 360 days      Passed - Valid encounter within last 6 months    Recent Outpatient Visits           2 weeks ago Urinary tract infection without hematuria, site unspecified   [facility] Malva Limes, MD   1 month ago Unexplained weight gain   [facility] Malva Limes, MD   2 months ago Excessive daytime sleepiness   [facility] Malva Limes, MD   3 months ago Excessive daytime sleepiness   [facility] Malva Limes, MD   4 months ago Acute non-recurrent frontal sinusitis   [facility] Malva Limes, MD       Future Appointments             In 6 days Thapa, [place], MD [facility] Endocrinology

## 2022-09-29 ENCOUNTER — Ambulatory Visit (INDEPENDENT_AMBULATORY_CARE_PROVIDER_SITE_OTHER): Payer: Medicare HMO | Admitting: Psychology

## 2022-09-29 DIAGNOSIS — F3289 Other specified depressive episodes: Secondary | ICD-10-CM | POA: Diagnosis not present

## 2022-09-29 NOTE — Progress Notes (Signed)
[facility] Counselor/Therapist Progress Note  Patient ID: Sharon Bryan, MRN: [id number]   Date: 09/29/22  Time Spent: 3:02 pm - 3:56  pm : 54 Minutes  Treatment Type: Individual Therapy.  Reported Symptoms: depression and anxiety.   Mental Status Exam: Appearance:  Casual     Behavior: Appropriate  Motor: Normal  Speech/Language:  Normal Rate  Affect: Congruent  Mood: anxious and dysthymic  Thought process: normal  Thought content:   WNL  Sensory/Perceptual disturbances:   WNL  Orientation: oriented to person, place, time/date, and situation  Attention: Good  Concentration: Good  Memory: WNL  Fund of knowledge:  Good  Insight:   Good  Judgment:  Good  Impulse Control: Good   Risk Assessment: Danger to Self:  No Self-injurious Behavior: No Danger to Others: No Duty to Warn:no Physical Aggression / Violence:No  Access to Firearms a concern: No  Gang Involvement:No   In case of a mental health emergency:  30 - confidential suicide hotline. [facility]        [address], [place] [address]       [phone] 3.   911  4.   Visiting Nearest ED.    Subjective:   Sharon Bryan participated from home, via video, is aware of the limits of tele-health sessions, and consented to treatment. Therapist participated from home office.Sharon Bryan noted the events of the past week. Sharon Bryan noted continued stressors at home. She noted having to euthanize her elderly dog who had a stroke this weekend. She noted her experience this weekend and her feelings regarding their most recent disagreement. She noted a need to leave the home and be with family. Therapist encouraged  Sharon Bryan to seek support from her family and to work on managing her stress. Therapist encouraged Sharon Bryan to take time to identify her own needs and the future of their relationship. Therapist encouraged Sharon Bryan to make choices that are  beneficial for her both short-term and long-term that will support balance and a good mood. A follow-up was scheduled for continued treatment. Therapist validated and normalized Sharon Bryan's feelings and provided supportive therapy. Sharon Bryan was engaged and motivated during the session.   Interventions: Interpersonal & CBT  Diagnosis:  Other depression  Psychiatric Treatment: Yes , via PCP Dr. Sherrie Mustache.   Treatment Plan:  Client Abilities/Strengths Sharon Bryan is self-ware, motivated for change, and flexible.   Support System: Husband and family.   Client Treatment Preferences Outpatient Therapy.   Client Statement of Needs Sussan would like to engage in enjoyable activities, managing her overall mood and symptoms, improving self-talk, resolve relationship stressors, improve communication and process past events.  Treatment Level Weekly  Symptoms  Depression: loss of interest, feeling down, middle insomnia, lethargy, poor appetite, feeling bad about self, difficulty concentrating,  psycho-motor agitation, no SI.     (Status: maintained) Anxiety: Anxious, difficulty managing worry, worrying about different things, restlessness, easily annoyed, feeling afraid as if something bad might happen.    (Status: maintained)  Goals:   Rayma experiences symptoms of depression and anxiety.    Target Date: 01/09/23 Frequency: Weekly  Progress: 0 Modality: individual    Therapist will provide referrals for additional resources as appropriate.  Therapist will provide psycho-education regarding Luise's diagnosis and corresponding treatment approaches and interventions. Licensed Clinical Social Worker, [place], LCSW will support the patient's ability to achieve the goals identified. will employ CBT, BA, Problem-solving, Solution Focused, Mindfulness,  coping skills, & other evidenced-based practices  will be used to promote progress towards healthy functioning to help manage decrease  symptoms associated with her diagnosis.   Reduce overall level, frequency, and intensity of the feelings of depression, anxiety and panic evidenced by decreased overall symptoms from 6 to 7 days/week to 0 to 1 days/week per client report for at least 3 consecutive months. Verbally express understanding of the relationship between feelings of depression, anxiety and their impact on thinking patterns and behaviors. Verbalize an understanding of the role that distorted thinking plays in creating fears, excessive worry, and ruminations.   Sharon Bryan participated in the creation of the treatment plan)   [name], LCSW

## 2022-10-03 ENCOUNTER — Encounter: Payer: Self-pay | Admitting: Family Medicine

## 2022-10-04 ENCOUNTER — Encounter: Payer: Self-pay | Admitting: Endocrinology

## 2022-10-04 ENCOUNTER — Ambulatory Visit: Payer: Medicare HMO | Admitting: Endocrinology

## 2022-10-04 DIAGNOSIS — Z6841 Body Mass Index (BMI) 40.0 and over, adult: Secondary | ICD-10-CM

## 2022-10-04 DIAGNOSIS — E041 Nontoxic single thyroid nodule: Secondary | ICD-10-CM

## 2022-10-04 DIAGNOSIS — R233 Spontaneous ecchymoses: Secondary | ICD-10-CM | POA: Diagnosis not present

## 2022-10-04 DIAGNOSIS — E039 Hypothyroidism, unspecified: Secondary | ICD-10-CM | POA: Diagnosis not present

## 2022-10-04 DIAGNOSIS — R5382 Chronic fatigue, unspecified: Secondary | ICD-10-CM | POA: Diagnosis not present

## 2022-10-04 NOTE — Progress Notes (Signed)
Outpatient Endocrinology Note [place] Ataya Murdy, MD  10/04/22  Patient's Name: Sharon Bryan    DOB: 03-21-1965    MRN: [id number]  REASON OF VISIT: New consult for evaluation of concern of cushing's syndrome  REFERRING PROVIDER:Fisher, Demetrios Isaacs, MD  PCP:  Malva Limes, MD  HISTORY OF PRESENT ILLNESS:   Sharon Bryan is a 57 y.o. old female with past medical history listed below, is here for evaluation of concern of Cushing syndrome.  Pertinent history: Patient is accompanied by her husband, history is provided by patient and her husband.  Patient started to gain weight slowly for about 10 years however in the last 4 to 5 years she had gained significant amount of weight about 130 pound.  In last 1 and half years she has significant symptoms of low energy/fatigue, multiple joint pain and muscle ache, more of generalized weakness.  She is very much exhausted with minimal activities.  With the weight gain has noticed rounding of the face and weight gain is mainly central in the abdomen and also has fat pad in the posterior neck.  She has easy bruising and has multiple bruises. Noted bruise on left lower orbital area, reports happened due to licking of dog. She has complaints of increased sweating.  Recently she also has noticed fine hairs on the upper lip and legs.  She was also told to have fibromyalgia.  She denies flushing of the face.  She used to be physically active in the past, used to walk about 5 miles 3 times a week however in the last couple of years she has not been as active, not able to exercise, mostly sedentary.  She has been trying to eat healthy, approximately eating about 1500 kcal/day with main meal is once a day however she snacks other times, she mainly drinks water and sometimes drink diet sodas.  She had tried keto diet in the past however was only able to lose about 10 pound.  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  She has been taking multiple medications for her mental health, sleep and pain, following with psychiatry and primary care provider. She has been taking duloxetine, paroxetine, pregabalin, buspirone, nortriptyline.  She has been on antiseizure medications phenytoin, Lamictal.  She has been taking Estrace/estradiol 2 mg daily for postmenopausal symptoms.  She has been taking Excedrin containing aspirin couple of times a week, not on anticoagulation.  New medication was started was modafinil due to sleep disorder.  She was recently diagnosed with obstructive sleep apnea and planning to have CPAP soon.  With all the symptoms and features patient asked to have Cushing's workup and following workup were done by primary care provider: - Patient had 1 mg dexamethasone suppression test overnight, patient reports that she had taken dexamethasone around 10 PM prior night and had a blood work next morning 9 AM as follows.  Total cortisol on this test was high probably related to she has been taking estrogen which can increase the corticotropin binding globulin and can increase the total cortisol.    Latest Reference Range & Units 06/27/22 08:59  Cortisol - AM 6.2 - 19.4 ug/dL 6.6   She had 29-BMWU urine cortisol test, 39 was normal.  No 24-hour urine volume is available to review.   Latest Reference Range & Units 08/02/22 08:00  Cortisol (Ur), Free 6 - 42 ug/24 hr 39  Cortisol,F,ug/L,U Undefined ug/L 18  Review of the other labs, she has normal electrolytes including serum sodium, serum potassium.  She has normal renal function.  She had normal vitamin D level in June 2024.  She has normal blood pressure and does not take antihypertensive medication.  # Thyroid nodules -Patient mentioned that she has history of small thyroid nodule, initially evaluated by ENT, ultrasound thyroid in October 2012 had bilateral subcentimeter hypoechoic thyroid nodules measuring 3 to 4 mm.    # She has primary hypothyroidism, currently taking levothyroxine 75 mcg daily.  Thyroid function test was normal in February 2024.  REVIEW OF SYSTEMS:  As per history of present illness.   PAST MEDICAL HISTORY: Past Medical History:  Diagnosis Date   Allergy    Anemia, iron deficiency 09/02/2008   AVM (arteriovenous malformation) brain 07/09/2003   Bell's palsy    Fibromyalgia    (worsening)   Menopause 2015   Migraine    Numbness and tingling of right arm and leg    Sciatica 08/04/2014   Seizure disorder (HCC)    Thyroid cyst 10/27/2010    PAST SURGICAL HISTORY: Past Surgical History:  Procedure Laterality Date   ABDOMINAL HYSTERECTOMY  2004   Dr. Mia Creek. Partial, ovaries remain   CESAREAN SECTION  01/25/1996   COLONOSCOPY WITH PROPOFOL N/A 11/07/2017   Procedure: COLONOSCOPY WITH PROPOFOL;  Surgeon: Toney Reil, MD;  Location: [facility] ENDOSCOPY;  Service: Gastroenterology;  Laterality: N/A;   CRANIOTOMY  01/25/2003   for artetiovenius malformation   TUBAL LIGATION  01/25/1996    ALLERGIES: Allergies  Allergen Reactions   Codeine     increased heart rate ;choking feeling   Latex Other (See Comments)    FAMILY HISTORY:  Family History  Problem Relation Age of Onset   Hypertension Mother    Arthritis Mother    Breast cancer Sister 88   Breast cancer Other 30       neice   Breast cancer Other        mcousin    SOCIAL HISTORY: Social History   Socioeconomic History   Marital status: Divorced    Spouse name: Not on file   Number of children: Not on file   Years of education: Not on file   Highest education level: Not on file  Occupational History   Not on file  Tobacco Use   Smoking status: Former    Current packs/day: 0.00    Types: Cigarettes    Quit date: 01/25/2007    Years since quitting: 15.7   Smokeless tobacco: Never   Tobacco comments:    Smoked for about 15 years, smoked about 1 pack a day.  Vaping Use   Vaping status: Never  Used  Substance and Sexual Activity   Alcohol use: Yes    Comment: occasional use; 1-2 times a year   Drug use: No   Sexual activity: Not on file  Other Topics Concern   Not on file  Social History Narrative   Not on file   Social Determinants of Health   Financial Resource Strain: Low Risk  (04/18/2022)   Overall Financial Resource Strain (CARDIA)    Difficulty of Paying Living Expenses: Not hard at all  Food Insecurity: No Food Insecurity (04/18/2022)   Hunger Vital Sign    Worried About Running Out of Food in the Last Year: Never true    Ran Out of Food in the Last Year: Never true  Transportation Needs: No Transportation Needs (04/18/2022)   PRAPARE - Transportation  Lack of Transportation (Medical): No    Lack of Transportation (Non-Medical): No  Physical Activity: Insufficiently Active (04/18/2022)   Exercise Vital Sign    Days of Exercise per Week: 2 days    Minutes of Exercise per Session: 20 min  Stress: No Stress Concern Present (04/18/2022)   [employer] of Occupational Health - Occupational Stress Questionnaire    Feeling of Stress : Not at all  Social Connections: Moderately Isolated (04/18/2022)   Social Connection and Isolation Panel [NHANES]    Frequency of Communication with Friends and Family: More than three times a week    Frequency of Social Gatherings with Friends and Family: Twice a week    Attends Religious Services: Never    Database administrator or Organizations: No    Attends Engineer, structural: Never    Marital Status: Living with partner    MEDICATIONS:  Current Outpatient Medications  Medication Sig Dispense Refill   ALPRAZolam (XANAX) 1 MG tablet Take 1/2 tablet once or twice a day as needed for anxiety and panic attacks     Aspirin-Acetaminophen-Caffeine (EXCEDRIN PO) Take 1 tablet daily as needed by mouth.     busPIRone (BUSPAR) 30 MG tablet TAKE 1 TABLET BY MOUTH TWICE DAILY 180 tablet 4   Cholecalciferol (VITAMIN D3) 125  MCG (5000 UT) CAPS Take 1 capsule (5,000 Units total) by mouth daily. 30 capsule    DULoxetine (CYMBALTA) 60 MG capsule Take 1 capsule (60 mg total) by mouth daily. 90 capsule 4   estradiol (ESTRACE) 2 MG tablet TAKE 1 TABLET(2 MG) BY MOUTH DAILY 90 tablet 31   fluticasone (FLONASE) 50 MCG/ACT nasal spray Place 2 sprays into both nostrils daily. 16 g 6   lamoTRIgine (LAMICTAL) 100 MG tablet TAKE 1 TABLET BY MOUTH TWICE DAILY 180 tablet 4   levothyroxine (SYNTHROID) 75 MCG tablet TAKE 1 TABLET(75 MCG) BY MOUTH DAILY 90 tablet 4   Magnesium Oxide 420 MG TABS Take 400 mg by mouth daily.     modafinil (PROVIGIL) 200 MG tablet Take 1 tablet (200 mg total) by mouth every morning. 30 tablet 3   Multiple Vitamins-Minerals (CENTRUM SILVER PO) Take by mouth.     nabumetone (RELAFEN) 750 MG tablet TAKE 1 TO 2 TABLETS(750 TO 1500 MG) BY MOUTH DAILY AS NEEDED FOR LEG AND KNEE PAIN 180 tablet 2   neomycin-polymyxin-hydrocortisone (CORTISPORIN) 3.5-10000-1 OTIC suspension SHAKE LIQUID AND INSTILL 3 TO 4 DROPS TO AFFECTED EAR FOUR TIMES DAILY 10 mL 3   nortriptyline (PAMELOR) 10 MG capsule Take 1-4 capsules (10-40 mg total) by mouth at bedtime. 100 capsule 4   omeprazole (PRILOSEC) 20 MG capsule TAKE 1 CAPSULE BY MOUTH EVERY DAY 90 capsule 3   PARoxetine (PAXIL) 30 MG tablet Take 1 tablet (30 mg total) by mouth daily. (Patient taking differently: Take 60 mg by mouth daily.) 90 tablet 4   phenytoin (DILANTIN) 100 MG ER capsule Take 2 capsules (200 mg total) by mouth every morning AND 3 capsules (300 mg total) every evening. 450 capsule 3   pregabalin (LYRICA) 225 MG capsule Take 1 capsule (225 mg total) by mouth 2 (two) times daily. 180 capsule 5   traMADol (ULTRAM) 50 MG tablet Take 1 tablet (50 mg total) by mouth every 8 (eight) hours as needed. 30 tablet 3   dexamethasone (DECADRON) 1 MG tablet Take one tablet at night, 9-10 hours before having blood drawn the next morning (Patient not taking: Reported on  10/04/2022) 1 tablet 0  mometasone (ELOCON) 0.1 % cream Apply twice daily to affected body areas 5 days per week as needed. Avoid applying to face, groin, and axilla (Patient not taking: Reported on 10/04/2022) 45 g 2   XIFAXAN 550 MG TABS tablet TAKE 1 TABLET BY MOUTH THREE TIMES DAILY (Patient not taking: Reported on 10/04/2022) 30 tablet 0   No current facility-administered medications for this visit.    PHYSICAL EXAM: Vitals:   10/04/22 1429  BP: 130/85  Pulse: 97  SpO2: 98%  Weight: 256 lb 3.2 oz (116.2 kg)  Height: 5\' 2"  (1.575 m)   Body mass index is 46.86 kg/m.   General: Well developed, well nourished female in no apparent distress. Appropriate for age.  HEENT: AT/Startup, no external lesions. Hearing intact to the spoken word.  No facial erythema or flushing. Eyes: EOMI. Conjunctiva clear and no icterus.  Left lower orbital ecchymosis. Neck: Trachea midline, neck supple without appreciable thyromegaly or lymphadenopathy and no palpable thyroid nodules.  Posterior neck with cervical fat pad present. Abdomen: Soft, non tender, no striae.  Narrow stretch mark present. Neurologic: Alert, oriented, normal speech, deep tendon biceps reflexes normal,  no gross focal neurological deficit Extremities: No pedal pitting edema, no tremors of outstretched hands Skin: Warm, color good.  Multiple bruises in upper extremities, anterior abdominal wall and left periorbital area. Psychiatric: Does not appear depressed or anxious  PERTINENT HISTORIC LABORATORY AND IMAGING STUDIES:  All pertinent laboratory results were reviewed. Please see HPI also for further details.   ASSESSMENT / PLAN  1. Morbid obesity (HCC)   2. Easy bruising   3. Chronic fatigue   4. Thyroid nodule   5. Primary hypothyroidism    -Putting together symptoms of significant weight gain, arthralgia/myalgia, weakness and having multiple bruises, cervical fat pad are concerning for hypercortisolism.  She does not have typical  sign of abdominal purplish striae.  -We also discussed that the symptoms can be multifactorial including her mental health, seizure disorder, polypharmacy with antidepressant/antianxiety medications and lifestyle with obesity, obstructive sleep apnea etc. -She has not been on anticoagulation except for aspirin couple of times a week, is somewhat concerning for having multiple bruises, unclear etiology at this time. -Discussed about continue to try diet plan and increase physical activity and exercise as tolerated. -She had workup for hypercortisolism had normal 24-hour urine free cortisol 39, however no 24-hour urine volume available to review. -She had overnight 1 mg dexamethasone suppression test, 6.6, which was high most likely related with taking estrogen which can increase the corticotropin binding globulin, this is not the ideal test for Cushing syndrome screening when taking estrogen. -I would like to further workup for hypercortisolism to rule out Cushing syndrome.  Plan: -Will check late-night saliva cortisol test 3 samples of different nights. -I would like to repeat 24-hour urine free cortisol with 24-hour creatinine. -Patient will complete these lab in 1-2 weeks.  -Rest of the management plan after these test results.   # Thyroid nodule : Ultrasound in 2012 showed cystic/hypoechoic thyroid nodules bilaterally measuring 3 to 4 mm. -Repeat ultrasound can be considered in the future.  # Hypothyroidism : Currently taking levothyroxine 75 mcg daily.  Managed by primary care provider.   Diagnoses and all orders for this visit:  Morbid obesity (HCC) -     Salivary Cortisol -     Salivary Cortisol -     Salivary Cortisol -     Salivary Cortisol; Future -     Salivary Cortisol; Future -  Salivary Cortisol; Future -     Cortisol,free,24 hour urine w/creatinine; Future  Easy bruising -     Salivary Cortisol -     Salivary Cortisol -     Salivary Cortisol -     Salivary Cortisol;  Future -     Salivary Cortisol; Future -     Salivary Cortisol; Future -     Cortisol,free,24 hour urine w/creatinine; Future  Chronic fatigue -     Salivary Cortisol -     Salivary Cortisol -     Salivary Cortisol -     Salivary Cortisol; Future -     Salivary Cortisol; Future -     Salivary Cortisol; Future -     Cortisol,free,24 hour urine w/creatinine; Future  Thyroid nodule  Primary hypothyroidism    DISPOSITION Follow up in clinic in 1 month suggested.  All questions answered and patient verbalized understanding of the plan.   [place] Winona Sison, MD [facility] Endocrinology [facility] [address] [place], [address] [place], [place] [address] Phone # [phone]  At least part of this note was generated using voice recognition software. Inadvertent word errors may have occurred, which were not recognized during the proofreading process.

## 2022-10-04 NOTE — Patient Instructions (Addendum)
  24-hr Urine Collection: - Preferably to be done while staying at home and start in the morning.  - Discard the first urine of the morning after you wake up because that was the urine from overnight. DO NOT COLLECT FIRST URINE. Then begin 24-hour urine collection.  Collect your urine every time you pee for next 24 hours which means all day and overnight until next morning.  You collect the last urine next morning, that COMPLETES the collection.    Late night salivary cortisol test : collect saliva at 11 PM for 3 different nights and bring back kit to lab.

## 2022-10-05 ENCOUNTER — Ambulatory Visit: Payer: Self-pay | Admitting: *Deleted

## 2022-10-05 ENCOUNTER — Ambulatory Visit (INDEPENDENT_AMBULATORY_CARE_PROVIDER_SITE_OTHER): Payer: Medicare HMO | Admitting: Psychology

## 2022-10-05 DIAGNOSIS — F3289 Other specified depressive episodes: Secondary | ICD-10-CM | POA: Diagnosis not present

## 2022-10-05 DIAGNOSIS — G4719 Other hypersomnia: Secondary | ICD-10-CM

## 2022-10-05 MED ORDER — MODAFINIL 200 MG PO TABS: 400.0000 mg | ORAL_TABLET | Freq: Every morning | ORAL | 3 refills | Status: DC

## 2022-10-05 NOTE — Telephone Encounter (Signed)
Summary: pharmacy needing medication clarity   Lafonda Mosses with [facility] has called in regards to patient's medication, modafinil (PROVIGIL) 200 MG tablet. Lafonda Mosses stated patient stated Dr Sherrie Mustache increased the dosage to twice a day but pharmacy only has the prescription for once a day. Please advise.  [facility] callback # [phone]        Called pharmacy to clarify medication dose , on hold extended time. Unable to wait to notify pharmacy current dose of modafinil 200 mg daily correct. On 08/19/22 OV notes PCP requesting patient to take prior dose of 100 mg twice daily until complete and on new refill of medication of 200 mg to take daily. Attempted to contact patient to review PCP directions on [phone] no answer. LVMTCB [phone].

## 2022-10-05 NOTE — Telephone Encounter (Signed)
  Chief Complaint: clarification Rx modafinil ( provigil) 200 mg daily. Patient reports she is to take medication twice daily per pharmacy instead of daily.  Symptoms: na Frequency: na Pertinent Negatives: Patient denies na Disposition: [] ED /[] Urgent Care (no appt availability in office) / [] Appointment(In office/virtual)/ []  [facility]/ [] Home Care/ [] Refused Recommended Disposition /[] [facility]/ [x]  Follow-up with PCP Additional Notes:   Attempted to contact patient regarding clarification of medication and dose to take. No answer, LVMTCB. In review of chart. , last OV 08/19/22 PCP doc. To increase modafinil to 200 mg daily. Continue current dose by taking two 100 mg tablets until new Rx filled. NT contacted Lafonda Mosses at [facility] to clarify PCP ordered provigil 200 mg daily. Please advise patient of PCP order.    Reason for Disposition  Pharmacy calling with prescription question and triager answers question  Answer Assessment - Initial Assessment Questions 1. NAME of MEDICINE: "What medicine(s) are you calling about?"     Provigil 200 mg daily. 2. QUESTION: "What is your question?" (e.g., double dose of medicine, side effect)     Clarify dose and how often to take medication  3. PRESCRIBER: "Who prescribed the medicine?" Reason: if prescribed by specialist, call should be referred to that group.     PCP 4. SYMPTOMS: "Do you have any symptoms?" If Yes, ask: "What symptoms are you having?"  "How bad are the symptoms (e.g., mild, moderate, severe)     Na  5. PREGNANCY:  "Is there any chance that you are pregnant?" "When was your last menstrual period?"     na  Protocols used: Medication Question Call-A-AH

## 2022-10-05 NOTE — Progress Notes (Signed)
[facility] Counselor/Therapist Progress Note  Patient ID: Sharon Bryan, MRN: [id number]   Date: 10/05/22  Time Spent: 8:13 pm - 8:59 pm : 46 Minutes  Treatment Type: Individual Therapy.  Reported Symptoms: depression and anxiety.   Mental Status Exam: Appearance:  Casual     Behavior: Appropriate  Motor: Normal  Speech/Language:  Normal Rate  Affect: Congruent  Mood: anxious and dysthymic  Thought process: normal  Thought content:   WNL  Sensory/Perceptual disturbances:   WNL  Orientation: oriented to person, place, time/date, and situation  Attention: Good  Concentration: Good  Memory: WNL  Fund of knowledge:  Good  Insight:   Good  Judgment:  Good  Impulse Control: Good   Risk Assessment: Danger to Self:  No Self-injurious Behavior: No Danger to Others: No Duty to Warn:no Physical Aggression / Violence:No  Access to Firearms a concern: No  Gang Involvement:No   In case of a mental health emergency:  100 - confidential suicide hotline. [facility]):        [address], [place] [address]       [phone] 3.   911  4.   Visiting Nearest ED.    Subjective:   Sharon Bryan participated from home, via video, is aware of the limits of tele-health sessions, and consented to treatment. Therapist participated from home office.Sharon Bryan noted the events of the past week. Sharon Bryan noted a lack of progress in her relationship and noted continued strain. She noted her intent to leave the home but noted feeling sick and needing to recover. We explored this upcoming transition and therapist encouraged Sharon Bryan to identify and address her needs and praised Sharon Bryan for her healthy decision-making. She noted a lack of support of her father in-law and noted a need to not engage going forward. She noted continued efforts to set and maintain boundaries until she is able to move out. Therapist validated and  normalized Sharon Bryan's feelings and experience. Therapist encouraged self-care, seeking support from others, and mindfulness of mood and needs. Sharon Bryan was engaged and motivated and expressed commitment towards goals. Therapist praised Sharon Bryan and provided supportive therapy. A follow-up was scheduled for continued treatment.   Interventions: Interpersonal & CBT  Diagnosis:  Other depression  Psychiatric Treatment: Yes , via PCP Dr. Sherrie Mustache.   Treatment Plan:  Client Abilities/Strengths Sharon Bryan is self-ware, motivated for change, and flexible.   Support System: Husband and family.   Client Treatment Preferences Outpatient Therapy.   Client Statement of Needs Sharon Bryan would like to engage in enjoyable activities, managing her overall mood and symptoms, improving self-talk, resolve relationship stressors, improve communication and process past events.  Treatment Level Weekly  Symptoms  Depression: loss of interest, feeling down, middle insomnia, lethargy, poor appetite, feeling bad about self, difficulty concentrating,  psycho-motor agitation, no SI.     (Status: maintained) Anxiety: Anxious, difficulty managing worry, worrying about different things, restlessness, easily annoyed, feeling afraid as if something bad might happen.    (Status: maintained)  Goals:   Sharon Bryan experiences symptoms of depression and anxiety.    Target Date: 01/09/23 Frequency: Weekly  Progress: 0 Modality: individual    Therapist will provide referrals for additional resources as appropriate.  Therapist will provide psycho-education regarding [name]'s diagnosis and corresponding treatment approaches and interventions. Licensed Clinical Social Worker, [place], LCSW will support the patient's ability to achieve the goals identified. will employ CBT, BA, Problem-solving, Solution Focused, Mindfulness,  coping skills, & other  evidenced-based practices will be used to promote progress towards healthy  functioning to help manage decrease symptoms associated with her diagnosis.   Reduce overall level, frequency, and intensity of the feelings of depression, anxiety and panic evidenced by decreased overall symptoms from 6 to 7 days/week to 0 to 1 days/week per client report for at least 3 consecutive months. Verbally express understanding of the relationship between feelings of depression, anxiety and their impact on thinking patterns and behaviors. Verbalize an understanding of the role that distorted thinking plays in creating fears, excessive worry, and ruminations.   Sharon Bryan participated in the creation of the treatment plan)   [name], LCSW

## 2022-10-08 ENCOUNTER — Other Ambulatory Visit: Payer: Self-pay | Admitting: Family Medicine

## 2022-10-08 NOTE — Telephone Encounter (Signed)
encounter opened in error, closed for administrative reasons.

## 2022-10-12 ENCOUNTER — Encounter: Payer: Medicare HMO | Admitting: Psychology

## 2022-10-12 NOTE — Progress Notes (Deleted)
[name], LCSW

## 2022-10-12 NOTE — Progress Notes (Signed)
This encounter was created in error - please disregard.

## 2022-10-19 ENCOUNTER — Ambulatory Visit (INDEPENDENT_AMBULATORY_CARE_PROVIDER_SITE_OTHER): Payer: Medicare HMO | Admitting: Psychology

## 2022-10-19 DIAGNOSIS — F3289 Other specified depressive episodes: Secondary | ICD-10-CM
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
## 2022-10-26 ENCOUNTER — Ambulatory Visit: Payer: Medicare HMO | Admitting: Psychology

## 2022-10-26 DIAGNOSIS — F3289 Other specified depressive episodes: Secondary | ICD-10-CM | POA: Diagnosis not present

## 2022-10-26 NOTE — Progress Notes (Signed)
[facility] Counselor/Therapist Progress Note  Patient ID: LOREENA VALERI, MRN: [id number]   Date: 10/26/22  Time Spent: 8:03 am - 8:55 am : 52 Minutes  Treatment Type: Individual Therapy.  Reported Symptoms: depression and anxiety.   Mental Status Exam: Appearance:  Casual     Behavior: Appropriate  Motor: Normal  Speech/Language:  Normal Rate  Affect: Congruent  Mood: anxious and dysthymic  Thought process: normal  Thought content:   WNL  Sensory/Perceptual disturbances:   WNL  Orientation: oriented to person, place, time/date, and situation  Attention: Good  Concentration: Good  Memory: WNL  Fund of knowledge:  Good  Insight:   Good  Judgment:  Good  Impulse Control: Good   Risk Assessment: Danger to Self:  No Self-injurious Behavior: No Danger to Others: No Duty to Warn:no Physical Aggression / Violence:No  Access to Firearms a concern: No  Gang Involvement:No   In case of a mental health emergency:  41 - confidential suicide hotline. [facility]):        [address], [place] [address]       [phone] 3.   911  4.   Visiting Nearest ED.    Subjective:   [name] participated from home, via video, is aware of the limits of tele-health sessions, and consented to treatment. Therapist participated from office.[name] noted the events of the past week. [name] noted her significant other being diagnosed with a cyst on his brain but that this is not the reason for his mood fluctuations. She noted her partner receiving this news poorly and being anxious and noted often displacing his frustrations towards her. We explored this during the session and ways to be assertive and set boundaries. Therapist encouraged [name] to be direct and explicit going forward. Therapist modeled this during the session and discussed the importance of being explicit due to her partner's autism diagnosis.  [name] was engaged and motivated during the session. She expressed commitment towards goals. Therapist praised [name] for her effort and energy and provided supportive therapy.  A follow-up was scheduled for continued treatment.   Diagnosis:  Other depression  Psychiatric Treatment: Yes , via PCP Dr. Sherrie Mustache.   Treatment Plan:  Client Abilities/Strengths Shereka is self-ware, motivated for change, and flexible.   Support System: Husband and family.   Client Treatment Preferences Outpatient Therapy.   Client Statement of Needs Caliyah would like to engage in enjoyable activities, managing her overall mood and symptoms, improving self-talk, resolve relationship stressors, improve communication and process past events.  Treatment Level Weekly  Symptoms  Depression: loss of interest, feeling down, middle insomnia, lethargy, poor appetite, feeling bad about self, difficulty concentrating,  psycho-motor agitation, no SI.     (Status: maintained) Anxiety: Anxious, difficulty managing worry, worrying about different things, restlessness, easily annoyed, feeling afraid as if something bad might happen.    (Status: maintained)  Goals:   [name] experiences symptoms of depression and anxiety.    Target Date: 01/09/23 Frequency: Weekly  Progress: 0 Modality: individual    Therapist will provide referrals for additional resources as appropriate.  Therapist will provide psycho-education regarding Heydy's diagnosis and corresponding treatment approaches and interventions. Licensed Clinical Social Worker, [place], LCSW will support the patient's ability to achieve the goals identified. will employ CBT, BA, Problem-solving, Solution Focused, Mindfulness,  coping skills, & other evidenced-based practices will be used to promote progress towards healthy functioning to help manage decrease symptoms associated with  her diagnosis.   Reduce overall level, frequency, and intensity of the  feelings of depression, anxiety and panic evidenced by decreased overall symptoms from 6 to 7 days/week to 0 to 1 days/week per client report for at least 3 consecutive months. Verbally express understanding of the relationship between feelings of depression, anxiety and their impact on thinking patterns and behaviors. Verbalize an understanding of the role that distorted thinking plays in creating fears, excessive worry, and ruminations.   [name] participated in the creation of the treatment plan)   [name], LCSW

## 2022-10-28 ENCOUNTER — Telehealth (INDEPENDENT_AMBULATORY_CARE_PROVIDER_SITE_OTHER): Payer: Medicare HMO | Admitting: Family Medicine

## 2022-10-28 DIAGNOSIS — Z91199 Patient's noncompliance with other medical treatment and regimen due to unspecified reason: Secondary | ICD-10-CM

## 2022-10-28 NOTE — Progress Notes (Signed)
No show

## 2022-10-28 NOTE — Progress Notes (Deleted)
No show

## 2022-11-09 ENCOUNTER — Ambulatory Visit: Payer: Medicare HMO | Admitting: Endocrinology

## 2022-11-09 ENCOUNTER — Ambulatory Visit (INDEPENDENT_AMBULATORY_CARE_PROVIDER_SITE_OTHER): Payer: Medicare HMO | Admitting: Psychology

## 2022-11-09 DIAGNOSIS — F3289 Other specified depressive episodes: Secondary | ICD-10-CM

## 2022-11-09 NOTE — Progress Notes (Signed)
[facility] Counselor/Therapist Progress Note  Patient ID: Sharon Bryan, MRN: [id number]   Date: 11/09/22  Time Spent: 1:07 pm - 1:56 pm : 49 Minutes  Treatment Type: Individual Therapy.  Reported Symptoms: depression and anxiety.   Mental Status Exam: Appearance:  Casual     Behavior: Appropriate  Motor: Normal  Speech/Language:  Normal Rate  Affect: Congruent  Mood: anxious  Thought process: normal  Thought content:   WNL  Sensory/Perceptual disturbances:   WNL  Orientation: oriented to person, place, time/date, and situation  Attention: Good  Concentration: Good  Memory: WNL  Fund of knowledge:  Good  Insight:   Good  Judgment:  Good  Impulse Control: Good   Risk Assessment: Danger to Self:  No Self-injurious Behavior: No Danger to Others: No Duty to Warn:no Physical Aggression / Violence:No  Access to Firearms a concern: No  Gang Involvement:No   In case of a mental health emergency:  30 - confidential suicide hotline. [facility]):        [address], [place] [address]       [phone] 3.   911  4.   Visiting Nearest ED.    Subjective:   Sharon Bryan participated from home, via video, is aware of the limits of tele-health sessions, and consented to treatment. Therapist participated from home office.Sharon Bryan noted the events of the past week. Sharon Bryan noted disturbed sleep, specifically nightmares, due to interpersonal stressors related to family, both with her partner and, separately, her son. She noted her attempts to discuss her concerns regarding her son's behavior and noted this conversation not coming to a resolution and not hearing from her son since that point. She continues to go to couples therapist weekly and noted that this has been a positive experience. She noted family members often leaning on her for support and discussed this being overwhelming and possibly having  an effect on her mood. We discussed ways to set boundaries going forward while communicating empathy. She noted a need to manage her rumination and discussed that this has been difficult to manage along with the distress she experiences from recent nightmares. We discussed grounding techniques, managing the self-talk, and setting boundaries with self. Therapist modeled this during the session and resources were provided via email. Shalise denied any safety concerns and therapist reiterated safety. Sharon Bryan was engaged and motivated during the session. She expressed commitment towards goals. Therapist praised Sharon Bryan and provided supportive therapy. A follow-up was scheduled for continued treatment. Sharon Bryan noted her intent to contact her prescriber to discuss a possible medication change.    Interventions: CBT & Relaxation  Diagnosis:  Other depression  Psychiatric Treatment: Yes , via PCP Dr. Sherrie Mustache.   Treatment Plan:  Client Abilities/Strengths Sharon Bryan is self-ware, motivated for change, and flexible.   Support System: Husband and family.   Client Treatment Preferences Outpatient Therapy.   Client Statement of Needs Sharon Bryan would like to engage in enjoyable activities, managing her overall mood and symptoms, improving self-talk, resolve relationship stressors, improve communication and process past events.  Treatment Level Weekly  Symptoms  Depression: loss of interest, feeling down, middle insomnia, lethargy, poor appetite, feeling bad about self, difficulty concentrating,  psycho-motor agitation, no SI.     (Status: maintained) Anxiety: Anxious, difficulty managing worry, worrying about different things, restlessness, easily annoyed, feeling afraid as if something bad might happen.    (Status: maintained)  Goals:   Sharon Bryan experiences symptoms of depression and  anxiety.    Target Date: 01/09/23 Frequency: Weekly  Progress: 0 Modality: individual    Therapist will provide  referrals for additional resources as appropriate.  Therapist will provide psycho-education regarding Sharon Bryan's diagnosis and corresponding treatment approaches and interventions. Licensed Clinical Social Worker, [place], LCSW will support the patient's ability to achieve the goals identified. will employ CBT, BA, Problem-solving, Solution Focused, Mindfulness,  coping skills, & other evidenced-based practices will be used to promote progress towards healthy functioning to help manage decrease symptoms associated with her diagnosis.   Reduce overall level, frequency, and intensity of the feelings of depression, anxiety and panic evidenced by decreased overall symptoms from 6 to 7 days/week to 0 to 1 days/week per client report for at least 3 consecutive months. Verbally express understanding of the relationship between feelings of depression, anxiety and their impact on thinking patterns and behaviors. Verbalize an understanding of the role that distorted thinking plays in creating fears, excessive worry, and ruminations.   Sharon Bryan participated in the creation of the treatment plan)   [name], LCSW

## 2022-11-11 ENCOUNTER — Telehealth (INDEPENDENT_AMBULATORY_CARE_PROVIDER_SITE_OTHER): Payer: Medicare HMO | Admitting: Family Medicine

## 2022-11-11 DIAGNOSIS — G40909 Epilepsy, unspecified, not intractable, without status epilepticus: Secondary | ICD-10-CM

## 2022-11-11 DIAGNOSIS — R5381 Other malaise: Secondary | ICD-10-CM | POA: Diagnosis not present

## 2022-11-11 DIAGNOSIS — G4730 Sleep apnea, unspecified: Secondary | ICD-10-CM | POA: Diagnosis not present

## 2022-11-11 DIAGNOSIS — G4733 Obstructive sleep apnea (adult) (pediatric): Secondary | ICD-10-CM

## 2022-11-11 DIAGNOSIS — F32A Depression, unspecified: Secondary | ICD-10-CM | POA: Diagnosis not present

## 2022-11-11 DIAGNOSIS — F419 Anxiety disorder, unspecified: Secondary | ICD-10-CM | POA: Diagnosis not present

## 2022-11-11 DIAGNOSIS — R5383 Other fatigue: Secondary | ICD-10-CM

## 2022-11-11 DIAGNOSIS — F411 Generalized anxiety disorder: Secondary | ICD-10-CM

## 2022-11-11 MED ORDER — DIAZEPAM 5 MG PO TABS: 5.0000 mg | ORAL_TABLET | Freq: Three times a day (TID) | ORAL | 1 refills | Status: DC | PRN

## 2022-11-17 ENCOUNTER — Ambulatory Visit: Payer: Medicare HMO | Admitting: Psychology

## 2022-11-17 DIAGNOSIS — F3289 Other specified depressive episodes: Secondary | ICD-10-CM | POA: Diagnosis not present
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
## 2022-11-21 ENCOUNTER — Ambulatory Visit: Payer: Medicare HMO | Admitting: Endocrinology

## 2022-11-24 ENCOUNTER — Ambulatory Visit: Payer: Medicare HMO | Admitting: Psychology

## 2022-11-24 DIAGNOSIS — F3289 Other specified depressive episodes: Secondary | ICD-10-CM | POA: Diagnosis not present
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
## 2022-11-28 ENCOUNTER — Encounter: Payer: Self-pay | Admitting: Family Medicine

## 2022-11-28 DIAGNOSIS — G40009 Localization-related (focal) (partial) idiopathic epilepsy and epileptic syndromes with seizures of localized onset, not intractable, without status epilepticus: Secondary | ICD-10-CM

## 2022-11-29 DIAGNOSIS — G4733 Obstructive sleep apnea (adult) (pediatric): Secondary | ICD-10-CM | POA: Insufficient documentation

## 2022-11-29 NOTE — Progress Notes (Signed)
MyChart Video Visit    Virtual Visit via Video Note   This format is felt to be most appropriate for this patient at this time. Physical exam was limited by quality of the video and audio technology used for the visit.   Patient location: home Provider location: [facility]  I discussed the limitations of evaluation and management by telemedicine and the availability of in person appointments. The patient expressed understanding and agreed to proceed.  Patient: Sharon Bryan   DOB: 07-19-1965   57 y.o. Female  MRN: [id number] Visit Date: 11/11/2022  Today's healthcare provider: Mila Merry, MD    Subjective    Discussed the use of AI scribe software for clinical note transcription with the patient, who gave verbal consent to proceed.  History of Present Illness   The patient, with a history of anxiety and depression, has been experiencing a significant increase in anxiety symptoms over the past couple of months. She reports having "really bad days" to the point of considering hospitalization. The patient has been in regular contact with two therapists, a couples therapist and an individual therapist, who have provided some strategies for managing anxiety. However, these strategies have only provided minimal relief. The patient reports spending entire days in bed and crying frequently without any identifiable trigger.  The patient denies any recent changes or stressors that could be contributing to the increase in anxiety symptoms. She has been adherent to her current medication regimen, which includes duloxetine 60mg  daily, modafinil (which she stopped taking three days prior to the consultation to see if it was contributing to her anxiety), Lamictal twice daily, and alprazolam 3-5 times daily as needed. The patient reports that the alprazolam sometimes helps, but often leads to sleepiness and the need for additional doses later in the day.  The patient has been on this regimen for  a significant period, and there is a concern that she may be developing a tolerance to the alprazolam. The patient has expressed a strong desire to feel better and to find a medication regimen that can help manage her anxiety without causing excessive sleepiness or other side effects. She has previously tried Lexapro, but it was discontinued in favor of her current regimen.  The patient has a history of seeing a psychiatrist, but it has been several years since her last psychiatric consultation. She is open to seeing a psychiatrist again, but has expressed a preference for a different provider than the one she saw previously. The patient's partner has been involved in her care and has expressed a desire for any new medications to be reviewed by the current provider to avoid potential interactions with the patient's other medications.       Medications: Outpatient Medications Prior to Visit  Medication Sig   ALPRAZolam (XANAX) 1 MG tablet TAKE 1/2 TO 1 TABLET BY MOUTH EVERY 8 HOURS AS NEEDED   Aspirin-Acetaminophen-Caffeine (EXCEDRIN PO) Take 1 tablet daily as needed by mouth.   busPIRone (BUSPAR) 30 MG tablet TAKE 1 TABLET BY MOUTH TWICE DAILY   Cholecalciferol (VITAMIN D3) 125 MCG (5000 UT) CAPS Take 1 capsule (5,000 Units total) by mouth daily.   dexamethasone (DECADRON) 1 MG tablet Take one tablet at night, 9-10 hours before having blood drawn the next morning   DULoxetine (CYMBALTA) 60 MG capsule Take 1 capsule (60 mg total) by mouth daily.   estradiol (ESTRACE) 2 MG tablet TAKE 1 TABLET(2 MG) BY MOUTH DAILY   fluticasone (FLONASE) 50 MCG/ACT nasal spray  Place 2 sprays into both nostrils daily.   lamoTRIgine (LAMICTAL) 100 MG tablet TAKE 1 TABLET BY MOUTH TWICE DAILY   levothyroxine (SYNTHROID) 75 MCG tablet TAKE 1 TABLET(75 MCG) BY MOUTH DAILY   Magnesium Oxide 420 MG TABS Take 400 mg by mouth daily.   modafinil (PROVIGIL) 200 MG tablet Take 2 tablets (400 mg total) by mouth every morning.    mometasone (ELOCON) 0.1 % cream Apply twice daily to affected body areas 5 days per week as needed. Avoid applying to face, groin, and axilla (Patient not taking: Reported on 10/04/2022)   Multiple Vitamins-Minerals (CENTRUM SILVER PO) Take by mouth.   nabumetone (RELAFEN) 750 MG tablet TAKE 1 TO 2 TABLETS(750 TO 1500 MG) BY MOUTH DAILY AS NEEDED FOR LEG AND KNEE PAIN   neomycin-polymyxin-hydrocortisone (CORTISPORIN) 3.5-10000-1 OTIC suspension SHAKE LIQUID AND INSTILL 3 TO 4 DROPS TO AFFECTED EAR FOUR TIMES DAILY   nortriptyline (PAMELOR) 10 MG capsule Take 1-4 capsules (10-40 mg total) by mouth at bedtime.   omeprazole (PRILOSEC) 20 MG capsule TAKE 1 CAPSULE BY MOUTH EVERY DAY   PARoxetine (PAXIL) 30 MG tablet Take 1 tablet (30 mg total) by mouth daily. (Patient not taking: Reported on 10/28/2022)   phenytoin (DILANTIN) 100 MG ER capsule Take 2 capsules (200 mg total) by mouth every morning AND 3 capsules (300 mg total) every evening.   pregabalin (LYRICA) 225 MG capsule Take 1 capsule (225 mg total) by mouth 2 (two) times daily.   traMADol (ULTRAM) 50 MG tablet Take 1 tablet (50 mg total) by mouth every 8 (eight) hours as needed.   XIFAXAN 550 MG TABS tablet TAKE 1 TABLET BY MOUTH THREE TIMES DAILY (Patient not taking: Reported on 10/04/2022)   No facility-administered medications prior to visit.       Objective    LMP 04/26/2002 (LMP Unknown)     Physical Exam  Awake, alert, oriented x 3. In no apparent distress      Assessment & Plan        Anxiety and Depression Increased anxiety and depressive symptoms with thoughts of self-harm but no active suicidal ideation. Patient has been on Duloxetine 60mg  daily, Lamictal twice daily, Alprazolam 3-5 times daily, and recently stopped Modafinil that was started a few months ago for excessive daytime sleepiness. Discussed the potential contribution of Modafinil to anxiety symptoms and the need for psychiatric evaluation for further  medication management. -Discontinue Alprazolam. -Start Diazepam 5mg  up to three times daily as needed. -Refer to Psychiatry for further evaluation and management.   Sleep Apnea - Awaiting CPAP ordered last month. Modafinil has helped a bit with daytime sleepiness, but may be contributing to increased anxiety symptoms as above and is to keep on hold for the time being.    Medication Interaction Concerns about potential interactions between current medications and newly prescribed Diazepam. Discussed the need for careful monitoring and coordination with all prescribing providers. -Review old records for potential interactions. -Coordinate with Psychiatry regarding any new prescriptions.    No follow-ups on file.     I discussed the assessment and treatment plan with the patient. The patient was provided an opportunity to ask questions and all were answered. The patient agreed with the plan and demonstrated an understanding of the instructions.   The patient was advised to call back or seek an in-person evaluation if the symptoms worsen or if the condition fails to improve as anticipated.  I provided 13 minutes of non-face-to-face time during this encounter.  Mila Merry, MD [facility] [phone] (phone) [phone] (fax)  [facility]

## 2022-11-30 ENCOUNTER — Ambulatory Visit: Payer: Medicare HMO | Admitting: Psychology

## 2022-11-30 ENCOUNTER — Ambulatory Visit: Payer: Self-pay

## 2022-11-30 DIAGNOSIS — F3289 Other specified depressive episodes: Secondary | ICD-10-CM

## 2022-11-30 NOTE — Telephone Encounter (Signed)
Second attempt to reach pt, left VM to call back each attempt.  Routing to practice for PCP's resolution per protocol.

## 2022-11-30 NOTE — Progress Notes (Signed)
[facility] Counselor/Therapist Progress Note  Patient ID: Sharon Bryan, MRN: [id number]   Date: 11/30/22  Time Spent: 8:01 am - 8:54 am : 53 Minutes  Treatment Type: Individual Therapy.  Reported Symptoms: depression and anxiety.   Mental Status Exam: Appearance:  Casual     Behavior: Appropriate  Motor: Normal  Speech/Language:  Normal Rate  Affect: Congruent  Mood: anxious  Thought process: normal  Thought content:   WNL  Sensory/Perceptual disturbances:   WNL  Orientation: oriented to person, place, time/date, and situation  Attention: Good  Concentration: Good  Memory: WNL  Fund of knowledge:  Good  Insight:   Good  Judgment:  Good  Impulse Control: Good   Risk Assessment: Danger to Self:  No Self-injurious Behavior: No Danger to Others: No Duty to Warn:no Physical Aggression / Violence:No  Access to Firearms a concern: No  Gang Involvement:No   In case of a mental health emergency:  96 - confidential suicide hotline. [facility]):        [address], [place] [address]       [phone] 3.   911  4.   Visiting Nearest ED.    Subjective:   Sharon Bryan participated from home, via video, is aware of the limits of tele-health sessions, and consented to treatment. Therapist participated from home office. Sharon Bryan noted the events of the past week. Sharon Bryan  noted having seizures but noted feeling better. She noted increased interpersonal stressors due to the current state of her relationship. She noted not remember a portion of the evening. She discussed poor boundaries by her partner and noted him being verbally antagonistic the following morning. We processed this during the session. She noted communicating with her doctor regarding the occurrence of seizures. She noted a need to discuss these relationship issues in couples counseling. Therapist praised Sharon Bryan for her efforts to  advocate for self. We discussed boundaries and assertive communication. Therapist validated Sharon Bryan's feelings and experience and provided supportive therapy. A Follow-up was scheduled for continued treatment.  Interventions: CBT & interpersonal.   Diagnosis:  Other depression  Psychiatric Treatment: Yes , via PCP Dr. Sherrie Mustache.   Treatment Plan:  Client Abilities/Strengths Sharon Bryan is self-ware, motivated for change, and flexible.   Support System: Husband and family.   Client Treatment Preferences Outpatient Therapy.   Client Statement of Needs Sharon Bryan would like to engage in enjoyable activities, managing her overall mood and symptoms, improving self-talk, resolve relationship stressors, improve communication and process past events.  Treatment Level Weekly  Symptoms  Depression: loss of interest, feeling down, middle insomnia, lethargy, poor appetite, feeling bad about self, difficulty concentrating,  psycho-motor agitation, no SI.     (Status: maintained) Anxiety: Anxious, difficulty managing worry, worrying about different things, restlessness, easily annoyed, feeling afraid as if something bad might happen.    (Status: maintained)  Goals:   Maanasa experiences symptoms of depression and anxiety.    Target Date: 01/09/23 Frequency: Weekly  Progress: 0 Modality: individual    Therapist will provide referrals for additional resources as appropriate.  Therapist will provide psycho-education regarding Sharon Bryan diagnosis and corresponding treatment approaches and interventions. Licensed Clinical Social Worker, [place], LCSW will support the patient's ability to achieve the goals identified. will employ CBT, BA, Problem-solving, Solution Focused, Mindfulness,  coping skills, & other evidenced-based practices will be used to promote progress towards healthy functioning to help manage decrease symptoms associated with her diagnosis.   Reduce  overall level, frequency,  and intensity of the feelings of depression, anxiety and panic evidenced by decreased overall symptoms from 6 to 7 days/week to 0 to 1 days/week per client report for at least 3 consecutive months. Verbally express understanding of the relationship between feelings of depression, anxiety and their impact on thinking patterns and behaviors. Verbalize an understanding of the role that distorted thinking plays in creating fears, excessive worry, and ruminations.   Sharon Bryan participated in the creation of the treatment plan)   [name], LCSW

## 2022-11-30 NOTE — Telephone Encounter (Signed)
Patient called, left VM to return the call to the office to speak to the NT.      Swelling   ----- Message from [place] E sent at 11/30/2022  4:25 PM EST -----  Knot growing underneath her skin in between her ribs at the very top that is increasing in size. Swelling and soreness.   Best contact: [phone]

## 2022-12-01 ENCOUNTER — Ambulatory Visit: Payer: Medicare HMO | Admitting: Endocrinology

## 2022-12-01 ENCOUNTER — Telehealth: Payer: Self-pay | Admitting: Family Medicine

## 2022-12-01 DIAGNOSIS — L089 Local infection of the skin and subcutaneous tissue, unspecified: Secondary | ICD-10-CM

## 2022-12-01 NOTE — Telephone Encounter (Signed)
Patient scheduled.

## 2022-12-02 ENCOUNTER — Ambulatory Visit: Payer: Medicare HMO | Admitting: Family Medicine

## 2022-12-05 ENCOUNTER — Other Ambulatory Visit: Payer: Medicare HMO

## 2022-12-05 ENCOUNTER — Encounter: Payer: Self-pay | Admitting: Family Medicine

## 2022-12-05 ENCOUNTER — Ambulatory Visit (INDEPENDENT_AMBULATORY_CARE_PROVIDER_SITE_OTHER): Payer: Medicare HMO | Admitting: Family Medicine

## 2022-12-05 VITALS — BP 129/73 | Temp 98.9°F | Ht 62.0 in | Wt 253.0 lb

## 2022-12-05 DIAGNOSIS — L723 Sebaceous cyst: Secondary | ICD-10-CM

## 2022-12-05 DIAGNOSIS — R233 Spontaneous ecchymoses: Secondary | ICD-10-CM

## 2022-12-05 DIAGNOSIS — R5382 Chronic fatigue, unspecified: Secondary | ICD-10-CM | POA: Diagnosis not present

## 2022-12-05 DIAGNOSIS — Z23 Encounter for immunization: Secondary | ICD-10-CM | POA: Diagnosis not present

## 2022-12-05 DIAGNOSIS — L089 Local infection of the skin and subcutaneous tissue, unspecified: Secondary | ICD-10-CM

## 2022-12-05 MED ORDER — AMOXICILLIN-POT CLAVULANATE 875-125 MG PO TABS: 1.0000 | ORAL_TABLET | Freq: Two times a day (BID) | ORAL | 0 refills | Status: DC

## 2022-12-05 MED ORDER — AMOXICILLIN-POT CLAVULANATE 875-125 MG PO TABS
1.0000 | ORAL_TABLET | Freq: Two times a day (BID) | ORAL | 0 refills | Status: DC
Start: 2022-12-05 — End: 2022-12-05

## 2022-12-05 NOTE — Progress Notes (Unsigned)
Established patient visit   Patient: Sharon Bryan   DOB: 1965-04-27   57 y.o. Female  MRN: [id number] Visit Date: 12/05/2022  Today's healthcare provider: Mila Merry, MD   Chief Complaint  Patient presents with   Skin Problem   Subjective    Discussed the use of AI scribe software for clinical note transcription with the patient, who gave verbal consent to proceed.  History of Present Illness   The patient presented with a week-old lump between her ribs, which she noticed suddenly. She reported no preceding trauma or injury to the area. The patient described the lump as sore and fluctuating in size, with periods of hardness and sensitivity to touch. She attempted to drain the lump, which resulted in the release of blood and clear fluid, but no thick white discharge. The patient reported no pain associated with the lump, but its size and hardness varied from day to day.  The patient has a history of seizures, which have been controlled with Dilantin. She has previously taken sulfa antibiotics and Augmentin without any known allergic reactions. The patient also mentioned a recent flu shot.       Medications: Outpatient Medications Prior to Visit  Medication Sig   Aspirin-Acetaminophen-Caffeine (EXCEDRIN PO) Take 1 tablet daily as needed by mouth.   busPIRone (BUSPAR) 30 MG tablet TAKE 1 TABLET BY MOUTH TWICE DAILY   Cholecalciferol (VITAMIN D3) 125 MCG (5000 UT) CAPS Take 1 capsule (5,000 Units total) by mouth daily.   diazepam (VALIUM) 5 MG tablet Take 1 tablet (5 mg total) by mouth every 8 (eight) hours as needed for anxiety. TAKE IN PLACE OF ALPRAZOLAM, NOT WITH IT   DULoxetine (CYMBALTA) 60 MG capsule Take 1 capsule (60 mg total) by mouth daily.   estradiol (ESTRACE) 2 MG tablet TAKE 1 TABLET(2 MG) BY MOUTH DAILY   fluticasone (FLONASE) 50 MCG/ACT nasal spray Place 2 sprays into both nostrils daily.   lamoTRIgine (LAMICTAL) 100 MG tablet TAKE 1 TABLET BY MOUTH  TWICE DAILY   levothyroxine (SYNTHROID) 75 MCG tablet TAKE 1 TABLET(75 MCG) BY MOUTH DAILY   Magnesium Oxide 420 MG TABS Take 400 mg by mouth daily.   modafinil (PROVIGIL) 200 MG tablet Take 2 tablets (400 mg total) by mouth every morning.   Multiple Vitamins-Minerals (CENTRUM SILVER PO) Take by mouth.   nabumetone (RELAFEN) 750 MG tablet TAKE 1 TO 2 TABLETS(750 TO 1500 MG) BY MOUTH DAILY AS NEEDED FOR LEG AND KNEE PAIN   neomycin-polymyxin-hydrocortisone (CORTISPORIN) 3.5-10000-1 OTIC suspension SHAKE LIQUID AND INSTILL 3 TO 4 DROPS TO AFFECTED EAR FOUR TIMES DAILY   nortriptyline (PAMELOR) 10 MG capsule Take 1-4 capsules (10-40 mg total) by mouth at bedtime.   omeprazole (PRILOSEC) 20 MG capsule TAKE 1 CAPSULE BY MOUTH EVERY DAY   PARoxetine (PAXIL) 30 MG tablet Take 1 tablet (30 mg total) by mouth daily.   phenytoin (DILANTIN) 100 MG ER capsule Take 2 capsules (200 mg total) by mouth every morning AND 3 capsules (300 mg total) every evening.   pregabalin (LYRICA) 225 MG capsule Take 1 capsule (225 mg total) by mouth 2 (two) times daily.   traMADol (ULTRAM) 50 MG tablet Take 1 tablet (50 mg total) by mouth every 8 (eight) hours as needed.   XIFAXAN 550 MG TABS tablet TAKE 1 TABLET BY MOUTH THREE TIMES DAILY   [DISCONTINUED] ALPRAZolam (XANAX) 1 MG tablet TAKE 1/2 TO 1 TABLET BY MOUTH EVERY 8 HOURS AS NEEDED   [  DISCONTINUED] dexamethasone (DECADRON) 1 MG tablet Take one tablet at night, 9-10 hours before having blood drawn the next morning   [DISCONTINUED] mometasone (ELOCON) 0.1 % cream Apply twice daily to affected body areas 5 days per week as needed. Avoid applying to face, groin, and axilla (Patient not taking: Reported on 10/04/2022)   No facility-administered medications prior to visit.      Objective    BP 129/73 (BP Location: Left Arm, Patient Position: Sitting, Cuff Size: Large)   Temp 98.9 F (37.2 C)   Ht 5\' 2"  (1.575 m)   Wt 253 lb (114.8 kg)   LMP 04/26/2002 (LMP Unknown)    SpO2 98%   BMI 46.27 kg/m    Physical Exam   SKIN: Slightly tender indurated cystic lesion over lower sternum. Slightly erythematous, no discharge.    No results found for any visits on 12/05/22.  Assessment & Plan        Infected Sebaceous Cyst Sudden onset of a painful lump between the ribs, drained by patient with blood and clear fluid. Currently indurated and fluctuating in size. No signs of systemic infection. -Prescribe Augmentin to expedite healing and prevent recurrence of infection. -If no improvement or worsening, consider changing antibiotic to cover MRSA. -Provide paper prescription due to pharmacy computer issues.  Influenza Vaccination  -Administer flu shot today.    No follow-ups on file.      Mila Merry, MD  [facility] [phone] (phone) [phone] (fax)  [facility]

## 2022-12-05 NOTE — Addendum Note (Signed)
Addended by: Bernerd Pho I on: 12/05/2022 04:16 PM   Modules accepted: Orders

## 2022-12-05 NOTE — Addendum Note (Signed)
Addended by: Bernerd Pho I on: 12/05/2022 04:15 PM   Modules accepted: Orders

## 2022-12-09 ENCOUNTER — Ambulatory Visit (INDEPENDENT_AMBULATORY_CARE_PROVIDER_SITE_OTHER): Payer: Medicare HMO | Admitting: Psychology

## 2022-12-09 DIAGNOSIS — F3289 Other specified depressive episodes: Secondary | ICD-10-CM | POA: Diagnosis not present

## 2022-12-09 NOTE — Progress Notes (Signed)
[facility] Counselor/Therapist Progress Note  Patient ID: Sharon Bryan, MRN: [id number]   Date: 12/09/22  Time Spent: 8:03 am - 8:58 am : 55 Minutes  Treatment Type: Individual Therapy.  Reported Symptoms: depression and anxiety.   Mental Status Exam: Appearance:  Casual     Behavior: Appropriate  Motor: Normal  Speech/Language:  Normal Rate  Affect: Congruent  Mood: anxious  Thought process: normal  Thought content:   WNL  Sensory/Perceptual disturbances:   WNL  Orientation: oriented to person, place, time/date, and situation  Attention: Good  Concentration: Good  Memory: WNL  Fund of knowledge:  Good  Insight:   Good  Judgment:  Good  Impulse Control: Good   Risk Assessment: Danger to Self:  No Self-injurious Behavior: No Danger to Others: No Duty to Warn:no Physical Aggression / Violence:No  Access to Firearms a concern: No  Gang Involvement:No   In case of a mental health emergency:  50 - confidential suicide hotline. [facility]iting [facility]):        [address], [place] [address]       [phone] 3.   911  4.   Visiting Nearest ED.    Subjective:   Sharon Bryan participated from home, via video, is aware of the limits of tele-health sessions, and consented to treatment. Therapist participated from home office. Sharon Bryan noted the events of the past week. She discussed the events of the past week including attending couples counseling. She noted continued relationship stressors. She discussed a need for boundaries in the relationship. We explored ways to set boundaries. She noted her relationship counselor will be reaching out to therapist and Sharon Bryan completed a release to facilitate communication. We worked on identifying ways to engage in assertiveness and therapist modeled this during the session. We explored ways to manage her stress, be consistent with boundaries, and focus on self-care.  We processed her feelings and experience and therapist validated during the session. Therapist praised Sharon Bryan for her effort during the session. We reviewed coping skills during the session and therapist encouraged Sharon Bryan to identify additional coping skills to employ during times of stress. Sharon Bryan was engaged and motivated during the session. She expressed commitment towards goals. Therapist praised Sharon Bryan and provided supportive therapy. A Follow-up was scheduled for continued treatment.   Interventions: CBT & interpersonal.   Diagnosis:  Other depression  Psychiatric Treatment: Yes , via PCP Dr. Sherrie Mustache.   Treatment Plan:  Client Abilities/Strengths Maddie is self-ware, motivated for change, and flexible.   Support System: Husband and family.   Client Treatment Preferences Outpatient Therapy.   Client Statement of Needs Sharon Bryan would like to engage in enjoyable activities, managing her overall mood and symptoms, improving self-talk, resolve relationship stressors, improve communication and process past events.  Treatment Level Weekly  Symptoms  Depression: loss of interest, feeling down, middle insomnia, lethargy, poor appetite, feeling bad about self, difficulty concentrating,  psycho-motor agitation, no SI.     (Status: maintained) Anxiety: Anxious, difficulty managing worry, worrying about different things, restlessness, easily annoyed, feeling afraid as if something bad might happen.    (Status: maintained)  Goals:   Sharon Bryan experiences symptoms of depression and anxiety.    Target Date: 01/09/23 Frequency: Weekly  Progress: 0 Modality: individual    Therapist will provide referrals for additional resources as appropriate.  Therapist will provide psycho-education regarding Gola's diagnosis and corresponding treatment approaches and interventions. Licensed Clinical Social Worker, [name], LCSW will support  the patient's ability to achieve the goals  identified. will employ CBT, BA, Problem-solving, Solution Focused, Mindfulness,  coping skills, & other evidenced-based practices will be used to promote progress towards healthy functioning to help manage decrease symptoms associated with her diagnosis.   Reduce overall level, frequency, and intensity of the feelings of depression, anxiety and panic evidenced by decreased overall symptoms from 6 to 7 days/week to 0 to 1 days/week per client report for at least 3 consecutive months. Verbally express understanding of the relationship between feelings of depression, anxiety and their impact on thinking patterns and behaviors. Verbalize an understanding of the role that distorted thinking plays in creating fears, excessive worry, and ruminations.   Sharon Bryan participated in the creation of the treatment plan)   [name], LCSW

## 2022-12-12 LAB — CORTISOL, URINE, 24 HOUR
24 Hour urine volume (VMAHVA): 2500 mL
CREATININE, URINE: 1.5 g/(24.h) (ref 0.50–2.15)
Cortisol (Ur), Free: 18.1 ug/(24.h) (ref 4.0–50.0)

## 2022-12-14 MED ORDER — SULFAMETHOXAZOLE-TRIMETHOPRIM 800-160 MG PO TABS
2.0000 | ORAL_TABLET | Freq: Two times a day (BID) | ORAL | 0 refills | Status: AC
Start: 2022-12-14 — End: 2022-12-24

## 2022-12-16 ENCOUNTER — Ambulatory Visit (INDEPENDENT_AMBULATORY_CARE_PROVIDER_SITE_OTHER): Payer: Medicare HMO | Admitting: Psychology

## 2022-12-16 DIAGNOSIS — F3289 Other specified depressive episodes: Secondary | ICD-10-CM | POA: Diagnosis not present

## 2022-12-16 NOTE — Progress Notes (Signed)
[facility] Counselor/Therapist Progress Note  Patient ID: Sharon Bryan, MRN: [id number]   Date: 12/16/22  Time Spent: 8:05 am - 8:54 am : 49 Minutes  Treatment Type: Individual Therapy.  Reported Symptoms: depression and anxiety.   Mental Status Exam: Appearance:  Casual     Behavior: Appropriate  Motor: Normal  Speech/Language:  Normal Rate  Affect: Congruent  Mood: anxious  Thought process: normal  Thought content:   WNL  Sensory/Perceptual disturbances:   WNL  Orientation: oriented to person, place, time/date, and situation  Attention: Good  Concentration: Good  Memory: WNL  Fund of knowledge:  Good  Insight:   Good  Judgment:  Good  Impulse Control: Good   Risk Assessment: Danger to Self:  No Self-injurious Behavior: No Danger to Others: No Duty to Warn:no Physical Aggression / Violence:No  Access to Firearms a concern: No  Gang Involvement:No   In case of a mental health emergency:  5 - confidential suicide hotline. Visiting [facility] Ur[facility]):        [address], [place] [address]       [phone] 3.   911  4.   Visiting Nearest ED.    Subjective:   Sharon Bryan participated from home, via video, is aware of the limits of tele-health sessions, and consented to treatment. Therapist participated from home office. Sharon Bryan noted the events of the past week. Sharon Bryan noted attending couple's counseling and disclosing additional relationship stressors to her couple's therapy. She noted being at her son's home currently. She discussed a recent disagreement with her partner due to feeling that he as hypocritical and noted that he is more protective of others than her. She noted working on managing her frustration, during that time, but noted a need to discuss this topic during her follow-up couple's session. She noted continuing to advocate for self and communicate assertively. Therapist encouraged  self-care, mindfulness, and focus on managing symptoms. We reviewed coping and boundaries. Sharon Bryan was engaged and motivated during the session. She expressed commitment towards our goals. Therapist praised Sharon Bryan and provided supportive therapy. A follow-up was scheduled for continued treatment.   Interventions: CBT & interpersonal.   Diagnosis:  Other depression  Psychiatric Treatment: Yes , via PCP Dr. Sherrie Mustache.   Treatment Plan:  Client Abilities/Strengths Sharon Bryan is self-ware, motivated for change, and flexible.   Support System: Husband and family.   Client Treatment Preferences Outpatient Therapy.   Client Statement of Needs Sharon Bryan would like to engage in enjoyable activities, managing her overall mood and symptoms, improving self-talk, resolve relationship stressors, improve communication and process past events.  Treatment Level Weekly  Symptoms  Depression: loss of interest, feeling down, middle insomnia, lethargy, poor appetite, feeling bad about self, difficulty concentrating,  psycho-motor agitation, no SI.     (Status: maintained) Anxiety: Anxious, difficulty managing worry, worrying about different things, restlessness, easily annoyed, feeling afraid as if something bad might happen.    (Status: maintained)  Goals:   Sharon Bryan experiences symptoms of depression and anxiety.    Target Date: 01/09/23 Frequency: Weekly  Progress: 0 Modality: individual    Therapist will provide referrals for additional resources as appropriate.  Therapist will provide psycho-education regarding Sharon Bryan's diagnosis and corresponding treatment approaches and interventions. Licensed Clinical Social Worker, Raymond, LCSW will support the patient's ability to achieve the goals identified. will employ CBT, BA, Problem-solving, Solution Focused, Mindfulness,  coping skills, & other evidenced-based practices will be used to promote progress  towards healthy functioning to help  manage decrease symptoms associated with her diagnosis.   Reduce overall level, frequency, and intensity of the feelings of depression, anxiety and panic evidenced by decreased overall symptoms from 6 to 7 days/week to 0 to 1 days/week per client report for at least 3 consecutive months. Verbally express understanding of the relationship between feelings of depression, anxiety and their impact on thinking patterns and behaviors. Verbalize an understanding of the role that distorted thinking plays in creating fears, excessive worry, and ruminations.   Sharon Bryan participated in the creation of the treatment plan)   [name], LCSW

## 2022-12-28 ENCOUNTER — Ambulatory Visit: Payer: Medicare HMO | Admitting: Psychology

## 2022-12-28 DIAGNOSIS — F3289 Other specified depressive episodes: Secondary | ICD-10-CM | POA: Diagnosis not present

## 2022-12-28 NOTE — Progress Notes (Unsigned)
[facility] Counselor/Therapist Progress Note  Patient ID: RUWAYDA BALINGIT, MRN: [id number]   Date: 12/28/22  Time Spent: 8:03 am - 9:00 am : 57 Minutes  Treatment Type: Individual Therapy.  Reported Symptoms: depression and anxiety.   Mental Status Exam: Appearance:  Casual     Behavior: Appropriate  Motor: Normal  Speech/Language:  Normal Rate  Affect: Congruent  Mood: anxious  Thought process: normal  Thought content:   WNL  Sensory/Perceptual disturbances:   WNL  Orientation: oriented to person, place, time/date, and situation  Attention: Good  Concentration: Good  Memory: WNL  Fund of knowledge:  Good  Insight:   Good  Judgment:  Good  Impulse Control: Good   Risk Assessment: Danger to Self:  No Self-injurious Behavior: No Danger to Others: No Duty to Warn:no Physical Aggression / Violence:No  Access to Firearms a concern: No  Gang Involvement:No   In case of a mental health emergency:  72 - confidential suicide hotline. Visiting Behavioral [facility] Urgent Care [facility]):        [address], [place] [address]       [phone] 3.   911  4.   Visiting Nearest ED.    Subjective:   [name] participated from home, via video, is aware of the limits of tele-health sessions, and consented to treatment. Therapist participated from home office. [name] noted the events of the past week. She noted frustration regarding her partner's behavior and discussed that he has not been actively participating in couple's counseling. We explored this during the session. She noted frustration regarding this. [name] noted having to often defend herself in arguments. We explored this during the session. She noted experiencing rumination about the motivations of her partner's behavior. We worked on processing this during the session. We worked on identifying ways to manage this and set boundaries for self. Therapist challenged [name]'s  jumps to conclusions during the session. Therapist modeled this during the session. Therapist introduced worrytime during the session. Therapist reviewed this during the session and a handout was emailed fore reference and review. Therapist modeled this during the session and discussed the benefits of managing rumination. We reviewed assertiveness, fair fighting rules, and positive communication. [name] was engaged and motivated during the session. She expressed commitment towards goals. Therapist praised [name] and provided supportive therapy. A follow-up was scheduled for continued treatment.    Interventions: CBT & interpersonal.   Diagnosis:  Other depression  Psychiatric Treatment: Yes , via PCP Dr. Sherrie Mustache.   Treatment Plan:  Client Abilities/Strengths Lutitia is self-ware, motivated for change, and flexible.   Support System: Husband and family.   Client Treatment Preferences Outpatient Therapy.   Client Statement of Needs Riley would like to engage in enjoyable activities, managing her overall mood and symptoms, improving self-talk, resolve relationship stressors, improve communication and process past events.  Treatment Level Weekly  Symptoms  Depression: loss of interest, feeling down, middle insomnia, lethargy, poor appetite, feeling bad about self, difficulty concentrating,  psycho-motor agitation, no SI.     (Status: maintained) Anxiety: Anxious, difficulty managing worry, worrying about different things, restlessness, easily annoyed, feeling afraid as if something bad might happen.    (Status: maintained)  Goals:   Tashia experiences symptoms of depression and anxiety.    Target Date: 01/09/23 Frequency: Weekly  Progress: 0 Modality: individual    Therapist will provide referrals for additional resources as appropriate.  Therapist will provide psycho-education regarding Lizvet's diagnosis and corresponding treatment approaches  and  interventions. Licensed Clinical Social Worker, Bruni, LCSW will support the patient's ability to achieve the goals identified. will employ CBT, BA, Problem-solving, Solution Focused, Mindfulness,  coping skills, & other evidenced-based practices will be used to promote progress towards healthy functioning to help manage decrease symptoms associated with her diagnosis.   Reduce overall level, frequency, and intensity of the feelings of depression, anxiety and panic evidenced by decreased overall symptoms from 6 to 7 days/week to 0 to 1 days/week per client report for at least 3 consecutive months. Verbally express understanding of the relationship between feelings of depression, anxiety and their impact on thinking patterns and behaviors. Verbalize an understanding of the role that distorted thinking plays in creating fears, excessive worry, and ruminations.   [name] participated in the creation of the treatment plan)   [name], LCSW

## 2023-01-03 ENCOUNTER — Other Ambulatory Visit: Payer: Self-pay | Admitting: Family Medicine

## 2023-01-03 DIAGNOSIS — F3289 Other specified depressive episodes: Secondary | ICD-10-CM

## 2023-01-04 ENCOUNTER — Ambulatory Visit: Payer: Medicare HMO | Admitting: Psychology

## 2023-01-04 DIAGNOSIS — F3289 Other specified depressive episodes: Secondary | ICD-10-CM

## 2023-01-04 NOTE — Telephone Encounter (Signed)
Requested Prescriptions  Pending Prescriptions Disp Refills   DULoxetine (CYMBALTA) 60 MG capsule [Pharmacy Med Name: DULOXETINE DR 60MG  CAPSULES] 90 capsule 4    Sig: TAKE 1 CAPSULE(60 MG) BY MOUTH DAILY     Psychiatry: Antidepressants - SNRI - duloxetine Passed - 01/03/2023 10:06 AM      Passed - Cr in normal range and within 360 days    Creatinine  Date Value Ref Range Status  09/18/2012 0.87 0.60 - 1.30 mg/dL Final   Creatinine, Ser  Date Value Ref Range Status  06/03/2022 0.72 0.57 - 1.00 mg/dL Final         Passed - eGFR is 30 or above and within 360 days    EGFR (African American)  Date Value Ref Range Status  09/18/2012 >60  Final   GFR calc Af Amer  Date Value Ref Range Status  02/25/2020 117 >59 mL/min/1.73 Final    Comment:    **In accordance with recommendations from the NKF-ASN Task force,**   [facility] is in the process of updating its eGFR calculation to the   2021 CKD-EPI creatinine equation that estimates kidney function   without a race variable.    EGFR (Non-African Amer.)  Date Value Ref Range Status  09/18/2012 >60  Final    Comment:    eGFR values <58mL/min/1.73 m2 may be an indication of chronic kidney disease (CKD). Calculated eGFR is useful in patients with stable renal function. The eGFR calculation will not be reliable in acutely ill patients when serum creatinine is changing rapidly. It is not useful in  patients on dialysis. The eGFR calculation may not be applicable to patients at the low and high extremes of body sizes, pregnant women, and vegetarians.    GFR calc non Af Amer  Date Value Ref Range Status  02/25/2020 101 >59 mL/min/1.73 Final   eGFR  Date Value Ref Range Status  06/03/2022 99 >59 mL/min/1.73 Final         Passed - Completed PHQ-2 or PHQ-9 in the last 360 days      Passed - Last BP in normal range    BP Readings from Last 1 Encounters:  12/05/22 129/73         Passed - Valid encounter within last 6 months     Recent Outpatient Visits           1 month ago Infected sebaceous cyst   [facility] Malva Limes, MD   1 month ago Depressive disorder   [facility] Malva Limes, MD   2 months ago No-show for appointment   [facility] Malva Limes, MD   3 months ago Urinary tract infection without hematuria, site unspecified   [facility] Malva Limes, MD   4 months ago Unexplained weight gain   [facility] Sherrie Mustache, Demetrios Isaacs, MD

## 2023-01-04 NOTE — Progress Notes (Signed)
[facility] Counselor/Therapist Progress Note  Patient ID: Sharon Bryan, MRN: [id number]   Date: 01/04/23  Time Spent: 9:03 am - 9:53 am : 50 Minutes  Treatment Type: Individual Therapy.  Reported Symptoms: depression and anxiety.   Mental Status Exam: Appearance:  Casual     Behavior: Appropriate  Motor: Normal  Speech/Language:  Normal Rate  Affect: Congruent  Mood: anxious  Thought process: normal  Thought content:   WNL  Sensory/Perceptual disturbances:   WNL  Orientation: oriented to person, place, time/date, and situation  Attention: Good  Concentration: Good  Memory: WNL  Fund of knowledge:  Good  Insight:   Good  Judgment:  Good  Impulse Control: Good   Risk Assessment: Danger to Self:  No Self-injurious Behavior: No Danger to Others: No Duty to Warn:no Physical Aggression / Violence:No  Access to Firearms a concern: No  Gang Involvement:No   In case of a mental health emergency:  56 - confidential suicide hotline. [facility]):        [address], [place] [address]       [phone] 3.   911  4.   Visiting Nearest ED.    Subjective:   Sharon Bryan participated from home, via video, is aware of the limits of tele-health sessions, and consented to treatment. Therapist participated from home office. Sharon Bryan noted the events of the past week. Sharon Bryan noted some cessation in the relationship stress. She noted a a lack of trust in the relationship due to her partner's past behavior. She noted wondering if the trust could be rebuilt. She noted the effect of her partner's decision-making on her mood and their relationship. She noted that her partner has applied pressure for Sharon Bryan to change her therapist but that he could not provide a reason for this request. We worked on processing this during the session. We worked on identifying boundaries and Sharon Bryan. We worked  on reviewing conflict resolution. Sharon Bryan noted a need to continue setting boundaries and attending couple's counseling. Therapist praised Sharon Bryan for her effort during the session and between sessions. Therapist validated Sharon Bryan's feelings and experience and provided supportive therapy. A follow-up was scheduled for continued treatment.   Interventions: CBT & interpersonal.   Diagnosis:  Other depression  Psychiatric Treatment: Yes , via PCP Dr. Sherrie Mustache.   Treatment Plan:  Client Abilities/Strengths Sharon Bryan is self-ware, motivated for change, and flexible.   Support System: Husband and family.   Client Treatment Preferences Outpatient Therapy.   Client Statement of Needs Sharon Bryan would like to engage in enjoyable activities, managing her overall mood and symptoms, improving self-talk, resolve relationship stressors, improve communication and process past events.  Treatment Level Weekly  Symptoms  Depression: loss of interest, feeling down, middle insomnia, lethargy, poor appetite, feeling bad about self, difficulty concentrating,  psycho-motor agitation, no SI.     (Status: maintained) Anxiety: Anxious, difficulty managing worry, worrying about different things, restlessness, easily annoyed, feeling afraid as if something bad might happen.    (Status: maintained)  Goals:   Sharon Bryan experiences symptoms of depression and anxiety.    Target Date: 01/09/23 Frequency: Weekly  Progress: 0 Modality: individual    Therapist will provide referrals for additional resources as appropriate.  Therapist will provide psycho-education regarding Sharon Bryan's diagnosis and corresponding treatment approaches and interventions. Licensed Clinical Social Worker, [place], LCSW will support the patient's ability to achieve the goals identified. will employ CBT, BA, Problem-solving, Solution Focused, Mindfulness,  coping skills, & other evidenced-based practices will be used to promote progress  towards healthy functioning to help manage decrease symptoms associated with her diagnosis.   Reduce overall level, frequency, and intensity of the feelings of depression, anxiety and panic evidenced by decreased overall symptoms from 6 to 7 days/week to 0 to 1 days/week per client report for at least 3 consecutive months. Verbally express understanding of the relationship between feelings of depression, anxiety and their impact on thinking patterns and behaviors. Verbalize an understanding of the role that distorted thinking plays in creating fears, excessive worry, and ruminations.   Sharon Bryan participated in the creation of the treatment plan)   Sharon Ovens, LCSW

## 2023-01-05 ENCOUNTER — Other Ambulatory Visit: Payer: Self-pay | Admitting: Family Medicine

## 2023-01-05 DIAGNOSIS — F411 Generalized anxiety disorder: Secondary | ICD-10-CM

## 2023-01-05 NOTE — Telephone Encounter (Signed)
Requested medication (s) are due for refill today: Yes  Requested medication (s) are on the active medication list: Yes  Last refill:  11/11/22  Future visit scheduled: No  Notes to clinic:  Not delegated.    Requested Prescriptions  Pending Prescriptions Disp Refills   diazepam (VALIUM) 5 MG tablet [Pharmacy Med Name: DIAZEPAM 5MG  TABLETS] 60 tablet     Sig: TAKE 1 TABLET BY MOUTH EVERY 8 HOURS AS NEEDED ANXIETY. TAKE IN PLACE OF ALPRAOLAM, NOT WITH IT     Not Delegated - Psychiatry: Anxiolytics/Hypnotics 2 Failed - 01/05/2023  1:26 PM      Failed - This refill cannot be delegated      Failed - Urine Drug Screen completed in last 360 days      Passed - Patient is not pregnant      Passed - Valid encounter within last 6 months    Recent Outpatient Visits           1 month ago Infected sebaceous cyst   [facility] Malva Limes, MD   1 month ago Depressive disorder   [place] [facility] Malva Limes, MD   2 months ago No-show for appointment   [facility] L[facility], [facility]   3 months ago Urinary tract infection without hematuria, site unspecified   [facility] Malva Limes, MD   4 months ago Unexplained weight gain   [facility] Sherrie Mustache, Demetrios Isaacs, MD

## 2023-01-11 ENCOUNTER — Ambulatory Visit: Payer: Medicare HMO | Admitting: Psychology

## 2023-01-11 DIAGNOSIS — F3289 Other specified depressive episodes: Secondary | ICD-10-CM

## 2023-01-11 NOTE — Progress Notes (Signed)
[facility] Counselor/Therapist Progress Note  Patient ID: Sharon Bryan, MRN: [id number]   Date: 01/11/23  Time Spent: 9:03 am - 9:56 am : 53 Minutes  Treatment Type: Individual Therapy.  Reported Symptoms: depression and anxiety.   Mental Status Exam: Appearance:  Casual     Behavior: Appropriate  Motor: Normal  Speech/Language:  Normal Rate  Affect: Congruent  Mood: anxious  Thought process: normal  Thought content:   WNL  Sensory/Perceptual disturbances:   WNL  Orientation: oriented to person, place, time/date, and situation  Attention: Good  Concentration: Good  Memory: WNL  Fund of knowledge:  Good  Insight:   Good  Judgment:  Good  Impulse Control: Good   Risk Assessment: Danger to Self:  No Self-injurious Behavior: No Danger to Others: No Duty to Warn:no Physical Aggression / Violence:No  Access to Firearms a concern: No  Gang Involvement:No   In case of a mental health emergency:  24 - confidential suicide hotline. Visiting [facility]al Health Urgent Care [facility]):        [address], [place] [address]       [phone] 3.   911  4.   Visiting Nearest ED.    Subjective:   [name] participated from home, via video, is aware of the limits of tele-health sessions, and consented to treatment. Therapist participated from home office. [name] invited her daughter in-law to attend the session and provided verbal consent regarding this prior to the start of the session. [name] was at her son's home and noted having an argument with PJ. She reflected on this experience. She noted behavior on social media and a lack of transparency and honesty regarding this, specifically reducing [name]'s ability to view content. She noted being "gas lighted". We processed this and [name] noted that PJ was being antagonistic. [name]'s DIL, Amber,  noted a need for more consistency from [place] in regards to boundaries at home.  Therapist reviewed fair fighting rules with [name] during the session. We explored this during the session. Therapist encouraged [name] to work on communicating concerns in couple's therapy, advocating for self, setting boundaries for time taken by each person in session, and being more assertive and direct. Therapist modeled this during the session. [name] was engaged and motivated during the session. She expressed commitment towards goals. Therapist praised [name] and provided support therapy. Therapist validated [name]'s feelings and experience. A follow-up was scheduled for continued treatment.    Interventions: CBT & interpersonal.   Diagnosis:  Other depression  Psychiatric Treatment: Yes , via PCP Dr. Sherrie Mustache.   Treatment Plan:  Client Abilities/Strengths Jodi-Ann is self-ware, motivated for change, and flexible.   Support System: Husband and family.   Client Treatment Preferences Outpatient Therapy.   Client Statement of Needs Sauda would like to engage in enjoyable activities, managing her overall mood and symptoms, improving self-talk, resolve relationship stressors, improve communication and process past events.  Treatment Level Weekly  Symptoms  Depression: loss of interest, feeling down, middle insomnia, lethargy, poor appetite, feeling bad about self, difficulty concentrating,  psycho-motor agitation, no SI.     (Status: maintained) Anxiety: Anxious, difficulty managing worry, worrying about different things, restlessness, easily annoyed, feeling afraid as if something bad might happen.    (Status: maintained)  Goals:   Jaidon experiences symptoms of depression and anxiety.    Target Date: 02/09/23 Frequency: Weekly  Progress: 0 Modality: individual    Therapist will provide referrals for additional resources as appropriate.  Therapist will provide psycho-education regarding Shantell's diagnosis and corresponding treatment approaches and  interventions. Licensed Clinical Social Worker, [place], LCSW will support the patient's ability to achieve the goals identified. will employ CBT, BA, Problem-solving, Solution Focused, Mindfulness,  coping skills, & other evidenced-based practices will be used to promote progress towards healthy functioning to help manage decrease symptoms associated with her diagnosis.   Reduce overall level, frequency, and intensity of the feelings of depression, anxiety and panic evidenced by decreased overall symptoms from 6 to 7 days/week to 0 to 1 days/week per client report for at least 3 consecutive months. Verbally express understanding of the relationship between feelings of depression, anxiety and their impact on thinking patterns and behaviors. Verbalize an understanding of the role that distorted thinking plays in creating fears, excessive worry, and ruminations.   [name] participated in the creation of the treatment plan)   [name], LCSW

## 2023-01-14 ENCOUNTER — Other Ambulatory Visit: Payer: Self-pay | Admitting: Family Medicine

## 2023-01-14 DIAGNOSIS — F411 Generalized anxiety disorder: Secondary | ICD-10-CM

## 2023-01-16 NOTE — Telephone Encounter (Signed)
Too soon: reordered 03/14/22 #90 4 RF  Requested Prescriptions  Refused Prescriptions Disp Refills   PARoxetine (PAXIL) 30 MG tablet [Pharmacy Med Name: PAROXETINE 30MG  TABLETS] 180 tablet     Sig: TAKE 2 TABLETS(60 MG) BY MOUTH DAILY     Psychiatry:  Antidepressants - SSRI Passed - 01/16/2023  1:06 PM      Passed - Completed PHQ-2 or PHQ-9 in the last 360 days      Passed - Valid encounter within last 6 months    Recent Outpatient Visits           1 month ago Infected sebaceous cyst   [facility] Malva Limes, MD   2 months ago Depressive disorder   [facility] Malva Limes, MD   2 months ago No-show for appointment   [facility] L[facility], [facility]   4 months ago Urinary tract infection without hematuria, site unspecified   [facility]va Limes, MD   5 months ago Unexplained weight gain   [facility] Sherrie Mustache, Demetrios Isaacs, MD

## 2023-01-19 ENCOUNTER — Other Ambulatory Visit: Payer: Self-pay | Admitting: Family Medicine

## 2023-01-19 DIAGNOSIS — F411 Generalized anxiety disorder: Secondary | ICD-10-CM

## 2023-01-19 NOTE — Telephone Encounter (Signed)
Medication Refill -  Most Recent Primary Care Visit:  Provider: Malva Limes  Department: [facility]  Visit Type: OFFICE VISIT  Date: 12/05/2022  Medication: PARoxetine (PAXIL) 30 MG tablet   Has the patient contacted their pharmacy? Yes  (Agent: If yes, when and what did the pharmacy advise?)  Is this the correct pharmacy for this prescription? Yes If no, delete pharmacy and type the correct one.  This is the patient's preferred pharmacy:   [id number] [place] - [address] [place] [address] Phone: [phone] Fax: [phone]   Has the prescription been filled recently? Yes  Is the patient out of the medication? Yes  Has the patient been seen for an appointment in the last year OR does the patient have an upcoming appointment? Yes  Can we respond through MyChart? Yes  Agent: Please be advised that Rx refills may take up to 3 business days. We ask that you follow-up with your pharmacy.

## 2023-01-23 NOTE — Telephone Encounter (Signed)
Please advise 

## 2023-01-24 ENCOUNTER — Ambulatory Visit: Payer: Medicare HMO | Admitting: Endocrinology

## 2023-01-24 NOTE — Telephone Encounter (Signed)
 Requested Prescriptions  Refused Prescriptions Disp Refills   PARoxetine  (PAXIL ) 30 MG tablet 90 tablet 4    Sig: Take 1 tablet (30 mg total) by mouth daily.     Psychiatry:  Antidepressants - SSRI Passed - 01/24/2023  7:56 AM      Passed - Completed PHQ-2 or PHQ-9 in the last 360 days      Passed - Valid encounter within last 6 months    Recent Outpatient Visits           1 month ago Infected sebaceous cyst   [place] [facility] Gasper Nancyann BRAVO, MD   2 months ago Depressive disorder   [facility] Gasper Nancyann BRAVO, MD   2 months ago No-show for appointment   [facility]per Nancyann BRAVO, MD   4 months ago Urinary tract infection without hematuria, site unspecified   [facility] Gasper Nancyann BRAVO, MD   5 months ago Unexplained weight gain   [facility] Gasper Nancyann BRAVO, MD

## 2023-01-27 ENCOUNTER — Ambulatory Visit (INDEPENDENT_AMBULATORY_CARE_PROVIDER_SITE_OTHER): Payer: Medicare HMO | Admitting: Psychology

## 2023-01-27 DIAGNOSIS — F3289 Other specified depressive episodes: Secondary | ICD-10-CM | POA: Diagnosis not present

## 2023-01-27 NOTE — Progress Notes (Signed)
 [facility] Counselor/Therapist Progress Note  Patient ID: Sharon Bryan, MRN: [id number]   Date: 01/27/23  Time Spent: 12:05 pm -12:51 pm :46 Minutes  Treatment Type: Individual Therapy.  Reported Symptoms: depression and anxiety.   Mental Status Exam: Appearance:  Casual     Behavior: Appropriate  Motor: Normal  Speech/Language:  Normal Rate  Affect: Congruent  Mood: anxious  Thought process: normal  Thought content:   WNL  Sensory/Perceptual disturbances:   WNL  Orientation: oriented to person, place, time/date, and situation  Attention: Good  Concentration: Good  Memory: WNL  Fund of knowledge:  Good  Insight:   Good  Judgment:  Good  Impulse Control: Good   Risk Assessment: Danger to Self:  No Self-injurious Behavior: No Danger to Others: No Duty to Warn:no Physical Aggression / Violence:No  Access to Firearms a concern: No  Gang Involvement:No   In case of a mental health emergency:  98 - confidential suicide hotline. Visiting Behavioral Health Urgent Care [facility]):        [address], [place] [address]       [phone] 3.   911  4.   Visiting Nearest ED.    Subjective:   Sharon Bryan participated from home, via video, is aware of the limits of tele-health sessions, and consented to treatment. Therapist participated from home office.She noted family stress due to her young grand-daughter's unexpected engagement and marriage, in-short order, after her promise not to get married prior to graduation. She noted this resulting her her son and his wife experiencing strain. She noted worry that this might lead to a divorce. She noted feeling physically ill and noted a lack of support from her partner. She noted these various interactions negatively affecting Christmas and her overall level of excitement. We worked on exploring this during the session and the effect of this on her mood and outlook. We worked on  identifying areas of control and lack of control.  Mistee was engaged and motivated during the session.  Therapist praised [name] for her effort and energy during the session.  Therapist encouraged self-care, continued use of tools for positive interactions with her partner, and identifying positives in day-to-day life.  [name] was engaged and motivated during the session and expressed commitment towards her goals.  Therapist praised [name] for her effort and energy provide supportive therapy.  Follow-up was scheduled for continued treatment which [name] benefits from.  Interventions: CBT & interpersonal.   Diagnosis:  Other depression  Psychiatric Treatment: Yes , via PCP Dr. Gasper.   Treatment Plan:  Client Abilities/Strengths [name] is self-ware, motivated for change, and flexible.   Support System: Husband and family.   Client Treatment Preferences Outpatient Therapy.   Client Statement of Needs Autry would like to engage in enjoyable activities, managing her overall mood and symptoms, improving self-talk, resolve relationship stressors, improve communication and process past events.  Treatment Level Weekly  Symptoms  Depression: loss of interest, feeling down, middle insomnia, lethargy, poor appetite, feeling bad about self, difficulty concentrating,  psycho-motor agitation, no SI.     (Status: maintained) Anxiety: Anxious, difficulty managing worry, worrying about different things, restlessness, easily annoyed, feeling afraid as if something bad might happen.    (Status: maintained)  Goals:   [name] experiences symptoms of depression and anxiety.    Target Date: 02/09/23 Frequency: Weekly  Progress: 0 Modality: individual    Therapist will provide referrals for additional resources as appropriate.  Therapist will provide  psycho-education regarding [name]'s diagnosis and corresponding treatment approaches and interventions. Licensed Clinical Social  Worker, [place], LCSW will support the patient's ability to achieve the goals identified. will employ CBT, BA, Problem-solving, Solution Focused, Mindfulness,  coping skills, & other evidenced-based practices will be used to promote progress towards healthy functioning to help manage decrease symptoms associated with her diagnosis.   Reduce overall level, frequency, and intensity of the feelings of depression, anxiety and panic evidenced by decreased overall symptoms from 6 to 7 days/week to 0 to 1 days/week per client report for at least 3 consecutive months. Verbally express understanding of the relationship between feelings of depression, anxiety and their impact on thinking patterns and behaviors. Verbalize an understanding of the role that distorted thinking plays in creating fears, excessive worry, and ruminations.   [name] participated in the creation of the treatment plan)   Elvie Mullet, LCSW

## 2023-01-30 ENCOUNTER — Telehealth: Payer: Medicare HMO | Admitting: Family Medicine

## 2023-01-31 ENCOUNTER — Telehealth (INDEPENDENT_AMBULATORY_CARE_PROVIDER_SITE_OTHER): Payer: Medicare HMO | Admitting: Family Medicine

## 2023-01-31 DIAGNOSIS — R0789 Other chest pain: Secondary | ICD-10-CM | POA: Diagnosis not present

## 2023-01-31 DIAGNOSIS — F411 Generalized anxiety disorder: Secondary | ICD-10-CM | POA: Diagnosis not present

## 2023-01-31 MED ORDER — DIAZEPAM 5 MG PO TABS: 5.0000 mg | ORAL_TABLET | Freq: Three times a day (TID) | ORAL | 2 refills | Status: DC | PRN

## 2023-02-08 ENCOUNTER — Other Ambulatory Visit: Payer: Self-pay | Admitting: Family Medicine

## 2023-02-08 ENCOUNTER — Ambulatory Visit: Payer: Medicare HMO | Admitting: Psychology

## 2023-02-08 DIAGNOSIS — F3289 Other specified depressive episodes: Secondary | ICD-10-CM | POA: Diagnosis not present

## 2023-02-08 NOTE — Progress Notes (Signed)
 [facility] Counselor/Therapist Progress Note  Patient ID: Sharon Bryan, MRN: [id number]   Date: 02/08/23  Time Spent: 8:02 am - 8:54 pm :  52 Minutes  Treatment Type: Individual Therapy.  Reported Symptoms: depression and anxiety.   Mental Status Exam: Appearance:  Casual     Behavior: Appropriate  Motor: Normal  Speech/Language:  Normal Rate  Affect: Congruent  Mood: anxious  Thought process: normal  Thought content:   WNL  Sensory/Perceptual disturbances:   WNL  Orientation: oriented to person, place, time/date, and situation  Attention: Good  Concentration: Good  Memory: WNL  Fund of knowledge:  Good  Insight:   Good  Judgment:  Good  Impulse Control: Good   Risk Assessment: Danger to Self:  No Self-injurious Behavior: No Danger to Others: No Duty to Warn:no Physical Aggression / Violence:No  Access to Firearms a concern: No  Gang Involvement:No   In case of a mental health emergency:  40 - confidential suicide hotline. Visiting [facility] Urgent Care [facility]):        [address], [place] [address]       [phone] 3.   911  4.   Visiting Nearest ED.    Subjective:   Sharon Bryan participated from home, via video, is aware of the limits of tele-health sessions, and consented to treatment. Therapist participated from home office. Sharon Bryan noted working on journaling during the week and writing down her frustrations. Therapist praised Sharon Bryan for her effort to identifying feelings. She noted recently attending her partner's neurology appointment and this going poorly. She noted her partner leaving the appointment abruptly and being quite upset. We processed this and her response to this. We worked on exploring ways to provide empathy while not enabling poor behavior. We worked on identifying ways to be more assertive and direct in various settings with her partner. Therapist modeled this during the session.  Sharon Bryan noted working on boundary setting and provided examples of this. Therapist praised Sharon Bryan for her effort in this area. Therapist encouraged mindfulness, boundaries, and self-care. Sharon Bryan was engaged and motivated during the session. She expressed commitment towards goals. Therapist provided supportive feedback. A follow-up was scheduled for continued treatment.   Bryan: CBT & interpersonal.   Bryan:  Other depression  Psychiatric Treatment: Yes , via PCP Dr. Shann Darnel.   Treatment Plan:  Client Abilities/Strengths Sharon Bryan is self-ware, motivated for change, and flexible.   Support System: Husband and family.   Client Treatment Preferences Outpatient Therapy.   Client Statement of Needs Sharon Bryan would like to engage in enjoyable activities, managing her overall mood and symptoms, improving self-talk, resolve relationship stressors, improve communication and process past events.  Treatment Level Weekly  Symptoms  Depression: loss of interest, feeling down, middle insomnia, lethargy, poor appetite, feeling bad about self, difficulty concentrating,  psycho-motor agitation, no SI.     (Status: maintained) Anxiety: Anxious, difficulty managing worry, worrying about different things, restlessness, easily annoyed, feeling afraid as if something bad might happen.    (Status: maintained)  Goals:   Sharon Bryan experiences symptoms of depression and anxiety.    Target Date: 02/09/23 Frequency: Weekly  Progress: 0 Modality: individual    Therapist will provide referrals for additional resources as appropriate.  Therapist will provide psycho-education regarding Sharon Bryan. Licensed Clinical Social Worker, [place], LCSW will support the patient's ability to achieve the goals identified. will employ CBT, BA, Problem-solving, Solution Focused, Mindfulness,  coping skills, &  other evidenced-based practices  will be used to promote progress towards healthy functioning to help manage decrease symptoms associated with her Bryan.   Reduce overall level, frequency, and intensity of the feelings of depression, anxiety and panic evidenced by decreased overall symptoms from 6 to 7 days/week to 0 to 1 days/week per client report for at least 3 consecutive months. Verbally express understanding of the relationship between feelings of depression, anxiety and their impact on thinking patterns and behaviors. Verbalize an understanding of the role that distorted thinking plays in creating fears, excessive worry, and ruminations.   Sharon Bryan participated in the creation of the treatment plan)   Sharon Boyden, LCSW

## 2023-02-09 NOTE — Telephone Encounter (Signed)
Requested by interface surescripts.  Requested Prescriptions  Pending Prescriptions Disp Refills   levothyroxine (SYNTHROID) 75 MCG tablet [Pharmacy Med Name: LEVOTHYROXINE 0.075MG  ( ) TABS] 90 tablet 4    Sig: TAKE 1 TABLET(75 MCG) BY MOUTH DAILY     Endocrinology:  Hypothyroid Agents Passed - 02/09/2023  8:12 AM      Passed - TSH in normal range and within 360 days    TSH  Date Value Ref Range Status  03/15/2022 3.510 0.450 - 4.500 uIU/mL Final         Passed - Valid encounter within last 12 months    Recent Outpatient Visits           1 week ago Other chest pain   [place] [facility] Malva Limes, MD   2 months ago Infected sebaceous cyst   [facility]imes, MD   3 months ago Depressive disorder   [place] [facility] Mal[facility] Limes, MD   3 months ago No-show for appointment   [facility] Limes, MD   4 months ago Urinary tract infection without hematuria, site unspecified   [facility] Malva Limes, MD               DULoxetine (CYMBALTA) 60 MG capsule [Pharmacy Med Name: DULOXETINE DR 60MG  CAPSULES] 90 capsule 0    Sig: TAKE 1 CAPSULE(60 MG) BY MOUTH DAILY     Psychiatry: Antidepressants - SNRI - duloxetine Passed - 02/09/2023  8:12 AM      Passed - Cr in normal range and within 360 days    Creatinine  Date Value Ref Range Status  09/18/2012 0.87 0.60 - 1.30 mg/dL Final   Creatinine, Ser  Date Value Ref Range Status  06/03/2022 0.72 0.57 - 1.00 mg/dL Final         Passed - eGFR is 30 or above and within 360 days    EGFR (African American)  Date Value Ref Range Status  09/18/2012 >60  Final   GFR calc Af Amer  Date Value Ref Range Status  02/25/2020 117 >59 mL/min/1.73 Final    Comment:    **In accordance with recommendations from the NKF-ASN Task force,**   [facility] is in the process of updating its eGFR  calculation to the   2021 CKD-EPI creatinine equation that estimates kidney function   without a race variable.    EGFR (Non-African Amer.)  Date Value Ref Range Status  09/18/2012 >60  Final    Comment:    eGFR values <55mL/min/1.73 m2 may be an indication of chronic kidney disease (CKD). Calculated eGFR is useful in patients with stable renal function. The eGFR calculation will not be reliable in acutely ill patients when serum creatinine is changing rapidly. It is not useful in  patients on dialysis. The eGFR calculation may not be applicable to patients at the low and high extremes of body sizes, pregnant women, and vegetarians.    GFR calc non Af Amer  Date Value Ref Range Status  02/25/2020 101 >59 mL/min/1.73 Final   eGFR  Date Value Ref Range Status  06/03/2022 99 >59 mL/min/1.73 Final         Passed - Completed PHQ-2 or PHQ-9 in the last 360 days      Passed - Last BP in normal range    BP Readings from Last 1 Encounters:  12/05/22 129/73  Passed - Valid encounter within last 6 months    Recent Outpatient Visits           1 week ago Other chest pain   [place] [facility] Mal[facility] Limes, MD   2 months ago Infected sebaceous cyst   [place] [facility] Mal[facility], MD   3 months ago Depressive disorder   [place] [facility] Malva Limes, MD   3 months ago No-show for appointment   [facility] Malva Limes, MD   4 months ago Urinary tract infection without hematuria, site unspecified   [facility] Malva Limes, MD

## 2023-02-09 NOTE — Telephone Encounter (Signed)
Requested medication (s) are due for refill today: expired medication  Requested medication (s) are on the active medication list: yes   Last refill:   11/05/21 #90 4 refills  Future visit scheduled: no   Notes to clinic:  expired medication. Do you want to refill Rx?     Requested Prescriptions  Pending Prescriptions Disp Refills   levothyroxine (SYNTHROID) 75 MCG tablet [Pharmacy Med Name: LEVOTHYROXINE 0.075MG  ( ) TABS] 90 tablet 4    Sig: TAKE 1 TABLET(75 MCG) BY MOUTH DAILY     Endocrinology:  Hypothyroid Agents Passed - 02/09/2023  8:13 AM      Passed - TSH in normal range and within 360 days    TSH  Date Value Ref Range Status  03/15/2022 3.510 0.450 - 4.500 uIU/mL Final         Passed - Valid encounter within last 12 months    Recent Outpatient Visits           1 week ago Other chest pain   [place] [facility] Malva Limes, MD   2 months ago Infected sebaceous cyst   [facility]va Limes, MD   3 months ago Depressive disorder   [facility] Mal[facility] L[facility], MD   3 months ago No-show for appointment   [facility], MD   4 months ago Urinary tract infection without hematuria, site unspecified   [facility], MD              Signed Prescriptions Disp Refills   DULoxetine (CYMBALTA) 60 MG capsule 90 capsule 0    Sig: TAKE 1 CAPSULE(60 MG) BY MOUTH DAILY     Psychiatry: Antidepressants - SNRI - duloxetine Passed - 02/09/2023  8:13 AM      Passed - Cr in normal range and within 360 days    Creatinine  Date Value Ref Range Status  09/18/2012 0.87 0.60 - 1.30 mg/dL Final   Creatinine, Ser  Date Value Ref Range Status  06/03/2022 0.72 0.57 - 1.00 mg/dL Final         Passed - eGFR is 30 or above and within 360 days    EGFR (African American)  Date Value Ref Range Status  09/18/2012  >60  Final   GFR calc Af Amer  Date Value Ref Range Status  02/25/2020 117 >59 mL/min/1.73 Final    Comment:    **In accordance with recommendations from the NKF-ASN Task force,**   [facility] is in the process of updating its eGFR calculation to the   2021 CKD-EPI creatinine equation that estimates kidney function   without a race variable.    EGFR (Non-African Amer.)  Date Value Ref Range Status  09/18/2012 >60  Final    Comment:    eGFR values <73mL/min/1.73 m2 may be an indication of chronic kidney disease (CKD). Calculated eGFR is useful in patients with stable renal function. The eGFR calculation will not be reliable in acutely ill patients when serum creatinine is changing rapidly. It is not useful in  patients on dialysis. The eGFR calculation may not be applicable to patients at the low and high extremes of body sizes, pregnant women, and vegetarians.    GFR calc non Af Amer  Date Value Ref Range Status  02/25/2020 101 >59 mL/min/1.73 Final   eGFR  Date Value Ref Range Status  06/03/2022 99 >59 mL/min/1.73 Final  Passed - Completed PHQ-2 or PHQ-9 in the last 360 days      Passed - Last BP in normal range    BP Readings from Last 1 Encounters:  12/05/22 129/73         Passed - Valid encounter within last 6 months    Recent Outpatient Visits           1 week ago Other chest pain   [place] [facility] Malva Limes, MD   2 months ago Infected sebaceous cyst   [facility] Malva Limes, MD   3 months ago Depressive disorder   [place] [facility] Mal[facility] Limes, MD   3 months ago No-show for appointment   [facility] Malva Limes, MD   4 months ago Urinary tract infection without hematuria, site unspecified   [facility] Malva Limes, MD

## 2023-02-10 ENCOUNTER — Encounter: Payer: Self-pay | Admitting: Family Medicine

## 2023-02-15 ENCOUNTER — Ambulatory Visit: Payer: Medicare HMO | Admitting: Psychology

## 2023-02-15 DIAGNOSIS — F3289 Other specified depressive episodes: Secondary | ICD-10-CM | POA: Diagnosis not present

## 2023-02-15 NOTE — Progress Notes (Signed)
[name]/Therapist Progress Note  Patient ID: Sharon Bryan, MRN: [id number]   Date: 02/15/23  Time Spent: 8:04 am - 8:58 am : 54 Minutes  Treatment Type: Individual Therapy.  Reported Symptoms: depression and anxiety.   Mental Status Exam: Appearance:  Casual     Behavior: Appropriate  Motor: Normal  Speech/Language:  Normal Rate  Affect: Congruent  Mood: anxious and dysthymic  Thought process: normal  Thought content:   WNL  Sensory/Perceptual disturbances:   WNL  Orientation: oriented to person, place, time/date, and situation  Attention: Good  Concentration: Good  Memory: WNL  Fund of knowledge:  Good  Insight:   Good  Judgment:  Good  Impulse Control: Good   Risk Assessment: Danger to Self:  No Self-injurious Behavior: No Danger to Others: No Duty to Warn:no Physical Aggression / Violence:No  Access to Firearms a concern: No  Gang Involvement:No   In case of a mental health emergency:  82 - confidential suicide hotline. [facility]iting [facility]):        [address], [place] [address]       [phone] 3.   911  4.   Visiting Nearest ED.    Subjective:   Sharon Bryan participated from home, via video, is aware of the limits of tele-health sessions, and consented to treatment. Therapist participated from home office. Sharon Bryan noted the events of the past week. She noted feeling disconnected and numb from her partner due to a lack of empathy and adherence to previous boundaries. We explored this during the session. She noted this not being the typical experience for her. She noted frustration regarding her partner's commitment to do various tasks at home and not following through. We processed this during the session including how this feels for her. Therapist encouraged Sharon Bryan to continue her work to explore this between session and to address concerns in couple's counseling.  Therapist encouraged Sharon Bryan to engage in self-care between sessions and to work on taking breaks from this topic to focus elsewhere. Sharon Bryan was engaged and motivated during the session. She expressed commitment toward treatment goals. Therapist validated and normalized Sharon Bryan's feelings and experience and provided supportive therapy. A follow-up was scheduled for continued treatment with Sharon Bryan benefits from.   Interventions: CBT & interpersonal.   Diagnosis:  Other depression  Psychiatric Treatment: Yes , via PCP Dr. Sherrie Mustache.   Treatment Plan:  Client Abilities/Strengths Sharon Bryan is self-ware, motivated for change, and flexible.   Support System: Husband and family.   Client Treatment Preferences Outpatient Therapy.   Client Statement of Needs Sharon Bryan would like to engage in enjoyable activities, managing her overall mood and symptoms, improving self-talk, resolve relationship stressors, improve communication and process past events.  Treatment Level Weekly  Symptoms  Depression: loss of interest, feeling down, middle insomnia, lethargy, poor appetite, feeling bad about self, difficulty concentrating,  psycho-motor agitation, no SI.     (Status: maintained) Anxiety: Anxious, difficulty managing worry, worrying about different things, restlessness, easily annoyed, feeling afraid as if something bad might happen.    (Status: maintained)  Goals:   Sharon Bryan experiences symptoms of depression and anxiety.    Target Date: 03/12/23 Frequency: Weekly  Progress: 0 Modality: individual    Therapist will provide referrals for additional resources as appropriate.  Therapist will provide psycho-education regarding Sharon Bryan's diagnosis and corresponding treatment approaches and interventions. Licensed Clinical Social Worker, [name], LCSW will support the patient's ability to achieve the goals identified.  will employ CBT, BA, Problem-solving, Solution Focused, Mindfulness,  coping  skills, & other evidenced-based practices will be used to promote progress towards healthy functioning to help manage decrease symptoms associated with her diagnosis.   Reduce overall level, frequency, and intensity of the feelings of depression, anxiety and panic evidenced by decreased overall symptoms from 6 to 7 days/week to 0 to 1 days/week per client report for at least 3 consecutive months. Verbally express understanding of the relationship between feelings of depression, anxiety and their impact on thinking patterns and behaviors. Verbalize an understanding of the role that distorted thinking plays in creating fears, excessive worry, and ruminations.   Sharon Bryan participated in the creation of the treatment plan)   Sharon Ovens, LCSW

## 2023-02-16 ENCOUNTER — Encounter: Payer: Self-pay | Admitting: Family Medicine

## 2023-02-17 ENCOUNTER — Other Ambulatory Visit: Payer: Medicare HMO

## 2023-02-17 ENCOUNTER — Encounter: Payer: Self-pay | Admitting: Family Medicine

## 2023-02-20 NOTE — Progress Notes (Signed)
MyChart Video Visit    Virtual Visit via Video Note   This format is felt to be most appropriate for this patient at this time. Physical exam was limited by quality of the video and audio technology used for the visit.   Patient location: home Provider location: [facility]  I discussed the limitations of evaluation and management by telemedicine and the availability of in person appointments. The patient expressed understanding and agreed to proceed.  Patient: Sharon Bryan   DOB: 1965-11-09   58 y.o. Female  MRN: [id number] Visit Date: 01/31/2023  Today's healthcare provider: Mila Merry, MD   Chief Complaint  Patient presents with   Anxiety   Chest Pain   Subjective    HPI Patient reports onset chest pain for the past weeks or so. Episode was severe about 1 week ago starting in chest and radiating ton mid back, left arm  and down fingers to that fingertips were numb. Has some shortness of breath. Pain was not related to exertion. No heartburn. Rates pain as 10/10, took one of her nerve pill which did not help at all. Gradually ease up. Has had pain off an on since then, but no episodes as severe as that one.    Medications: Outpatient Medications Prior to Visit  Medication Sig   Aspirin-Acetaminophen-Caffeine (EXCEDRIN PO) Take 1 tablet daily as needed by mouth.   busPIRone (BUSPAR) 30 MG tablet TAKE 1 TABLET BY MOUTH TWICE DAILY   Cholecalciferol (VITAMIN D3) 125 MCG (5000 UT) CAPS Take 1 capsule (5,000 Units total) by mouth daily.   estradiol (ESTRACE) 2 MG tablet TAKE 1 TABLET(2 MG) BY MOUTH DAILY   fluticasone (FLONASE) 50 MCG/ACT nasal spray Place 2 sprays into both nostrils daily.   lamoTRIgine (LAMICTAL) 100 MG tablet TAKE 1 TABLET BY MOUTH TWICE DAILY   Magnesium Oxide 420 MG TABS Take 400 mg by mouth daily.   modafinil (PROVIGIL) 200 MG tablet Take 2 tablets (400 mg total) by mouth every morning.   Multiple Vitamins-Minerals (CENTRUM SILVER PO) Take by mouth.    nabumetone (RELAFEN) 750 MG tablet TAKE 1 TO 2 TABLETS(750 TO 1500 MG) BY MOUTH DAILY AS NEEDED FOR LEG AND KNEE PAIN   neomycin-polymyxin-hydrocortisone (CORTISPORIN) 3.5-10000-1 OTIC suspension SHAKE LIQUID AND INSTILL 3 TO 4 DROPS TO AFFECTED EAR FOUR TIMES DAILY   nortriptyline (PAMELOR) 10 MG capsule Take 1-4 capsules (10-40 mg total) by mouth at bedtime.   omeprazole (PRILOSEC) 20 MG capsule TAKE 1 CAPSULE BY MOUTH EVERY DAY   PARoxetine (PAXIL) 30 MG tablet TAKE 2 TABLETS(60 MG) BY MOUTH DAILY   phenytoin (DILANTIN) 100 MG ER capsule Take 2 capsules (200 mg total) by mouth every morning AND 3 capsules (300 mg total) every evening.   pregabalin (LYRICA) 225 MG capsule Take 1 capsule (225 mg total) by mouth 2 (two) times daily.   traMADol (ULTRAM) 50 MG tablet Take 1 tablet (50 mg total) by mouth every 8 (eight) hours as needed.   XIFAXAN 550 MG TABS tablet TAKE 1 TABLET BY MOUTH THREE TIMES DAILY   [DISCONTINUED] diazepam (VALIUM) 5 MG tablet TAKE 1 TABLET BY MOUTH EVERY 8 HOURS AS NEEDED ANXIETY. TAKE IN PLACE OF ALPRAOLAM, NOT WITH IT   [DISCONTINUED] DULoxetine (CYMBALTA) 60 MG capsule Take 1 capsule (60 mg total) by mouth daily.   [DISCONTINUED] levothyroxine (SYNTHROID) 75 MCG tablet TAKE 1 TABLET(75 MCG) BY MOUTH DAILY   No facility-administered medications prior to visit.     Objective  LMP 04/26/2002 (LMP Unknown)    Physical Exam  Awake, alert, oriented x 3. In no apparent distress.    Assessment & Plan     1. Atypical chest pain (Primary) Not consistent with cardiac angina, but associated with dyspnea. Concerning for vascular pathology including thoracic aneurysm and pulmonary embolism.  - CT Angio Chest W/Cm &/Or Wo Cm; Future  2. . Generalized anxiety disorder Refill  diazepam (VALIUM) 5 MG tablet; Take 1 tablet (5 mg total) by mouth every 8 (eight) hours as needed for anxiety. TAKE IN PLACE OF ALPRAZOLAM, NOT WITH IT  Dispense: 90 tablet; Refill: 2     I  discussed the assessment and treatment plan with the patient. The patient was provided an opportunity to ask questions and all were answered. The patient agreed with the plan and demonstrated an understanding of the instructions.   The patient was advised to call back or seek an in-person evaluation if the symptoms worsen or if the condition fails to improve as anticipated.  I provided 11 minutes of non-face-to-face time during this encounter.   Mila Merry, MD [facility] [phone] (phone) [phone] (fax)  [facility]

## 2023-02-22 ENCOUNTER — Ambulatory Visit (INDEPENDENT_AMBULATORY_CARE_PROVIDER_SITE_OTHER): Payer: Medicare HMO | Admitting: Psychology

## 2023-02-22 DIAGNOSIS — F3289 Other specified depressive episodes: Secondary | ICD-10-CM
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
## 2023-02-23 ENCOUNTER — Telehealth: Payer: Self-pay

## 2023-02-23 ENCOUNTER — Inpatient Hospital Stay: Admission: RE | Admit: 2023-02-23 | Payer: Medicare HMO | Source: Ambulatory Visit

## 2023-02-23 NOTE — Telephone Encounter (Signed)
Copied from CRM [id number]. Topic: General - Other >> Feb 22, 2023 12:22 PM Franchot Heidelberg wrote: Reason for CRM:   Kendal Hymen from [facility] [place] called to report that they are having to cancel the pt's appt for tomorrow because the Prior Auth with Francine Graven is still pending. They will reschedule once the prior auth is completed. Best contact: [phone]

## 2023-02-28 NOTE — Telephone Encounter (Signed)
Do you know what the status of this is. I thought the test was approved.

## 2023-03-01 ENCOUNTER — Ambulatory Visit: Payer: Medicare HMO | Admitting: Psychology

## 2023-03-01 ENCOUNTER — Telehealth: Payer: Self-pay

## 2023-03-01 NOTE — Telephone Encounter (Signed)
 Sharon Bryan,   Was approved with reference # [id number]. Thanks

## 2023-03-01 NOTE — Telephone Encounter (Signed)
 Copied from CRM [id number]. Topic: General - Other >> Mar 01, 2023 10:31 AM Dyane Glance wrote: Reason for A peer to peer has been set up 03/02/23 at 10:15 You will get a call from Dr Armida Lander D at [facility]. She will call work number [phone]

## 2023-03-01 NOTE — Telephone Encounter (Signed)
 They can call my cell at [phone]

## 2023-03-01 NOTE — Telephone Encounter (Signed)
I won't be here and what is peer to peer regarding?

## 2023-03-05 ENCOUNTER — Other Ambulatory Visit: Payer: Self-pay | Admitting: Family Medicine

## 2023-03-05 DIAGNOSIS — R519 Headache, unspecified: Secondary | ICD-10-CM

## 2023-03-07 NOTE — Progress Notes (Deleted)
 Psychiatric Initial Adult Assessment   Patient Identification: Sharon Bryan MRN:  [id number] Date of Evaluation:  03/07/2023 Referral Source: *** Chief Complaint:  No chief complaint on file.  Visit Diagnosis: No diagnosis found.  History of Present Illness:   Sharon Bryan is a 58 y.o. year old female with a history of depression, anxiety, primary hypothyroidism, seizure disorder, obesity, AVM malformation repair s/p carniotomy in 2005 , who presents for follow up appointment for below.   Per chart review, she was seen by endocrinologist for obesity, fatigue. Labs were ordered to rule out cushing syndrome. - "Normal urinary cortisol with a urine volume of 2500 mL, reassuring."    Duloxeitne 60 mg daily, paxil 60 mg daily, Buspirone 30 mg diazepam Lamotrigine 100 mg daily,  Modafinil 400 mg  Nortriptyline 40 mg at night, pregabalin 225 mg   Associated Signs/Symptoms: Depression Symptoms:  {DEPRESSION SYMPTOMS:20000} (Hypo) Manic Symptoms:  {BHH MANIC SYMPTOMS:22872} Anxiety Symptoms:  {BHH ANXIETY SYMPTOMS:22873} Psychotic Symptoms:  {BHH PSYCHOTIC SYMPTOMS:22874} PTSD Symptoms: {BHH PTSD SYMPTOMS:22875}  Past Psychiatric History:  Outpatient:  Psychiatry admission:  Previous suicide attempt:  Past trials of medication:  History of violence:  History of head injury:   Previous Psychotropic Medications: {YES/NO:21197}  Substance Abuse History in the last 12 months:  {yes no:314532}  Consequences of Substance Abuse: {BHH CONSEQUENCES OF SUBSTANCE ABUSE:22880}  Past Medical History:  Past Medical History:  Diagnosis Date   Allergy    Anemia, iron deficiency 09/02/2008   AVM (arteriovenous malformation) brain 07/09/2003   Bell's palsy    Fibromyalgia    (worsening)   Menopause 2015   Migraine    Numbness and tingling of right arm and leg    Sciatica 08/04/2014   Seizure disorder (HCC)    Thyroid cyst 10/27/2010    Past Surgical History:   Procedure Laterality Date   ABDOMINAL HYSTERECTOMY  2004   Dr. Mia Creek. Partial, ovaries remain   CESAREAN SECTION  01/25/1996   COLONOSCOPY WITH PROPOFOL N/A 11/07/2017   Procedure: COLONOSCOPY WITH PROPOFOL;  Surgeon: Toney Reil, MD;  Location: [facility] ENDOSCOPY;  Service: Gastroenterology;  Laterality: N/A;   CRANIOTOMY  01/25/2003   for artetiovenius malformation   TUBAL LIGATION  01/25/1996    Family Psychiatric History: ***  Family History:  Family History  Problem Relation Age of Onset   Hypertension Mother    Arthritis Mother    Breast cancer Sister 55   Breast cancer Other 30       neice   Breast cancer Other        mcousin    Social History:   Social History   Socioeconomic History   Marital status: Divorced    Spouse name: Not on file   Number of children: Not on file   Years of education: Not on file   Highest education level: Not on file  Occupational History   Not on file  Tobacco Use   Smoking status: Former    Current packs/day: 0.00    Types: Cigarettes    Quit date: 01/25/2007    Years since quitting: 16.1   Smokeless tobacco: Never   Tobacco comments:    Smoked for about 15 years, smoked about 1 pack a day.  Vaping Use   Vaping status: Never Used  Substance and Sexual Activity   Alcohol use: Yes    Comment: occasional use; 1-2 times a year   Drug use: No   Sexual activity: Not on file  Other Topics  Concern   Not on file  Social History Narrative   Not on file   Social Drivers of Health   Financial Resource Strain: Low Risk  (04/18/2022)   Overall Financial Resource Strain (CARDIA)    Difficulty of Paying Living Expenses: Not hard at all  Food Insecurity: No Food Insecurity (04/18/2022)   Hunger Vital Sign    Worried About Running Out of Food in the Last Year: Never true    Ran Out of Food in the Last Year: Never true  Transportation Needs: No Transportation Needs (04/18/2022)   PRAPARE - Scientist, research (physical sciences) (Medical): No    Lack of Transportation (Non-Medical): No  Physical Activity: Insufficiently Active (04/18/2022)   Exercise Vital Sign    Days of Exercise per Week: 2 days    Minutes of Exercise per Session: 20 min  Stress: No Stress Concern Present (04/18/2022)   [employer] of Occupational Health - Occupational Stress Questionnaire    Feeling of Stress : Not at all  Social Connections: Moderately Isolated (04/18/2022)   Social Connection and Isolation Panel [NHANES]    Frequency of Communication with Friends and Family: More than three times a week    Frequency of Social Gatherings with Friends and Family: Twice a week    Attends Religious Services: Never    Database administrator or Organizations: No    Attends Banker Meetings: Never    Marital Status: Living with partner    Additional Social History: ***  Allergies:   Allergies  Allergen Reactions   Codeine     increased heart rate ;choking feeling   Latex Other (See Comments)    Metabolic Disorder Labs: No results found for: "HGBA1C", "MPG" No results found for: "PROLACTIN" Lab Results  Component Value Date   CHOL 245 (H) 02/25/2020   TRIG 278 (H) 02/25/2020   HDL 92 02/25/2020   CHOLHDL 2.7 02/25/2020   VLDL 32 09/18/2012   LDLCALC 107 (H) 02/25/2020   LDLCALC 97 09/18/2012   Lab Results  Component Value Date   TSH 3.510 03/15/2022    Therapeutic Level Labs: No results found for: "LITHIUM" No results found for: "CBMZ" No results found for: "VALPROATE"  Current Medications: Current Outpatient Medications  Medication Sig Dispense Refill   Aspirin-Acetaminophen-Caffeine (EXCEDRIN PO) Take 1 tablet daily as needed by mouth.     busPIRone (BUSPAR) 30 MG tablet TAKE 1 TABLET BY MOUTH TWICE DAILY 180 tablet 4   Cholecalciferol (VITAMIN D3) 125 MCG (5000 UT) CAPS Take 1 capsule (5,000 Units total) by mouth daily. 30 capsule    diazepam (VALIUM) 5 MG tablet Take 1 tablet (5 mg  total) by mouth every 8 (eight) hours as needed for anxiety. TAKE IN PLACE OF ALPRAZOLAM, NOT WITH IT 90 tablet 2   DULoxetine (CYMBALTA) 60 MG capsule TAKE 1 CAPSULE(60 MG) BY MOUTH DAILY 90 capsule 0   estradiol (ESTRACE) 2 MG tablet TAKE 1 TABLET(2 MG) BY MOUTH DAILY 90 tablet 31   fluticasone (FLONASE) 50 MCG/ACT nasal spray Place 2 sprays into both nostrils daily. 16 g 6   lamoTRIgine (LAMICTAL) 100 MG tablet TAKE 1 TABLET BY MOUTH TWICE DAILY 180 tablet 4   levothyroxine (SYNTHROID) 75 MCG tablet TAKE 1 TABLET(75 MCG) BY MOUTH DAILY 90 tablet 0   Magnesium Oxide 420 MG TABS Take 400 mg by mouth daily.     modafinil (PROVIGIL) 200 MG tablet Take 2 tablets (400 mg total) by mouth every  morning. 60 tablet 3   Multiple Vitamins-Minerals (CENTRUM SILVER PO) Take by mouth.     nabumetone (RELAFEN) 750 MG tablet TAKE 1 TO 2 TABLETS(750 TO 1500 MG) BY MOUTH DAILY AS NEEDED FOR LEG AND KNEE PAIN 180 tablet 2   neomycin-polymyxin-hydrocortisone (CORTISPORIN) 3.5-10000-1 OTIC suspension SHAKE LIQUID AND INSTILL 3 TO 4 DROPS TO AFFECTED EAR FOUR TIMES DAILY 10 mL 3   nortriptyline (PAMELOR) 10 MG capsule TAKE 1 TO 4 CAPSULES(10 TO 40 MG) BY MOUTH AT BEDTIME 100 capsule 4   omeprazole (PRILOSEC) 20 MG capsule TAKE 1 CAPSULE BY MOUTH EVERY DAY 90 capsule 3   PARoxetine (PAXIL) 30 MG tablet TAKE 2 TABLETS(60 MG) BY MOUTH DAILY 180 tablet 4   phenytoin (DILANTIN) 100 MG ER capsule Take 2 capsules (200 mg total) by mouth every morning AND 3 capsules (300 mg total) every evening. 450 capsule 3   pregabalin (LYRICA) 225 MG capsule Take 1 capsule (225 mg total) by mouth 2 (two) times daily. 180 capsule 5   traMADol (ULTRAM) 50 MG tablet Take 1 tablet (50 mg total) by mouth every 8 (eight) hours as needed. 30 tablet 3   XIFAXAN 550 MG TABS tablet TAKE 1 TABLET BY MOUTH THREE TIMES DAILY 30 tablet 0   No current facility-administered medications for this visit.    Musculoskeletal: Strength & Muscle Tone:  within normal limits Gait & Station: normal Patient leans: N/A  Psychiatric Specialty Exam: Review of Systems  Last menstrual period 04/26/2002.There is no height or weight on file to calculate BMI.  General Appearance: {Appearance:22683}  Eye Contact:  {BHH EYE CONTACT:22684}  Speech:  Clear and Coherent  Volume:  Normal  Mood:  {BHH MOOD:22306}  Affect:  {Affect (PAA):22687}  Thought Process:  Coherent  Orientation:  Full (Time, Place, and Person)  Thought Content:  Logical  Suicidal Thoughts:  {ST/HT (PAA):22692}  Homicidal Thoughts:  {ST/HT (PAA):22692}  Memory:  Immediate;   Good  Judgement:  {Judgement (PAA):22694}  Insight:  {Insight (PAA):22695}  Psychomotor Activity:  Normal  Concentration:  Concentration: Good and Attention Span: Good  Recall:  Good  Fund of Knowledge:Good  Language: Good  Akathisia:  No  Handed:  Right  AIMS (if indicated):  not done  Assets:  Communication Skills Desire for Improvement  ADL's:  Intact  Cognition: WNL  Sleep:  {BHH GOOD/FAIR/POOR:22877}   Screenings: GAD-7    Advertising copywriter from 02/22/2023 in [facility] at [employer] Video Visit from 4/0/1027 in [facility] Video Visit from 08/19/2022 in [facility] Video Visit from 01/28/2022 in [facility] Video Visit from 12/29/2021 in [facility]  Total GAD-7 Score 13 17 7 6 9       PHQ2-9    Flowsheet Row Counselor from 02/22/2023 in [facility] at [employer] Video Visit from 02/28/3662 in [facility] Video Visit from 08/19/2022 in [facility] Video Visit from 07/08/2022 in [facility] Video Visit from 05/20/2022 in [facility]  PHQ-2 Total Score 6 6 5 3 1   PHQ-9 Total Score 16 23 20 10 9       Flowsheet  Row Office Visit from 11/04/2019 in [facility]  C-SSRS RISK CATEGORY Error: Q3, 4, or 5 should not be populated when Q2 is No       Assessment  and Plan:    Plan   The patient demonstrates the following risk factors for suicide: Chronic risk factors for suicide include: {Chronic Risk Factors for ZOXWRUE:45409811}. Acute risk factors for suicide include: {Acute Risk Factors for BJYNWGN:56213086}. Protective factors for this patient include: {Protective Factors for Suicide VHQI:69629528}. Considering these factors, the overall suicide risk at this point appears to be {Desc; low/moderate/high:110033}. Patient {ACTION; IS/IS UXL:24401027} appropriate for outpatient follow up.   Collaboration of Care: {BH OP Collaboration of Care:21014065}  Patient/Guardian was advised Release of Information must be obtained prior to any record release in order to collaborate their care with an outside provider. Patient/Guardian was advised if they have not already done so to contact the registration department to sign all necessary forms in order for [place] to release information regarding their care.   Consent: Patient/Guardian gives verbal consent for treatment and assignment of benefits for services provided during this visit. Patient/Guardian expressed understanding and agreed to proceed.   Neysa Hotter, MD 2/11/202511:25 AM

## 2023-03-08 ENCOUNTER — Ambulatory Visit: Payer: Medicare HMO | Admitting: Psychology

## 2023-03-10 ENCOUNTER — Inpatient Hospital Stay: Admission: RE | Admit: 2023-03-10 | Payer: Medicare HMO | Source: Ambulatory Visit

## 2023-03-13 ENCOUNTER — Ambulatory Visit: Payer: Medicare HMO | Admitting: Psychiatry

## 2023-03-14 ENCOUNTER — Other Ambulatory Visit: Payer: Self-pay | Admitting: Family Medicine

## 2023-03-15 ENCOUNTER — Ambulatory Visit: Payer: Medicare HMO | Admitting: Psychology

## 2023-03-15 ENCOUNTER — Encounter: Payer: Self-pay | Admitting: Psychology

## 2023-03-15 DIAGNOSIS — F3289 Other specified depressive episodes: Secondary | ICD-10-CM | POA: Diagnosis not present

## 2023-03-15 DIAGNOSIS — F32A Depression, unspecified: Secondary | ICD-10-CM | POA: Diagnosis not present

## 2023-03-15 NOTE — Progress Notes (Signed)
[facility] Counselor/Therapist Progress Note  Patient ID: Sharon Bryan, MRN: [id number]   Date: 03/15/23  Time Spent: 8:04  am - 8:51 am : 47 Minutes  Treatment Type: Individual Therapy.  Reported Symptoms: Depression and anxiety.   Mental Status Exam: Appearance:  Casual     Behavior: Appropriate  Motor: Normal  Speech/Language:  Clear and Coherent  Affect: Congruent  Mood: dysthymic  Thought process: normal  Thought content:   WNL  Sensory/Perceptual disturbances:   WNL  Orientation: oriented to person, place, time/date, and situation  Attention: Good  Concentration: Good  Memory: WNL  Fund of knowledge:  Good  Insight:   Good  Judgment:  Good  Impulse Control: Good   Risk Assessment: Danger to Self:  No Self-injurious Behavior: No Danger to Others: No Duty to Warn:no Physical Aggression / Violence:No  Access to Firearms a concern: No  Gang Involvement:No   In case of a mental health emergency:  108 - confidential suicide hotline. [facility]):        [address], [place] [address]       [phone] 3.   911  4.   Visiting Nearest ED.    Subjective:   Sharon Bryan participated from home, via video and consented to treatment. Therapist participated from home office. I discussed the limitations of evaluation and management by telemedicine and the availability of in person appointments. The patient expressed understanding and agreed to proceed. Sharon Bryan reviewed the events of the past week.   We reviewed numerous treatment approaches including CBT, BA, Problem Solving, and Solution focused therapy. Psych-education regarding the Minda's diagnosis of Other depression was provided during the session. We discussed Sharon Bryan's goals treatment goals which include  building autonomy, work on being more expressive, work on being assertive, managing symptoms, processing past  events, boundary setting, managing interpersonal stressors, practice mindfulness, and manage distress day-to-day. Sharon Bryan provided verbal approval of the treatment plan. She endorsed SI on the PHQ-9. We processed this and Sharon Bryan denied any SI, any plans, or self-harming behavior. She noted this being a reflection of her current stressors and frustrations. We reviewed safety, during the session, and Sharon Bryan was agreeable to the plan above.   Interventions: Psycho-education & Goal Setting.   Diagnosis:  Other depression  Psychiatric Treatment: No , pending sign-off.   Treatment Plan:  Client Abilities/Strengths Sharon Bryan is self-aware and motivated for change.   Support System: Family and friends.  Client Treatment Preferences OPT and continued med management.   Client Statement of Needs Sharon Bryan would like to be autonomous, work on being more expressive, work on being assertive, managing symptoms, processing past events, boundary setting, managing interpersonal stressors, practice mindfulness, and manage distress day-to-day.   Treatment Level Weekly  Symptoms  Depression: loss of interest, feeling down, trouble with sleep (insomnia), lethargy, fluctuating appetite (primarily low appetite), feeling bad about self, difficulty concentrating.    (Status: maintained)  Anxiety: Feeling anxious, difficulty managing worry, worrying too much, trouble relaxing, restlessness, irritability, and feeling afraid something awful might happen.    (Status: maintained)  Goals:   Sharon Bryan experiences symptoms of depression and anxiety.   Treatment plan signed and available on s-drive:  No, pending signature.    Target Date: 03/14/2024 Frequency: Weekly  Progress: 0 Modality: individual    Therapist will provide referrals for additional resources as appropriate.  Therapist will provide psycho-education regarding Sharon Bryan's diagnosis and corresponding treatment approaches and  interventions. Sharon Ovens, LCSW will support the patient's ability to achieve the goals identified. will employ CBT, BA, Problem-solving, Solution Focused, Mindfulness,  coping skills, & other evidenced-based practices will be used to promote progress towards healthy functioning to help manage decrease symptoms associated with her diagnosis.   Reduce overall level, frequency, and intensity of the feelings of depression, anxiety and panic evidenced by decreased overall symptoms from 6 to 7 days/week to 0 to 1 days/week per client report for at least 3 consecutive months. Verbally express understanding of the relationship between feelings of depression, anxiety and their impact on thinking patterns and behaviors. Verbalize an understanding of the role that distorted thinking plays in creating fears, excessive worry, and ruminations.    Sharon Bryan participated in the creation of the treatment plan)   Sharon Ovens, LCSW

## 2023-03-16 ENCOUNTER — Inpatient Hospital Stay: Admission: RE | Admit: 2023-03-16 | Payer: Medicare HMO | Source: Ambulatory Visit

## 2023-03-18 ENCOUNTER — Encounter: Payer: Self-pay | Admitting: Family Medicine

## 2023-03-18 DIAGNOSIS — R232 Flushing: Secondary | ICD-10-CM

## 2023-03-20 MED ORDER — ESTRADIOL 2 MG PO TABS: 2.0000 mg | ORAL_TABLET | Freq: Every day | ORAL | 0 refills | Status: DC

## 2023-03-24 ENCOUNTER — Ambulatory Visit: Payer: Medicare HMO | Admitting: Psychology

## 2023-03-24 ENCOUNTER — Ambulatory Visit
Admission: RE | Admit: 2023-03-24 | Discharge: 2023-03-24 | Disposition: A | Discharge status: Home / Self Care | Payer: Medicare HMO | Source: Ambulatory Visit | Attending: Family Medicine | Admitting: Family Medicine

## 2023-03-24 DIAGNOSIS — F3289 Other specified depressive episodes: Secondary | ICD-10-CM

## 2023-03-24 DIAGNOSIS — R0789 Other chest pain: Secondary | ICD-10-CM

## 2023-03-24 MED ORDER — IOPAMIDOL (ISOVUE-370) INJECTION 76%
75.0000 mL | Freq: Once | INTRAVENOUS | Status: AC | PRN
Start: 2023-03-24 — End: 2023-03-24
  Administered 2023-03-24: 75 mL via INTRAVENOUS

## 2023-03-24 NOTE — Progress Notes (Signed)
 [facility] Counselor/Therapist Progress Note  Patient ID: JULISSA BROWNING, MRN: [id number]   Date: 03/24/23  Time Spent: 12:08  pm - 12:52 pm : 44 Minutes  Treatment Type: Individual Therapy.  Reported Symptoms: Depression and anxiety.   Mental Status Exam: Appearance:  Casual     Behavior: Appropriate  Motor: Normal  Speech/Language:  Clear and Coherent  Affect: Congruent  Mood: dysthymic  Thought process: normal  Thought content:   WNL  Sensory/Perceptual disturbances:   WNL  Orientation: oriented to person, place, time/date, and situation  Attention: Good  Concentration: Good  Memory: WNL  Fund of knowledge:  Good  Insight:   Good  Judgment:  Good  Impulse Control: Good   Risk Assessment: Danger to Self:  No Self-injurious Behavior: No Danger to Others: No Duty to Warn:no Physical Aggression / Violence:No  Access to Firearms a concern: No  Gang Involvement:No   In case of a mental health emergency:  84 - confidential suicide hotline. [facility]):        [address], [place] [address]       [phone] 3.   911  4.   Visiting Nearest ED.    Subjective:   [name] participated from home, via video and consented to treatment. Therapist participated from home office.[name] noted frustration regard her partner's general approach and demeanor. She noted her decision to move forward from the relationship. She noted her frustration regarding "having to start all over again". She noted feeling "abandoned" and that this has been her experience throughout life. We worked on exploring this during the session.She noted sadness to have to leave her dogs with her partner. We worked on identifying possible alternatives but [name] noted not being able to identify any alternatives. She noted the financial implications of the separation and move and feeling unsupported and how this limits her  options going forward. . She noted investing in the relationship and not feeling reciprocation. We processed this during the session. Therapist validated [name]'s feelings and experience during the session. Therapist provided supportive therapy.    Interventions: interpersonal.  Diagnosis:  Other depression  Psychiatric Treatment: No , pending sign-off.   Treatment Plan:  Client Abilities/Strengths Eryn is self-aware and motivated for change.   Support System: Family and friends.  Client Treatment Preferences OPT and continued med management.   Client Statement of Needs Anabel would like to be autonomous, work on being more expressive, work on being assertive, managing symptoms, processing past events, boundary setting, managing interpersonal stressors, practice mindfulness, and manage distress day-to-day.   Treatment Level Weekly  Symptoms  Depression: loss of interest, feeling down, trouble with sleep (insomnia), lethargy, fluctuating appetite (primarily low appetite), feeling bad about self, difficulty concentrating.    (Status: maintained)  Anxiety: Feeling anxious, difficulty managing worry, worrying too much, trouble relaxing, restlessness, irritability, and feeling afraid something awful might happen.    (Status: maintained)  Goals:   Marelin experiences symptoms of depression and anxiety.   Treatment plan signed and available on s-drive:  No, pending signature.    Target Date: 03/14/2024 Frequency: Weekly  Progress: 0 Modality: individual    Therapist will provide referrals for additional resources as appropriate.  Therapist will provide psycho-education regarding Lakisha's diagnosis and corresponding treatment approaches and interventions. [name], LCSW will support the patient's ability to achieve the goals identified. will employ CBT, BA, Problem-solving, Solution Focused, Mindfulness,  coping skills, & other evidenced-based practices will  be  used to promote progress towards healthy functioning to help manage decrease symptoms associated with her diagnosis.   Reduce overall level, frequency, and intensity of the feelings of depression, anxiety and panic evidenced by decreased overall symptoms from 6 to 7 days/week to 0 to 1 days/week per client report for at least 3 consecutive months. Verbally express understanding of the relationship between feelings of depression, anxiety and their impact on thinking patterns and behaviors. Verbalize an understanding of the role that distorted thinking plays in creating fears, excessive worry, and ruminations.    [name] participated in the creation of the treatment plan)   [name], LCSW

## 2023-03-27 ENCOUNTER — Encounter: Payer: Self-pay | Admitting: Family Medicine

## 2023-03-28 ENCOUNTER — Other Ambulatory Visit: Payer: Self-pay | Admitting: Family Medicine

## 2023-03-28 DIAGNOSIS — M170 Bilateral primary osteoarthritis of knee: Secondary | ICD-10-CM

## 2023-03-29 NOTE — Telephone Encounter (Signed)
 Requested medication (s) are due for refill today - yes  Requested medication (s) are on the active medication list -yes  Future visit scheduled -no  Last refill: 06/20/22 #30 3RF  Notes to clinic: non delegated Rx  Requested Prescriptions  Pending Prescriptions Disp Refills   traMADol (ULTRAM) 50 MG tablet [Pharmacy Med Name: TRAMADOL 50MG  TABLETS] 30 tablet     Sig: TAKE 1 TABLET(50 MG) BY MOUTH EVERY 8 HOURS AS NEEDED     Not Delegated - Analgesics:  Opioid Agonists Failed - 03/29/2023 10:26 AM      Failed - This refill cannot be delegated      Failed - Urine Drug Screen completed in last 360 days      Passed - Valid encounter within last 3 months    Recent Outpatient Visits           1 month ago Atypical chest pain   [facility]   3 months ago Infected sebaceous cyst   [facility] Limes, MD   4 months ago Depressive disorder   [place] [facility] Malva Limes, MD   5 months ago No-show for appointment   [facility] Malva Limes, MD   6 months ago Urinary tract infection without hematuria, site unspecified   [facility]va Limes, MD                 Requested Prescriptions  Pending Prescriptions Disp Refills   traMADol (ULTRAM) 50 MG tablet [Pharmacy Med Name: TRAMADOL 50MG  TABLETS] 30 tablet     Sig: TAKE 1 TABLET(50 MG) BY MOUTH EVERY 8 HOURS AS NEEDED     Not Delegated - Analgesics:  Opioid Agonists Failed - 03/29/2023 10:26 AM      Failed - This refill cannot be delegated      Failed - Urine Drug Screen completed in last 360 days      Passed - Valid encounter within last 3 months    Recent Outpatient Visits           1 month ago Atypical chest pain   [place] [facility]va Limes, MD   3 months ago Infected sebaceous cyst   [facility]va Limes, MD   4 months ago Depressive disorder   [facility], [facility]   5 months ago No-show for appointment   [facility]   6 months ago Urinary tract infection without hematuria, site unspecified   [facility] Malva Limes, MD

## 2023-03-30 IMAGING — US US RENAL
1 series · 14 of 25 positions shown · non-contrast
Comparison: None.

CLINICAL DATA: Recurrent UTIs.

EXAM:
RENAL / URINARY TRACT ULTRASOUND COMPLETE

[Series 1: us renal · 0.30mm/px · 14 of 32 slices shown]
[im 1/32]
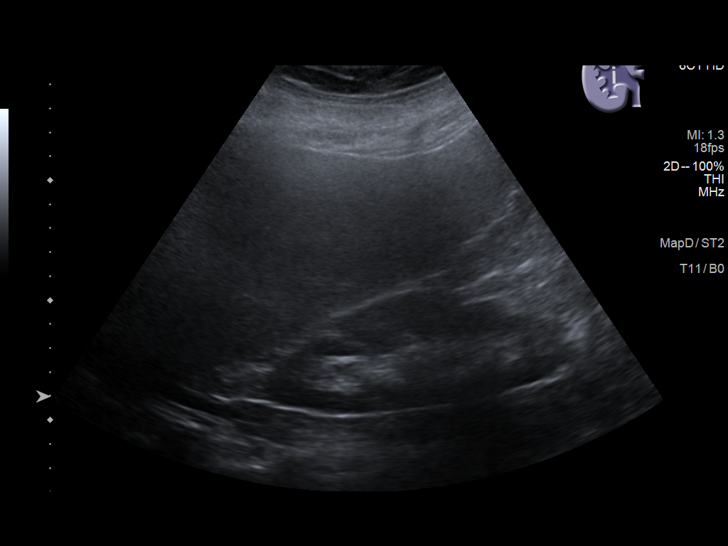
[im 3/32]
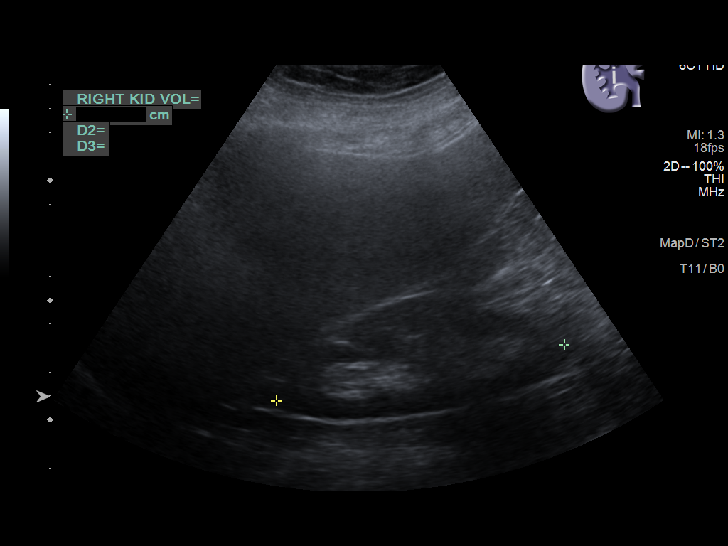
[im 6/32]
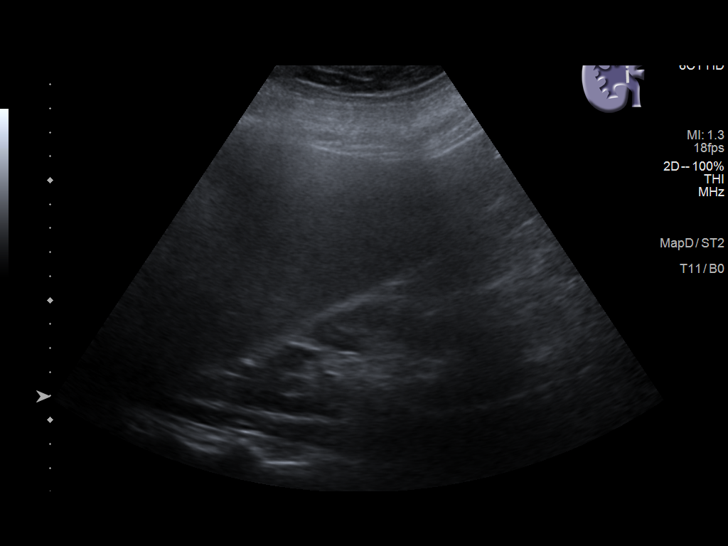
[im 8/32]
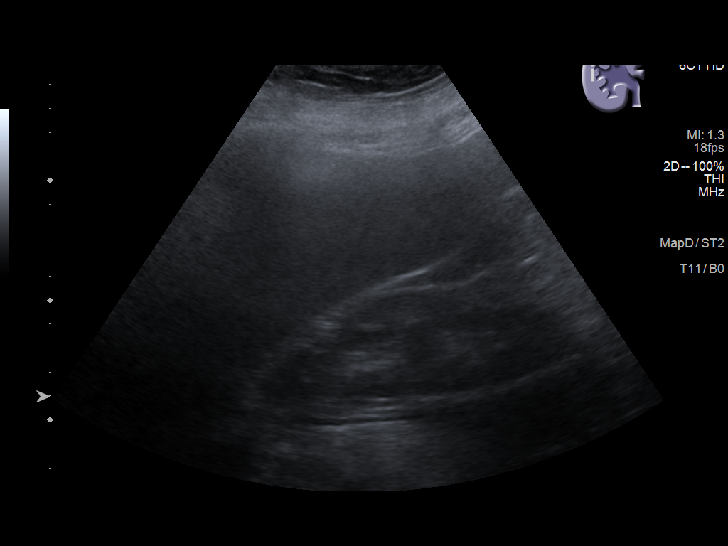
[im 11/32]
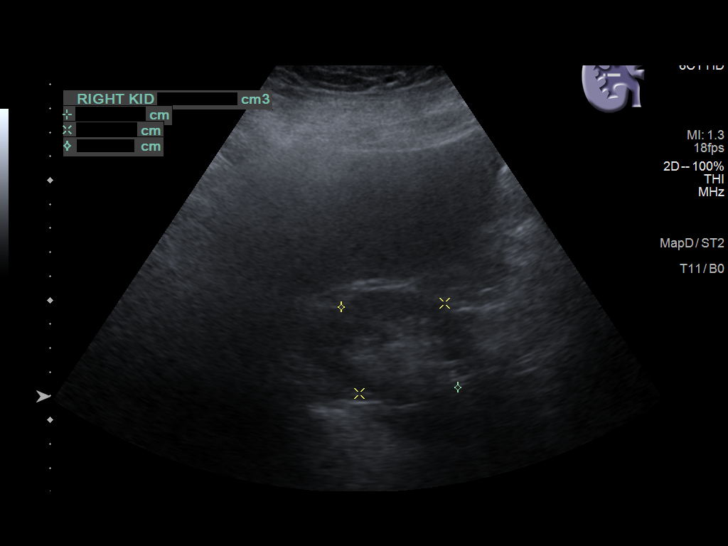
[im 12/32]
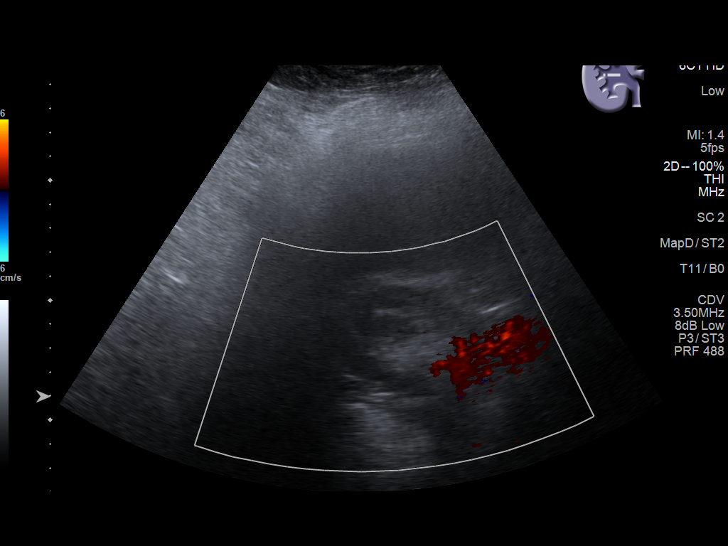
[im 15/32]
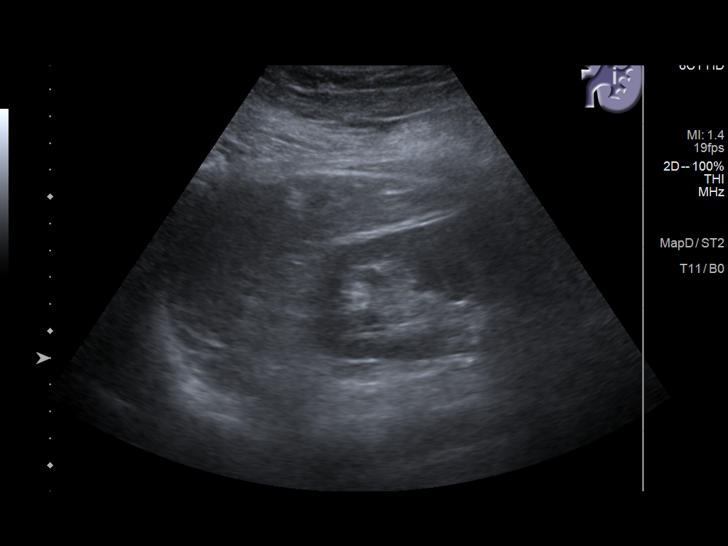
[im 17/32]
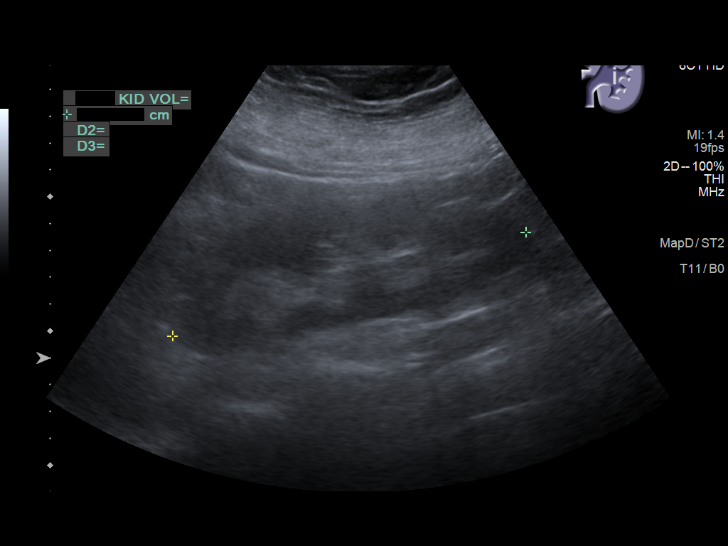
[im 20/32]
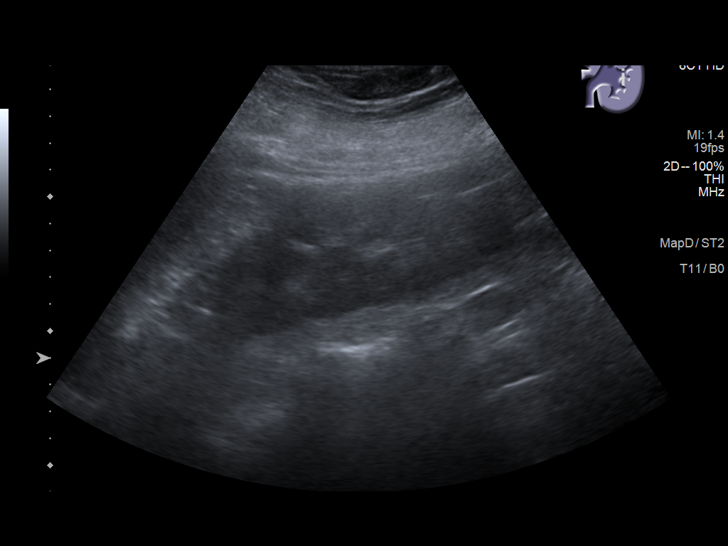
[im 21/32]
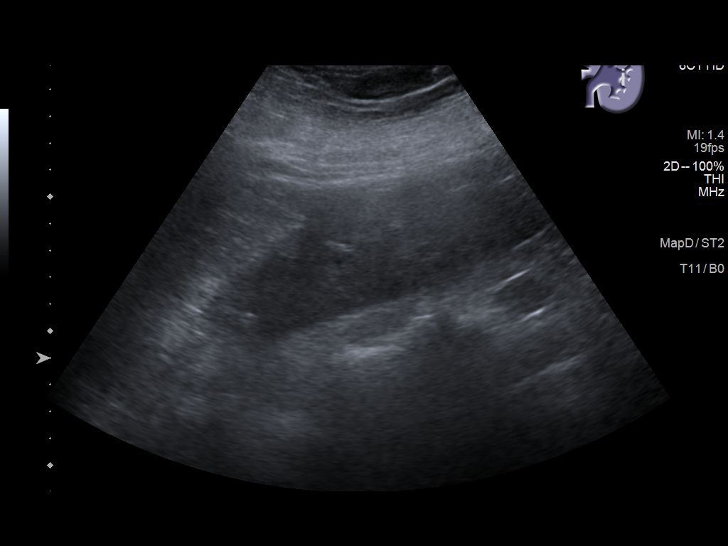
[im 24/32]
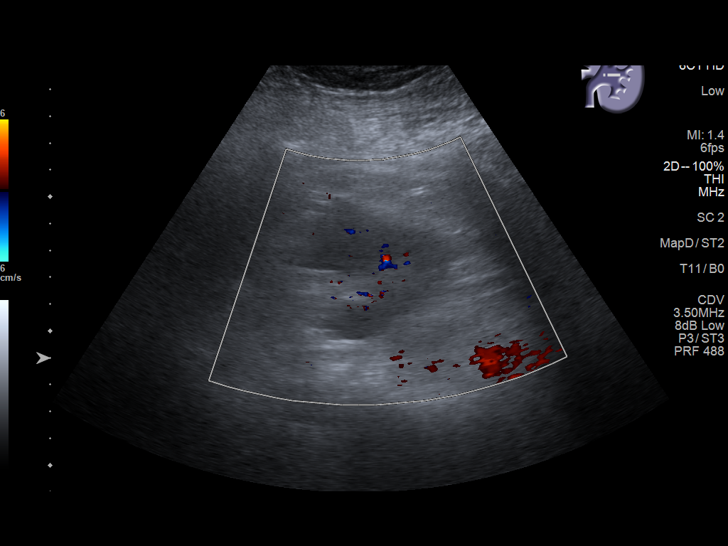
[im 26/32]
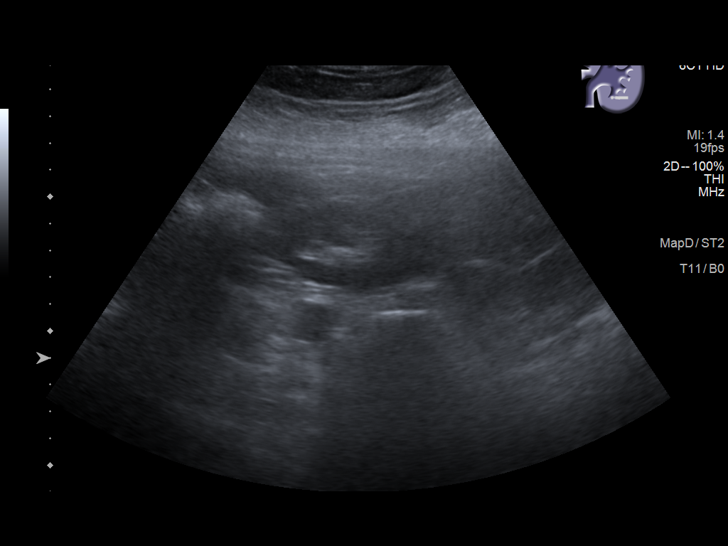
[im 29/32]
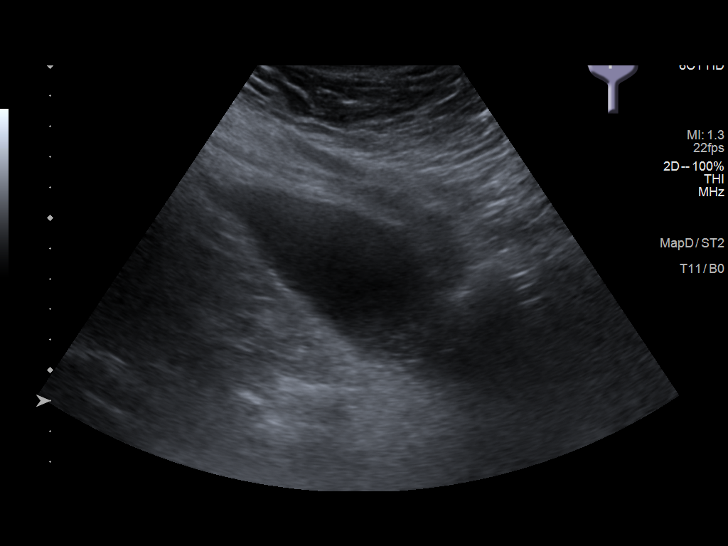
[im 32/32]
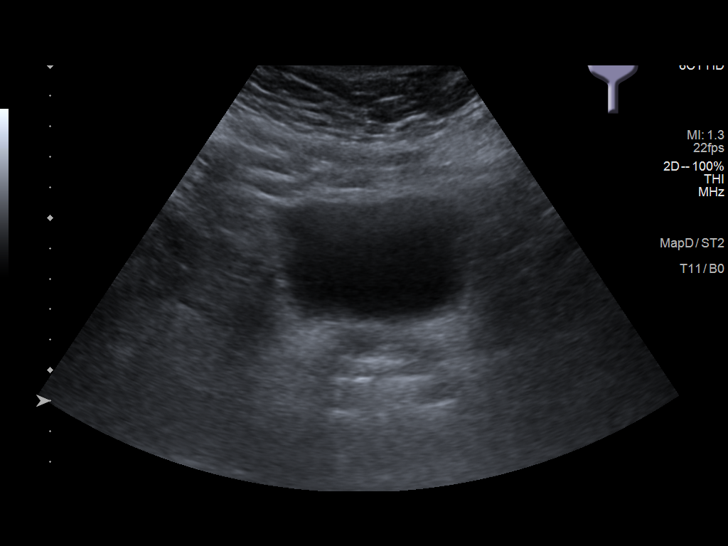

[14 of 25 positions shown; findings below may reference images not displayed]

FINDINGS: Right Kidney:

Renal measurements: 12.2 x 5.2 x 5.9 cm = volume: 196 mL.
Echogenicity within normal limits. No mass or hydronephrosis
visualized.

Left Kidney:

Renal measurements: 13.7 x 4.9 x 6.1 cm = volume: 217 mL.
Echogenicity within normal limits. No mass or hydronephrosis
visualized.

Bladder:

Appears normal for degree of bladder distention.

Other:

None.
IMPRESSION: Normal renal ultrasound.

## 2023-03-30 NOTE — Telephone Encounter (Signed)
 LOV 11*11*24 NOV 3*26*25 LRF [id number] LABS [id number]

## 2023-03-31 ENCOUNTER — Ambulatory Visit: Payer: Medicare HMO | Admitting: Psychology

## 2023-03-31 DIAGNOSIS — F3289 Other specified depressive episodes: Secondary | ICD-10-CM | POA: Diagnosis not present

## 2023-03-31 NOTE — Progress Notes (Signed)
 [facility] Counselor/Therapist Progress Note  Patient ID: DAISSY YERIAN, MRN: [id number]   Date: 03/31/23  Time Spent: 8:03  am - 8:58 am : 55 Minutes  Treatment Type: Individual Therapy.  Reported Symptoms: Depression and anxiety.   Mental Status Exam: Appearance:  Casual     Behavior: Appropriate  Motor: Normal  Speech/Language:  Clear and Coherent  Affect: Congruent  Mood: dysthymic  Thought process: normal  Thought content:   WNL  Sensory/Perceptual disturbances:   WNL  Orientation: oriented to person, place, time/date, and situation  Attention: Good  Concentration: Good  Memory: WNL  Fund of knowledge:  Good  Insight:   Good  Judgment:  Good  Impulse Control: Good   Risk Assessment: Danger to Self:  No Self-injurious Behavior: No Danger to Others: No Duty to Warn:no Physical Aggression / Violence:No  Access to Firearms a concern: No  Gang Involvement:No   In case of a mental health emergency:  73 - confidential suicide hotline. [facility]):        [address], [place] [address]       [phone] 3.   911  4.   Visiting Nearest ED.    Subjective:   [name] participated from home, via video and consented to treatment. Therapist participated from home office.She noted reflecting on her mother's passing and not receiving much support from her partner during that time. She noted her partner "ruined" her Christmas. She noted that despite this, her partner wanted support for the loss of his mother, who passed many years ago. She noted her recent couple's counseling session and noted discussing the state of the relationship, unmet needs, and general frustration. She noted frustration regarding her partner's lack of action towards his health and contributing factors to the current dynamics of the relationship. She noted her attempts to encourage him to be more engaged, proactively  address barriers, and communicate his concerns. We continued to process her experience and her feelings about the relationship, as a whole. We reviewed communication, conflict resolution, and assertiveness. Therapist modeled this during the session. We worked on identifying feelings. [name] was engaged and motivated during the session. She expressed commitment towards her goals the session goals. Therapist praised [name] for her effort and energy during the session. Therapist provided supportive therapy. A follow-up was scheduled for continued treatment, which she benefits from.   Interventions: interpersonal & CBT  Diagnosis:  Other depression  Psychiatric Treatment: No , pending sign-off.   Treatment Plan:  Client Abilities/Strengths Virgia is self-aware and motivated for change.   Support System: Family and friends.  Client Treatment Preferences OPT and continued med management.   Client Statement of Needs Tabia would like to be autonomous, work on being more expressive, work on being assertive, managing symptoms, processing past events, boundary setting, managing interpersonal stressors, practice mindfulness, and manage distress day-to-day.   Treatment Level Weekly  Symptoms  Depression: loss of interest, feeling down, trouble with sleep (insomnia), lethargy, fluctuating appetite (primarily low appetite), feeling bad about self, difficulty concentrating.    (Status: maintained)  Anxiety: Feeling anxious, difficulty managing worry, worrying too much, trouble relaxing, restlessness, irritability, and feeling afraid something awful might happen.    (Status: maintained)  Goals:   Blakleigh experiences symptoms of depression and anxiety.   Treatment plan signed and available on s-drive:  No, pending signature.    Target Date: 03/14/2024 Frequency: Weekly  Progress: 0 Modality: individual  Therapist will provide referrals for additional resources as appropriate.   Therapist will provide psycho-education regarding Insiya's diagnosis and corresponding treatment approaches and interventions. [name], LCSW will support the patient's ability to achieve the goals identified. will employ CBT, BA, Problem-solving, Solution Focused, Mindfulness,  coping skills, & other evidenced-based practices will be used to promote progress towards healthy functioning to help manage decrease symptoms associated with her diagnosis.   Reduce overall level, frequency, and intensity of the feelings of depression, anxiety and panic evidenced by decreased overall symptoms from 6 to 7 days/week to 0 to 1 days/week per client report for at least 3 consecutive months. Verbally express understanding of the relationship between feelings of depression, anxiety and their impact on thinking patterns and behaviors. Verbalize an understanding of the role that distorted thinking plays in creating fears, excessive worry, and ruminations.    [name] participated in the creation of the treatment plan)   [name], LCSW

## 2023-04-01 ENCOUNTER — Encounter: Payer: Self-pay | Admitting: Family Medicine

## 2023-04-01 DIAGNOSIS — M170 Bilateral primary osteoarthritis of knee: Secondary | ICD-10-CM

## 2023-04-03 ENCOUNTER — Encounter: Payer: Self-pay | Admitting: Family Medicine

## 2023-04-03 DIAGNOSIS — R052 Subacute cough: Secondary | ICD-10-CM

## 2023-04-03 MED ORDER — TRAMADOL HCL 50 MG PO TABS: 50.0000 mg | ORAL_TABLET | Freq: Three times a day (TID) | ORAL | 3 refills | Status: DC | PRN

## 2023-04-04 MED ORDER — PROMETHAZINE-DM 6.25-15 MG/5ML PO SYRP
5.0000 mL | ORAL_SOLUTION | Freq: Four times a day (QID) | ORAL | 0 refills | Status: DC | PRN
Start: 2023-04-04 — End: 2023-11-15

## 2023-04-07 ENCOUNTER — Ambulatory Visit: Payer: Medicare HMO | Admitting: Psychology

## 2023-04-07 DIAGNOSIS — F3289 Other specified depressive episodes: Secondary | ICD-10-CM | POA: Diagnosis not present

## 2023-04-07 NOTE — Progress Notes (Signed)
 [facility] Counselor/Therapist Progress Note  Patient ID: Sharon Bryan, MRN: [id number]   Date: 04/07/23  Time Spent: 12:03  pm - 12:58 pm : 55 Minutes  Treatment Type: Individual Therapy.  Reported Symptoms: Depression and anxiety.   Mental Status Exam: Appearance:  Casual     Behavior: Appropriate  Motor: Normal  Speech/Language:  Clear and Coherent  Affect: Congruent  Mood: dysthymic  Thought process: normal  Thought content:   WNL  Sensory/Perceptual disturbances:   WNL  Orientation: oriented to person, place, time/date, and situation  Attention: Good  Concentration: Good  Memory: WNL  Fund of knowledge:  Good  Insight:   Good  Judgment:  Good  Impulse Control: Good   Risk Assessment: Danger to Self:  No Self-injurious Behavior: No Danger to Others: No Duty to Warn:no Physical Aggression / Violence:No  Access to Firearms a concern: No  Gang Involvement:No   In case of a mental health emergency:  11 - confidential suicide hotline. [facility]):        [address], [place] [address]       [phone] 3.   911  4.   Visiting Nearest ED.    Subjective:   Sharon Bryan participated from home, via video and consented to treatment. Therapist participated from home office. She noted the events of the past two weeks. She noted continued frustration regarding her recent couple's counseling session. She noted feeling pressured by her partner to change her medication as a way to address their relationship issues. She noted feeling frustration regarding his behavior in and outside of session. She noted working on communicating her concerns to her partner to no avail. She noted feeling overwhelmed and a lack of motivation to engage in activities she usually engages in. She noted some improvement after communicating her concerns to her partner this previous evening. She noted anxiety about the  choices in front of her. Therapist highlighted Sharon Bryan's lack of focus of prioritizing her own needs and self-care and we worked on exploring this during the session. We worked on identifying the possible effect of this approach on her mood and functioning. Therapist encouraged Sharon Bryan to work on identifying her own needs, prioritizing self-care, and being assertive. Therapist validated Sharon Bryan's feelings and experience and provided supportive therapy. A follow-up was scheduled for continued treatment, which she benefits from.   Interventions: interpersonal & CBT  Diagnosis:  Other depression  Psychiatric Treatment: No , pending sign-off.   Treatment Plan:  Client Abilities/Strengths Sharon Bryan is self-aware and motivated for change.   Support System: Family and friends.  Client Treatment Preferences OPT and continued med management.   Client Statement of Needs Sharon Bryan would like to be autonomous, work on being more expressive, work on being assertive, managing symptoms, processing past events, boundary setting, managing interpersonal stressors, practice mindfulness, and manage distress day-to-day.   Treatment Level Weekly  Symptoms  Depression: loss of interest, feeling down, trouble with sleep (insomnia), lethargy, fluctuating appetite (primarily low appetite), feeling bad about self, difficulty concentrating.    (Status: maintained)  Anxiety: Feeling anxious, difficulty managing worry, worrying too much, trouble relaxing, restlessness, irritability, and feeling afraid something awful might happen.    (Status: maintained)  Goals:   Sharon Bryan experiences symptoms of depression and anxiety.   Treatment plan signed and available on s-drive:  No, pending signature.    Target Date: 03/14/2024 Frequency: Weekly  Progress: 0 Modality: individual    Therapist will provide  referrals for additional resources as appropriate.  Therapist will provide psycho-education regarding  Sharon Bryan's diagnosis and corresponding treatment approaches and interventions. Sharon Ovens, LCSW will support the patient's ability to achieve the goals identified. will employ CBT, BA, Problem-solving, Solution Focused, Mindfulness,  coping skills, & other evidenced-based practices will be used to promote progress towards healthy functioning to help manage decrease symptoms associated with her diagnosis.   Reduce overall level, frequency, and intensity of the feelings of depression, anxiety and panic evidenced by decreased overall symptoms from 6 to 7 days/week to 0 to 1 days/week per client report for at least 3 consecutive months. Verbally express understanding of the relationship between feelings of depression, anxiety and their impact on thinking patterns and behaviors. Verbalize an understanding of the role that distorted thinking plays in creating fears, excessive worry, and ruminations.    Sharon Bryan participated in the creation of the treatment plan)   Sharon Ovens, LCSW

## 2023-04-14 ENCOUNTER — Ambulatory Visit: Payer: Medicare HMO | Admitting: Psychology

## 2023-04-14 DIAGNOSIS — F3289 Other specified depressive episodes: Secondary | ICD-10-CM

## 2023-04-14 NOTE — Progress Notes (Signed)
 [facility] Counselor/Therapist Progress Note  Patient ID: Sharon Bryan, MRN: [id number]   Date: 04/14/23  Time Spent: 11:05  am - 12:00 pm : 55 Minutes  Treatment Type: Individual Therapy.  Reported Symptoms: Depression and anxiety.   Mental Status Exam: Appearance:  Casual     Behavior: Appropriate  Motor: Normal  Speech/Language:  Clear and Coherent  Affect: Congruent  Mood: dysthymic  Thought process: normal  Thought content:   WNL  Sensory/Perceptual disturbances:   WNL  Orientation: oriented to person, place, time/date, and situation  Attention: Good  Concentration: Good  Memory: WNL  Fund of knowledge:  Good  Insight:   Good  Judgment:  Good  Impulse Control: Good   Risk Assessment: Danger to Self:  No Self-injurious Behavior: No Danger to Others: No Duty to Warn:no Physical Aggression / Violence:No  Access to Firearms a concern: No  Gang Involvement:No   In case of a mental health emergency:  26 - confidential suicide hotline. [facility]):        [address], [place] [address]       [phone] 3.   911  4.   Visiting Nearest ED.    Subjective:   Sharon Bryan participated from home, via video and consented to treatment. Therapist participated from home office. She noted the events of the past two weeks. Kehaulani noted staying at her son's home, for a short time, to "get a break". She noted couple's counseling not going well and continued lack of effort in the relation. We worked on exploring this during the session and discussed the effect of this on her mood. She noted continued unmet needs and difficulty with her partner employing conflict resolution skills. She noted feeling a lack of acknowledgement of her effort and investment into the home. We worked on processing this and her attempts to communicate her needs. She endorsed recent high anxiety which affected her  ability to manage her overall stressors. She noted receiving a lack of support during that time from her partner. We processed this and what she would need from her partner in these situations. Therapist validated Sharon Bryan's feelings and experience during the session. Therapist  provided supportive therapy. A follow-up was scheduled for continued treatment.   Interventions: interpersonal & CBT  Diagnosis:  Other depression  Psychiatric Treatment: No , pending sign-off.   Treatment Plan:  Client Abilities/Strengths Marieann is self-aware and motivated for change.   Support System: Family and friends.  Client Treatment Preferences OPT and continued med management.   Client Statement of Needs Sharon Bryan would like to be autonomous, work on being more expressive, work on being assertive, managing symptoms, processing past events, boundary setting, managing interpersonal stressors, practice mindfulness, and manage distress day-to-day.   Treatment Level Weekly  Symptoms  Depression: loss of interest, feeling down, trouble with sleep (insomnia), lethargy, fluctuating appetite (primarily low appetite), feeling bad about self, difficulty concentrating.    (Status: maintained)  Anxiety: Feeling anxious, difficulty managing worry, worrying too much, trouble relaxing, restlessness, irritability, and feeling afraid something awful might happen.    (Status: maintained)  Goals:   Sharon Bryan experiences symptoms of depression and anxiety.   Treatment plan signed and available on s-drive:  No, pending signature.    Target Date: 03/14/2024 Frequency: Weekly  Progress: 0 Modality: individual    Therapist will provide referrals for additional resources as appropriate.  Therapist will provide psycho-education regarding Sharon Bryan's diagnosis and corresponding treatment approaches  and interventions. [name], LCSW will support the patient's ability to achieve the goals identified. will employ  CBT, BA, Problem-solving, Solution Focused, Mindfulness,  coping skills, & other evidenced-based practices will be used to promote progress towards healthy functioning to help manage decrease symptoms associated with her diagnosis.   Reduce overall level, frequency, and intensity of the feelings of depression, anxiety and panic evidenced by decreased overall symptoms from 6 to 7 days/week to 0 to 1 days/week per client report for at least 3 consecutive months. Verbally express understanding of the relationship between feelings of depression, anxiety and their impact on thinking patterns and behaviors. Verbalize an understanding of the role that distorted thinking plays in creating fears, excessive worry, and ruminations.    [name] participated in the creation of the treatment plan)   [name], LCSW

## 2023-04-16 ENCOUNTER — Other Ambulatory Visit: Payer: Self-pay | Admitting: Family Medicine

## 2023-04-19 ENCOUNTER — Ambulatory Visit: Payer: Medicare HMO | Admitting: Emergency Medicine

## 2023-04-19 NOTE — Progress Notes (Signed)
 A user error has taken place: encounter opened in error, closed for administrative reasons.

## 2023-04-20 ENCOUNTER — Ambulatory Visit: Admitting: Psychology

## 2023-04-20 DIAGNOSIS — F3289 Other specified depressive episodes: Secondary | ICD-10-CM | POA: Diagnosis not present

## 2023-04-20 NOTE — Progress Notes (Signed)
 [facility] Counselor/Therapist Progress Note  Patient ID: GISSELA BLOCH, MRN: [id number]   Date: 04/20/23  Time Spent: 8:01  am - 8:47 pm : 46 Minutes   Treatment Type: Individual Therapy.  Reported Symptoms: Depression and anxiety.   Mental Status Exam: Appearance:  Casual     Behavior: Appropriate  Motor: Normal  Speech/Language:  Clear and Coherent  Affect: Congruent  Mood: dysthymic  Thought process: normal  Thought content:   WNL  Sensory/Perceptual disturbances:   WNL  Orientation: oriented to person, place, time/date, and situation  Attention: Good  Concentration: Good  Memory: WNL  Fund of knowledge:  Good  Insight:   Good  Judgment:  Good  Impulse Control: Good   Risk Assessment: Danger to Self:  No Self-injurious Behavior: No Danger to Others: No Duty to Warn:no Physical Aggression / Violence:No  Access to Firearms a concern: No  Gang Involvement:No   In case of a mental health emergency:  58 - confidential suicide hotline. [facility]):        [address], [place] [address]       [phone] 3.   911  4.   Visiting Nearest ED.    Subjective:   [name] participated from home, via video and consented to treatment. Therapist participated from home office. She noted the events of the past two weeks. [name] noted a recent reduction in relationship stressors but noted a lack of improvement in the relationship. She noted her partner refusing to attend couple's counseling this past week.She noted frustration in the relationship due to her partner's lack of drive and engagement in the relationship or in friendships.  She noted her partner being unreliable and noted how this has affected her and the relationship, as a whole. She noted a general lack of communication and general thoughtfulness. We continued to process her feelings, expectations, and needs. She noted frustration  regarding her partner's communication of his worry to others but not to her. We explored the relationship dynamics and history and how this affects her mood and relationship outlook. Therapist validated [name]'s feeling and experience. Therapist encouraged continued boundary setting, assertive communication, and identification of needs. Therapist provided supportive therapy. A Follow-up was scheduled for continued treatment.   Interventions: interpersonal & CBT  Diagnosis:  Other depression  Psychiatric Treatment: No , pending sign-off.   Treatment Plan:  Client Abilities/Strengths Londin is self-aware and motivated for change.   Support System: Family and friends.  Client Treatment Preferences OPT and continued med management.   Client Statement of Needs Janautica would like to be autonomous, work on being more expressive, work on being assertive, managing symptoms, processing past events, boundary setting, managing interpersonal stressors, practice mindfulness, and manage distress day-to-day.   Treatment Level Weekly  Symptoms  Depression: loss of interest, feeling down, trouble with sleep (insomnia), lethargy, fluctuating appetite (primarily low appetite), feeling bad about self, difficulty concentrating.    (Status: maintained)  Anxiety: Feeling anxious, difficulty managing worry, worrying too much, trouble relaxing, restlessness, irritability, and feeling afraid something awful might happen.    (Status: maintained)  Goals:   Summit experiences symptoms of depression and anxiety.   Treatment plan signed and available on s-drive:  No, pending signature.    Target Date: 03/14/2024 Frequency: Weekly  Progress: 0 Modality: individual    Therapist will provide referrals for additional resources as appropriate.  Therapist will provide psycho-education regarding Merly's diagnosis and corresponding treatment approaches and  interventions. [name], LCSW will  support the patient's ability to achieve the goals identified. will employ CBT, BA, Problem-solving, Solution Focused, Mindfulness,  coping skills, & other evidenced-based practices will be used to promote progress towards healthy functioning to help manage decrease symptoms associated with her diagnosis.   Reduce overall level, frequency, and intensity of the feelings of depression, anxiety and panic evidenced by decreased overall symptoms from 6 to 7 days/week to 0 to 1 days/week per client report for at least 3 consecutive months. Verbally express understanding of the relationship between feelings of depression, anxiety and their impact on thinking patterns and behaviors. Verbalize an understanding of the role that distorted thinking plays in creating fears, excessive worry, and ruminations.    [name] participated in the creation of the treatment plan)   [name], LCSW

## 2023-04-27 ENCOUNTER — Ambulatory Visit: Admitting: Psychology

## 2023-04-27 DIAGNOSIS — F3289 Other specified depressive episodes: Secondary | ICD-10-CM

## 2023-04-27 NOTE — Progress Notes (Signed)
 [facility] Counselor/Therapist Progress Note  Patient ID: Sharon Bryan, MRN: [id number]   Date: 04/27/23  Time Spent: 8:07  am - 8:59 am :  52 Minutes   Treatment Type: Individual Therapy.  Reported Symptoms: Depression and anxiety.   Mental Status Exam: Appearance:  Casual     Behavior: Appropriate  Motor: Normal  Speech/Language:  Clear and Coherent  Affect: Congruent  Mood: dysthymic  Thought process: normal  Thought content:   WNL  Sensory/Perceptual disturbances:   WNL  Orientation: oriented to person, place, time/date, and situation  Attention: Good  Concentration: Good  Memory: WNL  Fund of knowledge:  Good  Insight:   Good  Judgment:  Good  Impulse Control: Good   Risk Assessment: Danger to Self:  No Self-injurious Behavior: No Danger to Others: No Duty to Warn:no Physical Aggression / Violence:No  Access to Firearms a concern: No  Gang Involvement:No   In case of a mental health emergency:  23 - confidential suicide hotline. [facility]):        [address], [place] [address]       [phone] 3.   911  4.   Visiting Nearest ED.    Subjective:   Sharon Bryan participated from home, via video and consented to treatment. Therapist participated from home office. She noted the events of the past two weeks. She noted frustration regarding her partner's continued lack of investment in the relationship and couple's therapy. She noted experiencing frustration regarding her partner's avoidance of topics & lack of responsibility for his own decision-making. We worked on exploring her feelings and frustrations during the session. We worked on identifying her own needs going forward. Therapist reviewed assertiveness and clear communication. She endorsed rumination regarding the state of her relationship. We explore the possible transition of leaving the relationship and home and the  necessary adjustments she would experience as a result. She noted a need to possibly address her needs prior to the transition so that she can feel a sense of peace about this. Therapist encouraged Sharon Bryan to create a list, between sessions, to be processed during our follow-up. Therapist validated Sharon Bryan's feelings and experience and provided supportive therapy. A Follow-up was scheduled for continued treatment which she benefit from.   Interventions: interpersonal & CBT  Diagnosis:  Other depression  Psychiatric Treatment: No , pending sign-off.   Treatment Plan:  Client Abilities/Strengths Sharon Bryan is self-aware and motivated for change.   Support System: Family and friends.  Client Treatment Preferences OPT and continued med management.   Client Statement of Needs Sharon Bryan would like to be autonomous, work on being more expressive, work on being assertive, managing symptoms, processing past events, boundary setting, managing interpersonal stressors, practice mindfulness, and manage distress day-to-day.   Treatment Level Weekly  Symptoms  Depression: loss of interest, feeling down, trouble with sleep (insomnia), lethargy, fluctuating appetite (primarily low appetite), feeling bad about self, difficulty concentrating.    (Status: maintained)  Anxiety: Feeling anxious, difficulty managing worry, worrying too much, trouble relaxing, restlessness, irritability, and feeling afraid something awful might happen.    (Status: maintained)  Goals:   Aneli experiences symptoms of depression and anxiety.   Treatment plan signed and available on s-drive:  No, pending signature.    Target Date: 03/14/2024 Frequency: Weekly  Progress: 0 Modality: individual    Therapist will provide referrals for additional resources as appropriate.  Therapist will provide psycho-education regarding Chellsea's diagnosis and corresponding  treatment approaches and interventions. [name], LCSW  will support the patient's ability to achieve the goals identified. will employ CBT, BA, Problem-solving, Solution Focused, Mindfulness,  coping skills, & other evidenced-based practices will be used to promote progress towards healthy functioning to help manage decrease symptoms associated with her diagnosis.   Reduce overall level, frequency, and intensity of the feelings of depression, anxiety and panic evidenced by decreased overall symptoms from 6 to 7 days/week to 0 to 1 days/week per client report for at least 3 consecutive months. Verbally express understanding of the relationship between feelings of depression, anxiety and their impact on thinking patterns and behaviors. Verbalize an understanding of the role that distorted thinking plays in creating fears, excessive worry, and ruminations.    Sharon Bryan participated in the creation of the treatment plan)   [name], LCSW

## 2023-05-08 ENCOUNTER — Ambulatory Visit (INDEPENDENT_AMBULATORY_CARE_PROVIDER_SITE_OTHER): Admitting: Psychology

## 2023-05-08 DIAGNOSIS — F3289 Other specified depressive episodes: Secondary | ICD-10-CM | POA: Diagnosis not present

## 2023-05-08 NOTE — Progress Notes (Signed)
 [facility] Counselor/Therapist Progress Note  Patient ID: Sharon Bryan, MRN: [id number]   Date: 05/08/23  Time Spent: 9:01 am - 9:55 am :  54 Minutes   Treatment Type: Individual Therapy.  Reported Symptoms: Depression and anxiety.   Mental Status Exam: Appearance:  Casual     Behavior: Appropriate  Motor: Normal  Speech/Language:  Clear and Coherent  Affect: Congruent  Mood: dysthymic  Thought process: normal  Thought content:   WNL  Sensory/Perceptual disturbances:   WNL  Orientation: oriented to person, place, time/date, and situation  Attention: Good  Concentration: Good  Memory: WNL  Fund of knowledge:  Good  Insight:   Good  Judgment:  Good  Impulse Control: Good   Risk Assessment: Danger to Self:  No Self-injurious Behavior: No Danger to Others: No Duty to Warn:no Physical Aggression / Violence:No  Access to Firearms a concern: No  Gang Involvement:No   In case of a mental health emergency:  41 - confidential suicide hotline. [facility]):        [address], [place] [address]       [phone] 3.   911  4.   Visiting Nearest ED.    Subjective:   Sharon Bryan participated from home, via video and consented to treatment. Therapist participated from office. Sharon Bryan discussed the events of the past week She noted attending couple's therapy and noted that "it was a shit show". She noted her decision to discontinue couple's counseling and pursue separation.  She discussed her recent couples counseling appointment and noted continued lack of effort from her partner to resolve issues, communicate clearly, and take responsibility for her actions.  She noted her partner's attempts to be secretive regarding communication of others and how she felt regarding this.  She discussed the need to leave the home and live with family.  She noted her partners dislike of this plan despite a lot of  effort to address significant relationship issues.  We worked on exploring her feelings and his past experiences.  Additionally, we worked on identifying her needs going forward with therapist encouraged Sharon Bryan to write down tentative plan, and secure place, that would help facilitate this transition.  Sharon Bryan discussed feeling frustration, disappointment, sadness regarding this decision.  We worked on processing this and reviewed coping and boundaries.  Therapist encouraged Sharon Bryan to engage in self-care consistently, being on positive supports, and maintain her boundaries as she navigates this upcoming stage.  Therapist validated Sharon Bryan's feelings and experience and provide supportive therapy.  A follow-up was scheduled for continued treatment with Sharon Bryan benefits from.  She will contact therapist, half-time, should the need arise.   Interventions: interpersonal & CBT  Diagnosis:  Other depression  Psychiatric Treatment: No , pending sign-off.   Treatment Plan:  Client Abilities/Strengths Sharon Bryan is self-aware and motivated for change.   Support System: Family and friends.  Client Treatment Preferences OPT and continued med management.   Client Statement of Needs Sharon Bryan would like to be autonomous, work on being more expressive, work on being assertive, managing symptoms, processing past events, boundary setting, managing interpersonal stressors, practice mindfulness, and manage distress day-to-day.   Treatment Level Weekly  Symptoms  Depression: loss of interest, feeling down, trouble with sleep (insomnia), lethargy, fluctuating appetite (primarily low appetite), feeling bad about self, difficulty concentrating.    (Status: maintained)  Anxiety: Feeling anxious, difficulty managing worry, worrying too much, trouble relaxing, restlessness, irritability, and feeling afraid something awful  might happen.    (Status: maintained)  Goals:   Sharon Bryan experiences symptoms of  depression and anxiety.   Treatment plan signed and available on s-drive:  No, pending signature.    Target Date: 03/14/2024 Frequency: Weekly  Progress: 0 Modality: individual    Therapist will provide referrals for additional resources as appropriate.  Therapist will provide psycho-education regarding Sharon Bryan's diagnosis and corresponding treatment approaches and interventions. Sharon Boyden, LCSW will support the patient's ability to achieve the goals identified. will employ CBT, BA, Problem-solving, Solution Focused, Mindfulness,  coping skills, & other evidenced-based practices will be used to promote progress towards healthy functioning to help manage decrease symptoms associated with her diagnosis.   Reduce overall level, frequency, and intensity of the feelings of depression, anxiety and panic evidenced by decreased overall symptoms from 6 to 7 days/week to 0 to 1 days/week per client report for at least 3 consecutive months. Verbally express understanding of the relationship between feelings of depression, anxiety and their impact on thinking patterns and behaviors. Verbalize an understanding of the role that distorted thinking plays in creating fears, excessive worry, and ruminations.    Sharon Bryan participated in the creation of the treatment plan)   Sharon Boyden, LCSW

## 2023-05-09 NOTE — Progress Notes (Deleted)
 PCP: Malva Limes, MD   No chief complaint on file.   HPI:      [name] is a 58 y.o. G3P3 whose LMP was Patient's last menstrual period was 04/26/2002 (lmp unknown)., presents today for her NP annual examination.  Her menses are absent due to hyst, no PMB.   Sex activity: {sex active: 315163}. She {does:18564} have vaginal dryness.  Last Pap: {EAVW:098119147}  Results were: {norm/abn:16707::"no abnormalities"} /neg HPV DNA.  Hx of STDs: {STD hx:14358}  Last mammogram: 04/07/22 Results were: normal--routine follow-up in 12 months There is no FH of breast cancer. There is no FH of ovarian cancer. The patient {does:18564} do self-breast exams.  Colonoscopy: 2019 at [facility]; Repeat due after 10*** years.   Tobacco use: {tob:20664} Alcohol use: {Alcohol:11675} No drug use Exercise: {exercise:31265}  She {does:18564} get adequate calcium and Vitamin D in her diet.  Labs with PCP.   Patient Active Problem List   Diagnosis Date Noted   OSA (obstructive sleep apnea) 11/29/2022   Vitamin D deficiency 07/27/2022   Depression 01/03/2021   Change in facial mole 06/11/2019   Encounter for screening colonoscopy    Dyspnea 03/17/2015   Hyperkeratosis 02/13/2015   Dizziness 08/04/2014   Dysphasia 08/04/2014   GERD (gastroesophageal reflux disease) 08/04/2014   Goiter 08/04/2014   Arthralgia of hip 08/04/2014   Hot flashes 08/04/2014   Menopause 08/04/2014   Migraine 08/04/2014   Numbness and tingling of right arm and leg 08/04/2014   Morbid obesity (HCC) 08/04/2014   Fibromyalgia 07/18/2014   Allergic rhinitis 07/18/2014   Thyroid cyst 10/27/2010   Malaise and fatigue 12/17/2008   Anemia, iron deficiency 09/02/2008   Anxiety disorder 09/02/2008   Depressive disorder 09/02/2008   Seizure disorder (HCC) 07/08/2008   Epilepsy (HCC) 11/10/2003   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 Mia Creek. Partial, ovaries remain   CESAREAN SECTION  01/25/1996   COLONOSCOPY WITH PROPOFOL N/A 11/07/2017   Procedure: COLONOSCOPY WITH PROPOFOL;  Surgeon: Toney Reil, MD;  Location: [facility] ENDOSCOPY;  Service: Gastroenterology;  Laterality: N/A;   CRANIOTOMY  01/25/2003   for artetiovenius malformation   TUBAL LIGATION  01/25/1996    Family History  Problem Relation Age of Onset   Hypertension Mother    Arthritis Mother    Breast cancer Sister 31   Breast cancer Other 30       neice   Breast cancer Other        mcousin    Social History   Socioeconomic History   Marital status: Divorced    Spouse name: Not on file   Number of children: Not on file   Years of education: Not on file   Highest education level: Not on file  Occupational History   Not on file  Tobacco Use   Smoking status: Former    Current packs/day: 0.00    Types: Cigarettes    Quit date: 01/25/2007    Years since quitting: 16.2   Smokeless tobacco: Never   Tobacco comments:    Smoked for about 15 years, smoked about 1 pack a day.  Vaping Use   Vaping status: Never Used  Substance and Sexual Activity   Alcohol use: Yes    Comment: occasional use; 1-2 times a year   Drug use: No   Sexual activity: Not on file  Other Topics Concern  Not on file  Social History Narrative   Not on file   Social Drivers of Health   Financial Resource Strain: Low Risk  (04/18/2022)   Overall Financial Resource Strain (CARDIA)    Difficulty of Paying Living Expenses: Not hard at all  Food Insecurity: No Food Insecurity (04/18/2022)   Hunger Vital Sign    Worried About Running Out of Food in the Last Year: Never true    Ran Out of Food in the Last Year: Never true  Transportation Needs: No Transportation Needs (04/18/2022)   PRAPARE - Administrator, Civil Service (Medical): No    Lack of Transportation (Non-Medical): No  Physical Activity: Insufficiently  Active (04/18/2022)   Exercise Vital Sign    Days of Exercise per Week: 2 days    Minutes of Exercise per Session: 20 min  Stress: No Stress Concern Present (04/18/2022)   [employer] of Occupational Health - Occupational Stress Questionnaire    Feeling of Stress : Not at all  Social Connections: Moderately Isolated (04/18/2022)   Social Connection and Isolation Panel [NHANES]    Frequency of Communication with Friends and Family: More than three times a week    Frequency of Social Gatherings with Friends and Family: Twice a week    Attends Religious Services: Never    Database administrator or Organizations: No    Attends Banker Meetings: Never    Marital Status: Living with partner  Intimate Partner Violence: Not At Risk (04/18/2022)   Humiliation, Afraid, Rape, and Kick questionnaire    Fear of Current or Ex-Partner: No    Emotionally Abused: No    Physically Abused: No    Sexually Abused: No     Current Outpatient Medications:    Aspirin-Acetaminophen-Caffeine (EXCEDRIN PO), Take 1 tablet daily as needed by mouth., Disp: , Rfl:    busPIRone (BUSPAR) 30 MG tablet, TAKE 1 TABLET BY MOUTH TWICE DAILY, Disp: 180 tablet, Rfl: 4   Cholecalciferol (VITAMIN D3) 125 MCG (5000 UT) CAPS, Take 1 capsule (5,000 Units total) by mouth daily., Disp: 30 capsule, Rfl:    diazepam (VALIUM) 5 MG tablet, Take 1 tablet (5 mg total) by mouth every 8 (eight) hours as needed for anxiety. TAKE IN PLACE OF ALPRAZOLAM, NOT WITH IT, Disp: 90 tablet, Rfl: 2   DULoxetine (CYMBALTA) 60 MG capsule, TAKE 1 CAPSULE(60 MG) BY MOUTH DAILY, Disp: 90 capsule, Rfl: 0   estradiol (ESTRACE) 2 MG tablet, Take 1 tablet (2 mg total) by mouth daily., Disp: 90 tablet, Rfl: 0   fluticasone (FLONASE) 50 MCG/ACT nasal spray, Place 2 sprays into both nostrils daily., Disp: 16 g, Rfl: 6   lamoTRIgine (LAMICTAL) 100 MG tablet, TAKE 1 TABLET BY MOUTH TWICE DAILY, Disp: 180 tablet, Rfl: 4   levothyroxine (SYNTHROID)  75 MCG tablet, TAKE 1 TABLET(75 MCG) BY MOUTH DAILY, Disp: 90 tablet, Rfl: 0   Magnesium Oxide 420 MG TABS, Take 400 mg by mouth daily., Disp: , Rfl:    modafinil (PROVIGIL) 200 MG tablet, Take 2 tablets (400 mg total) by mouth every morning., Disp: 60 tablet, Rfl: 3   Multiple Vitamins-Minerals (CENTRUM SILVER PO), Take by mouth., Disp: , Rfl:    nabumetone (RELAFEN) 750 MG tablet, TAKE 1 TO 2 TABLETS(750 TO 1500 MG) BY MOUTH DAILY AS NEEDED FOR LEG AND KNEE PAIN, Disp: 180 tablet, Rfl: 2   neomycin-polymyxin-hydrocortisone (CORTISPORIN) 3.5-10000-1 OTIC suspension, SHAKE LIQUID AND INSTILL 3 TO 4 DROPS TO AFFECTED EAR FOUR  TIMES DAILY, Disp: 10 mL, Rfl: 3   nortriptyline (PAMELOR) 10 MG capsule, TAKE 1 TO 4 CAPSULES(10 TO 40 MG) BY MOUTH AT BEDTIME, Disp: 100 capsule, Rfl: 4   omeprazole (PRILOSEC) 20 MG capsule, TAKE 1 CAPSULE BY MOUTH EVERY DAY, Disp: 90 capsule, Rfl: 3   PARoxetine (PAXIL) 30 MG tablet, TAKE 2 TABLETS(60 MG) BY MOUTH DAILY, Disp: 180 tablet, Rfl: 4   phenytoin (DILANTIN) 100 MG ER capsule, Take 2 capsules (200 mg total) by mouth every morning AND 3 capsules (300 mg total) every evening., Disp: 450 capsule, Rfl: 3   pregabalin (LYRICA) 225 MG capsule, TAKE 1 CAPSULE BY MOUTH TWICE DAILY, Disp: 180 capsule, Rfl: 1   promethazine-dextromethorphan (PROMETHAZINE-DM) 6.25-15 MG/5ML syrup, Take 5 mLs by mouth 4 (four) times daily as needed for cough., Disp: 118 mL, Rfl: 0   traMADol (ULTRAM) 50 MG tablet, Take 1 tablet (50 mg total) by mouth every 8 (eight) hours as needed., Disp: 30 tablet, Rfl: 3   XIFAXAN 550 MG TABS tablet, TAKE 1 TABLET BY MOUTH THREE TIMES DAILY, Disp: 30 tablet, Rfl: 0     ROS:  Review of Systems BREAST: No symptoms    Objective: LMP 04/26/2002 (LMP Unknown)    OBGyn Exam  Results: No results found for this or any previous visit (from the past 24 hours).  Assessment/Plan:  No diagnosis found.   No orders of the defined types were placed in  this encounter.           GYN counsel {counseling: 16159}    F/U  No follow-ups on file.  Sair Faulcon B. Maurio Baize, PA-C 05/09/2023 12:51 PM

## 2023-05-11 ENCOUNTER — Encounter: Admitting: Obstetrics and Gynecology

## 2023-05-11 DIAGNOSIS — Z124 Encounter for screening for malignant neoplasm of cervix: Secondary | ICD-10-CM

## 2023-05-11 DIAGNOSIS — Z1211 Encounter for screening for malignant neoplasm of colon: Secondary | ICD-10-CM

## 2023-05-11 DIAGNOSIS — Z01419 Encounter for gynecological examination (general) (routine) without abnormal findings: Secondary | ICD-10-CM

## 2023-05-11 DIAGNOSIS — Z1151 Encounter for screening for human papillomavirus (HPV): Secondary | ICD-10-CM

## 2023-05-11 DIAGNOSIS — Z1231 Encounter for screening mammogram for malignant neoplasm of breast: Secondary | ICD-10-CM

## 2023-05-19 ENCOUNTER — Ambulatory Visit: Admitting: Psychology

## 2023-05-19 ENCOUNTER — Other Ambulatory Visit: Payer: Self-pay | Admitting: Family Medicine

## 2023-05-19 DIAGNOSIS — F3289 Other specified depressive episodes: Secondary | ICD-10-CM | POA: Diagnosis not present

## 2023-05-19 DIAGNOSIS — M7041 Prepatellar bursitis, right knee: Secondary | ICD-10-CM

## 2023-05-19 NOTE — Progress Notes (Signed)
 [facility] Counselor/Therapist Progress Note  Patient ID: Sharon Bryan, MRN: [id number]   Date: 05/19/23  Time Spent: 8:06 am - 8:58 am :  52 Minutes   Treatment Type: Individual Therapy.  Reported Symptoms: Depression and anxiety.   Mental Status Exam: Appearance:  Casual     Behavior: Appropriate  Motor: Normal  Speech/Language:  Clear and Coherent  Affect: Congruent  Mood: dysthymic  Thought process: normal  Thought content:   WNL  Sensory/Perceptual disturbances:   WNL  Orientation: oriented to person, place, time/date, and situation  Attention: Good  Concentration: Good  Memory: WNL  Fund of knowledge:  Good  Insight:   Good  Judgment:  Good  Impulse Control: Good   Risk Assessment: Danger to Self:  No Self-injurious Behavior: No Danger to Others: No Duty to Warn:no Physical Aggression / Violence:No  Access to Firearms a concern: No  Gang Involvement:No   In case of a mental health emergency:  15 - confidential suicide hotline. [facility]        [address], [place] [address]       [phone] 3.   911  4.   Visiting Nearest ED.    Subjective:   Sharon Bryan participated from home, via video and consented to treatment. Therapist participated from office. Sharon Bryan discussed the events of the past week. She noted feeling more psychologically and physically anxious. She endorsed "nervous sweating", tingling, and feeling on-edge. She noted her attempts to meditate but noted this being unsuccessful. She noted working on identifying possible contributing factors for her anxiety but could not identify a reason or cause. We worked on exploring this during the session. She noted continued stress due to relationship stressors. We worked on exploring this during the session. She noted difficulty navigating communication with her partner. She discussed that that have restarted couple's  counseling but noted the most recent session being stressful. We reviewed communication, assertiveness, and conflict resolution. Therapist modeled this during the session. She noted needing knee surgery but noted that she would have to lose 50# prior to surgery. She noted being hopeful that this upcoming surgery will reduce her pain and improve her overall mobility. Therapist validated [name]'s feelings and experience and provided supportive therapy. A follow-up was scheduled for continued treatment, which she benefits from.   Interventions: interpersonal & CBT  Diagnosis:  Other depression  Psychiatric Treatment: No , pending sign-off.   Treatment Plan:  Client Abilities/Strengths Azizah is self-aware and motivated for change.   Support System: Family and friends.  Client Treatment Preferences OPT and continued med management.   Client Statement of Needs Glender would like to be autonomous, work on being more expressive, work on being assertive, managing symptoms, processing past events, boundary setting, managing interpersonal stressors, practice mindfulness, and manage distress day-to-day.   Treatment Level Weekly  Symptoms  Depression: loss of interest, feeling down, trouble with sleep (insomnia), lethargy, fluctuating appetite (primarily low appetite), feeling bad about self, difficulty concentrating.    (Status: maintained)  Anxiety: Feeling anxious, difficulty managing worry, worrying too much, trouble relaxing, restlessness, irritability, and feeling afraid something awful might happen.    (Status: maintained)  Goals:   Dandria experiences symptoms of depression and anxiety.   Treatment plan signed and available on s-drive:  No, pending signature.    Target Date: 03/14/2024 Frequency: Weekly  Progress: 0 Modality: individual    Therapist will provide referrals for additional resources as appropriate.  Therapist will provide psycho-education regarding  Shia's diagnosis and corresponding treatment approaches and interventions. Belva Boyden, LCSW will support the patient's ability to achieve the goals identified. will employ CBT, BA, Problem-solving, Solution Focused, Mindfulness,  coping skills, & other evidenced-based practices will be used to promote progress towards healthy functioning to help manage decrease symptoms associated with her diagnosis.   Reduce overall level, frequency, and intensity of the feelings of depression, anxiety and panic evidenced by decreased overall symptoms from 6 to 7 days/week to 0 to 1 days/week per client report for at least 3 consecutive months. Verbally express understanding of the relationship between feelings of depression, anxiety and their impact on thinking patterns and behaviors. Verbalize an understanding of the role that distorted thinking plays in creating fears, excessive worry, and ruminations.    Devra Fontana participated in the creation of the treatment plan)   Belva Boyden, LCSW

## 2023-05-22 ENCOUNTER — Encounter: Payer: Self-pay | Admitting: Intensive Care

## 2023-05-22 ENCOUNTER — Emergency Department

## 2023-05-22 ENCOUNTER — Other Ambulatory Visit: Payer: Self-pay

## 2023-05-22 ENCOUNTER — Ambulatory Visit: Payer: Self-pay

## 2023-05-22 ENCOUNTER — Observation Stay
Admission: EM | Admit: 2023-05-22 | Discharge: 2023-05-22 | Disposition: A | Discharge status: Home / Self Care | Attending: Emergency Medicine | Admitting: Emergency Medicine

## 2023-05-22 DIAGNOSIS — R200 Anesthesia of skin: Secondary | ICD-10-CM | POA: Diagnosis present

## 2023-05-22 DIAGNOSIS — G9389 Other specified disorders of brain: Secondary | ICD-10-CM | POA: Diagnosis not present

## 2023-05-22 DIAGNOSIS — R791 Abnormal coagulation profile: Secondary | ICD-10-CM | POA: Diagnosis not present

## 2023-05-22 DIAGNOSIS — H538 Other visual disturbances: Secondary | ICD-10-CM | POA: Insufficient documentation

## 2023-05-22 DIAGNOSIS — Z79899 Other long term (current) drug therapy: Secondary | ICD-10-CM | POA: Insufficient documentation

## 2023-05-22 DIAGNOSIS — H539 Unspecified visual disturbance: Secondary | ICD-10-CM

## 2023-05-22 DIAGNOSIS — G40919 Epilepsy, unspecified, intractable, without status epilepticus: Secondary | ICD-10-CM | POA: Diagnosis present

## 2023-05-22 DIAGNOSIS — R202 Paresthesia of skin: Principal | ICD-10-CM | POA: Insufficient documentation

## 2023-05-22 HISTORY — DX: Other amnesia: R41.3

## 2023-05-22 LAB — CBC
HCT: 39.5 % (ref 36.0–46.0)
Hemoglobin: 13.7 g/dL (ref 12.0–15.0)
MCH: 32.4 pg (ref 26.0–34.0)
MCHC: 34.7 g/dL (ref 30.0–36.0)
MCV: 93.4 fL (ref 80.0–100.0)
Platelets: 279 10*3/uL (ref 150–400)
RBC: 4.23 MIL/uL (ref 3.87–5.11)
RDW: 13 % (ref 11.5–15.5)
WBC: 11.7 10*3/uL — ABNORMAL HIGH (ref 4.0–10.5)
nRBC: 0 % (ref 0.0–0.2)

## 2023-05-22 LAB — BASIC METABOLIC PANEL WITH GFR
Anion gap: 9 (ref 5–15)
BUN: 17 mg/dL (ref 6–20)
CO2: 26 mmol/L (ref 22–32)
Calcium: 9.5 mg/dL (ref 8.9–10.3)
Chloride: 102 mmol/L (ref 98–111)
Creatinine, Ser: 0.72 mg/dL (ref 0.44–1.00)
GFR, Estimated: >60 mL/min (ref >60)
Glucose, Bld: 92 mg/dL (ref 70–99)
Potassium: 4.1 mmol/L (ref 3.5–5.1)
Sodium: 137 mmol/L (ref 135–145)

## 2023-05-22 LAB — PROTIME-INR
INR: 1 (ref 0.8–1.2)
Prothrombin Time: 13.3 s (ref 11.4–15.2)

## 2023-05-22 LAB — ETHANOL: Alcohol, Ethyl (B): <15 mg/dL (ref <15)

## 2023-05-22 LAB — TROPONIN I (HIGH SENSITIVITY)
Troponin I (High Sensitivity): 4 ng/L (ref <18)
Troponin I (High Sensitivity): 7 ng/L (ref <18)

## 2023-05-22 LAB — TSH: TSH: 2.642 u[IU]/mL (ref 0.350–4.500)

## 2023-05-22 LAB — PHENYTOIN LEVEL, TOTAL: Phenytoin Lvl: 8.4 ug/mL — ABNORMAL LOW (ref 10.0–20.0)

## 2023-05-22 LAB — APTT: aPTT: 27 s (ref 24–36)

## 2023-05-22 MED ORDER — ONDANSETRON HCL 4 MG/2ML IJ SOLN: 4.0000 mg | Freq: Four times a day (QID) | INTRAMUSCULAR | Status: DC | PRN

## 2023-05-22 MED ORDER — LORAZEPAM 2 MG/ML IJ SOLN
1.0000 mg | INTRAMUSCULAR | Status: DC | PRN
Start: 2023-05-22 — End: 2023-05-22

## 2023-05-22 MED ORDER — ACETAMINOPHEN 325 MG RE SUPP: 650.0000 mg | RECTAL | Status: DC | PRN

## 2023-05-22 MED ORDER — ENOXAPARIN SODIUM 60 MG/0.6ML IJ SOSY: 0.5000 mg/kg | PREFILLED_SYRINGE | INTRAMUSCULAR | Status: DC

## 2023-05-22 MED ORDER — HYDROCODONE-ACETAMINOPHEN 5-325 MG PO TABS: 1.0000 | ORAL_TABLET | ORAL | Status: DC | PRN

## 2023-05-22 MED ORDER — SODIUM CHLORIDE 0.9 % IV SOLN
200.0000 mg | Freq: Once | INTRAVENOUS | Status: DC
Start: 2023-05-22 — End: 2023-05-23
  Filled 2023-05-22: qty 20

## 2023-05-22 MED ORDER — ONDANSETRON HCL 4 MG PO TABS: 4.0000 mg | ORAL_TABLET | Freq: Four times a day (QID) | ORAL | Status: DC | PRN

## 2023-05-22 MED ORDER — IOHEXOL 350 MG/ML SOLN
75.0000 mL | Freq: Once | INTRAVENOUS | Status: AC | PRN
  Administered 2023-05-22: 75 mL via INTRAVENOUS

## 2023-05-22 MED ORDER — SODIUM CHLORIDE 0.9 % IV SOLN: 75.0000 mL/h | INTRAVENOUS | Status: DC

## 2023-05-22 MED ORDER — ACETAMINOPHEN 325 MG PO TABS
650.0000 mg | ORAL_TABLET | ORAL | Status: DC | PRN
  Administered 2023-05-22: 650 mg via ORAL
  Filled 2023-05-22: qty 2

## 2023-05-22 NOTE — Discharge Instructions (Signed)
 You were seen in the emergency department for paresthesias, vision change.  You had a CT scan of your head that did not show any new abnormalities.  Your lab work was overall normal.  Your phenytoin  level was low.  It was recommended that you be admitted for further workup for possible partial seizures or stroke.  Ultimately you wanted to go home understanding the risk of ongoing seizures.  Follow-up closely with your neurologist.  Return to the emergency department at any time.  Take your medications as prescribed.

## 2023-05-22 NOTE — Telephone Encounter (Signed)
  Chief Complaint: R sided facial numbness Symptoms: R sided weakness, R sided facial numbness, HA, states "feels weird" Frequency: about 2 days ago, started around 1800 4/26 Pertinent Negatives: Patient denies fever Disposition: [x] ED /[] Urgent Care (no appt availability in office) / [] Appointment(In office/virtual)/ []  [facility]/ [] Home Care/ [] Refused Recommended Disposition /[] [facility]/ []  Follow-up with PCP Additional Notes: Pt states that 2 days ago she started feeling "weird". Pt states the R side of her face went numb. Pt states that she went to bed, 1930. Slept 12 hours. Pt woke and states that her face was drooping and she had a HA. Pt advised to go ED, ASAP and call 911. Pt states that she would like to go to the ED in [place]. Pt advised by RN to go to the nearest ED.  Copied from CRM [id number]. Topic: Clinical - Red Word Triage >> May 22, 2023 10:03 AM Tiffany B wrote: Red Word that prompted transfer to Nurse Triage: Right side of face went numb for several days. Patient states right eye is drooping. Reason for Disposition  [1] Numbness (i.e., loss of sensation) of the face, arm / hand, or leg / foot on one side of the body AND [2] sudden onset AND [3] present now  Answer Assessment - Initial Assessment Questions 1. SYMPTOM: "What is the main symptom you are concerned about?" (e.g., weakness, numbness)     Facial numbness, R sided numbness  2. ONSET: "When did this start?" (minutes, hours, days; while sleeping)     About 2 days ago 3. LAST NORMAL: "When was the last time you (the patient) were normal (no symptoms)?"     Saturday evening 4. PATTERN "Does this come and go, or has it been constant since it started?"  "Is it present now?"     constant 6. NEUROLOGIC SYMPTOMS: "Have you had any of the following symptoms: headache, dizziness, vision loss, double vision, changes in speech, unsteady on your feet?"     HA, unsteady on feet, R sided weakness, R  sided facial numbness  Protocols used: Neurologic Deficit-A-AH

## 2023-05-22 NOTE — ED Provider Notes (Addendum)
 [facility] Provider Note    Event Date/Time   First MD Initiated Contact with Patient 05/22/23 1458     (approximate)   History   Numbness   HPI  Sharon Bryan is a 58 y.o. female past medical history significant for seizure disorder, history of AVM with prior embolization and resection, depression, anxiety, presents to the emergency department for numbness.  History is provided by the patient and her significant other who is at bedside.  Patient states that on Saturday after eating dinner she started to feel weird.  Thought that she might just need some air so she went to step outside.  States that she noted at that time that she was having some numbness sensation and felt like she had gone to the dentist on the right side of her face.  Was also feeling like the fingertips on her right hand below her knuckles were feeling numb.  Felt like her vision was off at that time but is unable to further describe.  Does not believe that she had any double vision or dizziness.  States that she went to bed that night and on Sunday woke up and continued to have some ongoing tingling sensation to the right side of her face and hand that has continued today which brought her into the emergency department.  Describes some mild difficulty when swallowing pills.  Headache 2 days ago but denies any headache at this time.  No thunderclap headache.  States it was only a mild headache and does have a history of migraines.  History of AVM that was embolized and then resected approximately 20 years ago.  No history of CVA.  Patient does have a history of Bell's palsy.  Patient significant other states that she has had significant amount of stress and anxiety recently.  That she will intermittently have worsening numbness and a facial droop to the right side.  He has not noted any other new symptoms.  No new medication changes.  No falls or head trauma.  Scheduled to have a neurologist  visit with Dr. Walden Guise to establish care in 1 week.     Physical Exam   Triage Vital Signs: ED Triage Vitals  Encounter Vitals Group     BP 05/22/23 1213 127/84     Systolic BP Percentile --      Diastolic BP Percentile --      Pulse Rate 05/22/23 1213 98     Resp 05/22/23 1213 18     Temp 05/22/23 1213 98.1 F (36.7 C)     Temp Source 05/22/23 1213 Oral     SpO2 05/22/23 1213 97 %     Weight 05/22/23 1215 252 lb (114.3 kg)     Height 05/22/23 1215 5\' 2"  (1.575 m)     Head Circumference --      Peak Flow --      Pain Score 05/22/23 1215 4     Pain Loc --      Pain Education --      Exclude from Growth Chart --     Most recent vital signs: Vitals:   05/22/23 1830 05/22/23 2023  BP: 128/63 113/66  Pulse: 81 72  Resp: 14 17  Temp:    SpO2: 99% 94%    Physical Exam Constitutional:      Appearance: She is well-developed.  HENT:     Head: Atraumatic.  Eyes:     Extraocular Movements: Extraocular movements intact.  Conjunctiva/sclera: Conjunctivae normal.     Pupils: Pupils are equal, round, and reactive to light.  Cardiovascular:     Rate and Rhythm: Regular rhythm.  Pulmonary:     Effort: No respiratory distress.  Abdominal:     General: There is no distension.     Tenderness: There is no abdominal tenderness.  Musculoskeletal:        General: Normal range of motion.     Cervical back: Normal range of motion.  Skin:    General: Skin is warm.  Neurological:     Mental Status: She is alert. Mental status is at baseline.     GCS: GCS eye subscore is 4. GCS verbal subscore is 5. GCS motor subscore is 6.     Cranial Nerves: Facial asymmetry present.     Sensory: Sensation is intact.     Motor: Motor function is intact.     Coordination: Coordination is intact.     Gait: Gait is intact.     Comments: Mild facial droop with forehead sparing to the right side.  (Baseline according to significant other at bedside) sensation intact but decreased to the right  face.  Intact and symmetric to bilateral upper extremities and lower extremities.  Mild decrease sensation to the right fingertips when compared to the left.  Normal gait.     IMPRESSION / MDM / ASSESSMENT AND PLAN / ED COURSE  I reviewed the triage vital signs and the nursing notes.  Differential diagnosis including intracranial hemorrhage, electrolyte abnormality, migraine headache, complex migraine, CVA, stress-induced, low suspicion for giant cell given no headache and is 58.  ACS.  On chart review patient had a recent CTA that showed no signs of pulmonary embolism.  No ongoing chest pain at this time.   EKG  I, Viviano Ground, the attending physician, personally viewed and interpreted this ECG.   Rate: Normal  Rhythm: Normal sinus  Axis: Normal  Intervals: Normal  ST&T Change: None  No tachycardic or bradycardic dysrhythmias while on cardiac telemetry.  RADIOLOGY I independently reviewed imaging, my interpretation of imaging: CT scan of the head without signs of intracranial hemorrhage.  Read as no signs of intracranial hemorrhage or infarction.  Noted left-sided encephalomalacia.  CTA head and neck read as no acute findings.  LABS (all labs ordered are listed, but only abnormal results are displayed) Labs interpreted as -    Labs Reviewed  CBC - Abnormal; Notable for the following components:      Result Value   WBC 11.7 (*)    All other components within normal limits  PHENYTOIN  LEVEL, TOTAL - Abnormal; Notable for the following components:   Phenytoin  Lvl 8.4 (*)    All other components within normal limits  BASIC METABOLIC PANEL WITH GFR  PROTIME-INR  APTT  ETHANOL  TSH  LAMOTRIGINE  LEVEL  HIV ANTIBODY (ROUTINE TESTING W REFLEX)  TROPONIN I (HIGH SENSITIVITY)  TROPONIN I (HIGH SENSITIVITY)     MDM  Lab work overall reassuring with no significant electrolyte abnormality.  Will add on thyroid  studies and a phenytoin  level however have a low suspicion that  these are causing her symptoms.  Initial troponin is negative have low suspicion for ACS and do not feel that serial troponins are necessary.  Low risk heart score and is currently chest pain-free.  Recent CTA and has no other symptoms concerning for dissection or pulmonary embolism.  Patient has a complicated past medical history" including AVMs, seizure disorder and is not able  to get an MRI, will reach out and consult with neurology for further recommendations. Discussed with Dr. Cleone Dad.   Clinical Course as of 05/22/23 2132  Mon May 22, 2023  1902 Again discussed with neurology, recommended admission for evaluation tomorrow.  Recommended one-time dose of IV Vimpat 200 mg.  Recommended ordering routine EEG and neurology consultation.  Continue on home medications of lamotrigine  100 mg twice daily, phenytoin  200 mg in the morning, 300 at night, pregabalin  225 mg twice a day.  Takes Valium  twice a day. [SM]  2132 After admitting the patient to the hospitalist patient stated that she now wants to go home.  I talked with the patient she wants to go home.  Will cancel the hospitalist admission.  She does not want to stay and wait for her IV Vimpat.  States that she wants to follow-up with her neurologist and she can take her home nighttime medications that are with her in the car.  States that she understands the risk and benefits of discharge home with ongoing focal seizures.  Will return if any worsening symptoms.  Patient signed out AGAINST MEDICAL ADVICE.  Discussed return for any ongoing or worsening symptoms.  States that she will follow-up closely with her neurologist. [SM]    Clinical Course User Index [SM] Viviano Ground, MD     PROCEDURES:  Critical Care performed: No  Procedures  Patient's presentation is most consistent with acute presentation with potential threat to life or bodily function.   MEDICATIONS ORDERED IN ED: Medications  enoxaparin (LOVENOX) injection 57.5 mg (has no  administration in time range)  0.9 %  sodium chloride  infusion (has no administration in time range)  LORazepam (ATIVAN) injection 1 mg (has no administration in time range)  acetaminophen (TYLENOL) tablet 650 mg (650 mg Oral Given 05/22/23 2057)    Or  acetaminophen (TYLENOL) suppository 650 mg ( Rectal See Alternative 05/22/23 2057)  ondansetron  (ZOFRAN ) tablet 4 mg (has no administration in time range)    Or  ondansetron  (ZOFRAN ) injection 4 mg (has no administration in time range)  HYDROcodone-acetaminophen (NORCO/VICODIN) 5-325 MG per tablet 1 tablet (has no administration in time range)  lacosamide (VIMPAT) 200 mg in sodium chloride  0.9 % 25 mL IVPB (has no administration in time range)  iohexol  (OMNIPAQUE ) 350 MG/ML injection 75 mL (75 mLs Intravenous Contrast Given 05/22/23 1625)    FINAL CLINICAL IMPRESSION(S) / ED DIAGNOSES   Final diagnoses:  Paresthesia  Vision changes     Rx / DC Orders   ED Discharge Orders     None        Note:  This document was prepared using Dragon voice recognition software and may include unintentional dictation errors.   Viviano Ground, MD 05/22/23 Gorman Laughter    Viviano Ground, MD 05/22/23 2133

## 2023-05-22 NOTE — ED Notes (Signed)
 Dr Dodson Freestone notified and aware that pt had some trouble with her upper R peripheral vision during this RN's stroke scale assessment.

## 2023-05-22 NOTE — ED Triage Notes (Signed)
 Patient reports around 5pm Saturday the right side of her face and arm started feeling numb. Intermittent numbness in right foot toes. Reports coming in today because the numbness has still not subsided.   Reports central chest pain that started last night. Still ongoing today and describes as intermittent sharp pains  History bells palsy

## 2023-05-22 NOTE — ED Notes (Signed)
 Patient transported to CT

## 2023-05-22 NOTE — Plan of Care (Addendum)
 Addendum:  No acute pathology on my brief review of CTA but formal radiology report pending  Patient/family feels symptoms are worsening and possibly consistent with prior seizures per EDP   Please confirm home antiseizure medication regimen with patient and continue all that she confirms she is currently taking:  Lamotrigine  100 mg twice daily Phenytoin  200 mg in the morning, 300 at night Pregabalin  225 twice daily Valium  every 8 hours as needed (based on PDMP review it appears she is getting this very regularly) Okay to give a dose of Vimpat 200 mg once if no evidence of heart block on EKG Please order routine EEG  Neurology will formally consult tomorrow  Discussed with Dr. Dodson Freestone, this is a patient  "Sharon Bryan is a 58 y.o. female past medical history significant for seizure disorder, history of AVM with prior embolization and resection, depression, anxiety, presents to the emergency department for numbness.  History is provided by the patient and her significant other who is at bedside.  Patient states that on Saturday after eating dinner she started to feel weird.  Thought that she might just need some air so she went to step outside.  States that she noted at that time that she was having some numbness sensation and felt like she had gone to the dentist on the right side of her face.  Was also feeling like the fingertips on her right hand below her knuckles were feeling numb.  Felt like her vision was off at that time but is unable to further describe.  Does not believe that she had any double vision or dizziness.  States that she went to bed that night and on Sunday woke up and continued to have some ongoing tingling sensation to the right side of her face and hand that has continued today which brought her into the emergency department.  Describes some mild difficulty when swallowing pills.  Headache 2 days ago but denies any headache at this time.  No thunderclap headache.  States  it was only a mild headache and does have a history of migraines.  History of AVM that was embolized and then resected approximately 20 years ago.  No history of CVA.  Patient does have a history of Bell's palsy.  Patient significant other states that she has had significant amount of stress and anxiety recently.  That she will intermittently have worsening numbness and a facial droop to the right side.  He has not noted any other new symptoms.  No new medication changes.  No falls or head trauma.  Scheduled to have a neurologist visit with Dr. Walden Guise to establish care in 1 week. "  She reports she cannot get an MRI of her brain due to history of her AVM  I am asked to comment on management of this patient.  Her medication list is currently fairly complex and includes at least 4 potential medications effective for seizure:  Lamotrigine  100 mg twice daily Phenytoin  200 mg in the morning, 300 at night Pregabalin  225 twice daily Valium  every 8 hours as needed (based on PDMP review it appears she is getting this very regularly)  Tramadol  is additionally listed which would be expected to lower her seizure threshold and would be contraindicated, recommend discontinuation of this medication  Would also counsel about the risk of withdrawal seizures if benzodiazepines are being used regularly and end up being suddenly tapered  Recommend checking lamotrigine  level in addition to phenytoin  level, confirming phenytoin  level is in good  therapeutic range prior to discharge (lamotrigine  level will need to be followed up by outpatient neurology)  Given intermittent recurrent symptoms described, stroke would be highly unlikely.  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  If she feels her symptoms have progressed, are consistent with prior focal seizures, or there is any concern for focal status epilepticus, please do reengage neurology for full consultation.    Current Outpatient Medications  Medication Instructions   Aspirin-Acetaminophen-Caffeine (EXCEDRIN PO) 1 tablet, Daily PRN   busPIRone (BUSPAR) 30 MG tablet TAKE 1 TABLET BY MOUTH TWICE DAILY   diazepam  (VALIUM ) 5 mg, Oral, Every 8 hours PRN, TAKE IN PLACE OF ALPRAZOLAM , NOT WITH IT   DULoxetine  (CYMBALTA ) 60 MG capsule TAKE 1 CAPSULE(60 MG) BY MOUTH DAILY   estradiol  (ESTRACE ) 2 mg, Oral, Daily   fluticasone  (FLONASE ) 50 MCG/ACT nasal spray 2 sprays, Each Nare, Daily   lamoTRIgine  (LAMICTAL ) 100 MG tablet TAKE 1 TABLET BY MOUTH TWICE DAILY   levothyroxine  (SYNTHROID ) 75 MCG tablet TAKE 1 TABLET(75 MCG) BY MOUTH DAILY   Magnesium Oxide 400 mg, Daily   modafinil  (PROVIGIL ) 400 mg, Oral, Every morning   Multiple Vitamins-Minerals (CENTRUM SILVER PO) Take by mouth.   nabumetone  (RELAFEN ) 750 MG tablet TAKE 1 TO 2 TABLETS(750 TO 1500 MG) BY MOUTH DAILY AS NEEDED FOR LEG AND KNEE PAIN   neomycin -polymyxin-hydrocortisone (CORTISPORIN) 3.5-10000-1 OTIC suspension SHAKE LIQUID AND INSTILL 3 TO 4 DROPS TO AFFECTED EAR FOUR TIMES DAILY   nortriptyline  (PAMELOR ) 10 MG capsule TAKE 1 TO 4 CAPSULES(10 TO 40 MG) BY MOUTH AT BEDTIME   omeprazole (PRILOSEC) 20 MG capsule TAKE 1 CAPSULE BY MOUTH EVERY DAY   PARoxetine  (PAXIL ) 30 MG tablet TAKE 2 TABLETS(60 MG) BY MOUTH DAILY   phenytoin  (DILANTIN ) 100 MG ER capsule Take 2 capsules (200 mg total) by mouth every morning AND 3 capsules (300 mg total) every evening.   pregabalin  (LYRICA ) 225 mg, Oral, 2 times daily   promethazine -dextromethorphan (PROMETHAZINE -DM) 6.25-15 MG/5ML syrup 5 mLs, Oral, 4 times daily PRN   traMADol  (ULTRAM ) 50 mg,  Oral, Every 8 hours PRN   Vitamin D3 5,000 Units, Oral, Daily   Xifaxan  550 mg, Oral, 3 times daily

## 2023-05-23 LAB — LAMOTRIGINE LEVEL: Lamotrigine Lvl: <1 ug/mL — ABNORMAL LOW (ref 2.0–20.0)

## 2023-05-25 ENCOUNTER — Encounter: Admitting: Psychology

## 2023-05-25 NOTE — Progress Notes (Deleted)
 [facility] Counselor/Therapist Progress Note  Patient ID: Sharon Bryan, MRN: [id number]   Date: 05/25/23  Time Spent: 8:06 am - 8:58 am :  52 Minutes   Treatment Type: Individual Therapy.  Reported Symptoms: Depression and anxiety.   Mental Status Exam: Appearance:  Casual     Behavior: Appropriate  Motor: Normal  Speech/Language:  Clear and Coherent  Affect: Congruent  Mood: dysthymic  Thought process: normal  Thought content:   WNL  Sensory/Perceptual disturbances:   WNL  Orientation: oriented to person, place, time/date, and situation  Attention: Good  Concentration: Good  Memory: WNL  Fund of knowledge:  Good  Insight:   Good  Judgment:  Good  Impulse Control: Good   Risk Assessment: Danger to Self:  No Self-injurious Behavior: No Danger to Others: No Duty to Warn:no Physical Aggression / Violence:No  Access to Firearms a concern: No  Gang Involvement:No   In case of a mental health emergency:  18 - confidential suicide hotline. [facility]        [address], [place] [address]       [phone].   911  4.   Visiting Nearest ED.    Subjective:   Sharon Bryan participated from home, via video and consented to treatment. Therapist participated from office. Sharon Bryan discussed the events of the past week. She noted feeling more psychologically and physically anxious. She endorsed "nervous sweating", tingling, and feeling on-edge. She noted her attempts to meditate but noted this being unsuccessful. She noted working on identifying possible contributing factors for her anxiety but could not identify a reason or cause. We worked on exploring this during the session. She noted continued stress due to relationship stressors. We worked on exploring this during the session. She noted difficulty navigating communication with her partner. She discussed that that have restarted couple's  counseling but noted the most recent session being stressful. We reviewed communication, assertiveness, and conflict resolution. Therapist modeled this during the session. She noted needing knee surgery but noted that she would have to lose 50# prior to surgery. She noted being hopeful that this upcoming surgery will reduce her pain and improve her overall mobility. Therapist validated Sharon Bryan's feelings and experience and provided supportive therapy. A follow-up was scheduled for continued treatment, which she benefits from.   Interventions: interpersonal & CBT  Diagnosis:  No diagnosis found.  Psychiatric Treatment: No , pending sign-off.   Treatment Plan:  Client Abilities/Strengths Sharon Bryan is self-aware and motivated for change.   Support System: Family and friends.  Client Treatment Preferences OPT and continued med management.   Client Statement of Needs Sharon Bryan would like to be autonomous, work on being more expressive, work on being assertive, managing symptoms, processing past events, boundary setting, managing interpersonal stressors, practice mindfulness, and manage distress day-to-day.   Treatment Level Weekly  Symptoms  Depression: loss of interest, feeling down, trouble with sleep (insomnia), lethargy, fluctuating appetite (primarily low appetite), feeling bad about self, difficulty concentrating.    (Status: maintained)  Anxiety: Feeling anxious, difficulty managing worry, worrying too much, trouble relaxing, restlessness, irritability, and feeling afraid something awful might happen.    (Status: maintained)  Goals:   Sharon Bryan experiences symptoms of depression and anxiety.   Treatment plan signed and available on s-drive:  No, pending signature.    Target Date: 03/14/2024 Frequency: Weekly  Progress: 0 Modality: individual    Therapist will provide referrals for additional resources as appropriate.  Therapist will provide psycho-education regarding  Sharon Bryan's diagnosis and corresponding treatment approaches and interventions. Sharon Boyden, Sharon Bryan will support the patient's ability to achieve the goals identified. will employ CBT, BA, Problem-solving, Solution Focused, Mindfulness,  coping skills, & other evidenced-based practices will be used to promote progress towards healthy functioning to help manage decrease symptoms associated with her diagnosis.   Reduce overall level, frequency, and intensity of the feelings of depression, anxiety and panic evidenced by decreased overall symptoms from 6 to 7 days/week to 0 to 1 days/week per client report for at least 3 consecutive months. Verbally express understanding of the relationship between feelings of depression, anxiety and their impact on thinking patterns and behaviors. Verbalize an understanding of the role that distorted thinking plays in creating fears, excessive worry, and ruminations.    Devra Fontana participated in the creation of the treatment plan)   Sharon Boyden, Sharon Bryan

## 2023-05-25 NOTE — Progress Notes (Signed)
 This encounter was created in error - please disregard.

## 2023-06-02 ENCOUNTER — Ambulatory Visit: Admitting: Psychology

## 2023-06-07 ENCOUNTER — Other Ambulatory Visit: Payer: Self-pay | Admitting: Family Medicine

## 2023-06-07 DIAGNOSIS — F411 Generalized anxiety disorder: Secondary | ICD-10-CM

## 2023-06-09 ENCOUNTER — Ambulatory Visit: Admitting: Psychology

## 2023-06-12 ENCOUNTER — Encounter: Admitting: Obstetrics and Gynecology

## 2023-06-14 ENCOUNTER — Other Ambulatory Visit: Payer: Self-pay | Admitting: Family Medicine

## 2023-06-14 DIAGNOSIS — R232 Flushing: Secondary | ICD-10-CM

## 2023-07-02 ENCOUNTER — Encounter: Payer: Self-pay | Admitting: Family Medicine

## 2023-07-02 DIAGNOSIS — K580 Irritable bowel syndrome with diarrhea: Secondary | ICD-10-CM

## 2023-07-03 ENCOUNTER — Other Ambulatory Visit: Payer: Self-pay | Admitting: Family Medicine

## 2023-07-03 DIAGNOSIS — M7041 Prepatellar bursitis, right knee: Secondary | ICD-10-CM

## 2023-07-04 DIAGNOSIS — K580 Irritable bowel syndrome with diarrhea: Secondary | ICD-10-CM | POA: Insufficient documentation

## 2023-07-04 MED ORDER — RIFAXIMIN 550 MG PO TABS
550.0000 mg | ORAL_TABLET | Freq: Three times a day (TID) | ORAL | 0 refills | Status: AC
Start: 2023-07-04 — End: ?

## 2023-07-10 ENCOUNTER — Encounter: Admitting: Obstetrics and Gynecology

## 2023-07-10 NOTE — Progress Notes (Deleted)
 PCP: Lamon Pillow, MD   No chief complaint on file.   HPI:      [name] is a 58 y.o. G3P3 whose LMP was Patient's last menstrual period was 04/26/2002 (lmp unknown)., presents today for her NP annual examination.  Her menses are absent due to TAH She {does:18564} have vasomotor sx. On 2 mg estradiol !!!  Sex activity: {sex active: 315163}. She {does:18564} have vaginal dryness.  Last Pap: {UVOZ:366440347}  Results were: {norm/abn:16707::no abnormalities} /neg HPV DNA.  Hx of STDs: {STD hx:14358}  Last mammogram: 04/07/22 Results were: normal--routine follow-up in 12 months There is no FH of breast cancer. There is no FH of ovarian cancer. The patient {does:18564} do self-breast exams.  Colonoscopy: 2019 at [facility]; Repeat due after 10*** years.   Tobacco use: {tob:20664} Alcohol use: {Alcohol:11675} No drug use Exercise: {exercise:31265}  She {does:18564} get adequate calcium and Vitamin D  in her diet.  Labs with PCP.   Patient Active Problem List   Diagnosis Date Noted   Irritable bowel syndrome with diarrhea 07/04/2023   Breakthrough seizure (HCC) 05/22/2023   OSA (obstructive sleep apnea) 11/29/2022   Vitamin D  deficiency 07/27/2022   Depression 01/03/2021   Change in facial mole 06/11/2019   Encounter for screening colonoscopy    Dyspnea 03/17/2015   Hyperkeratosis 02/13/2015   Dizziness 08/04/2014   Dysphasia 08/04/2014   GERD (gastroesophageal reflux disease) 08/04/2014   Goiter 08/04/2014   Arthralgia of hip 08/04/2014   Hot flashes 08/04/2014   Menopause 08/04/2014   Migraine 08/04/2014   Numbness and tingling of right arm and leg 08/04/2014   Morbid obesity (HCC) 08/04/2014   Fibromyalgia 07/18/2014   Allergic rhinitis 07/18/2014   Thyroid  cyst 10/27/2010   Malaise and fatigue 12/17/2008   Anemia, iron deficiency 09/02/2008   Anxiety disorder 09/02/2008   Depressive disorder 09/02/2008   Seizure disorder (HCC) 07/08/2008    Epilepsy (HCC) 11/10/2003   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 Blanca Bunch. Partial, ovaries remain   CESAREAN SECTION  01/25/1996   COLONOSCOPY WITH PROPOFOL  N/A 11/07/2017   Procedure: COLONOSCOPY WITH PROPOFOL ;  Surgeon: Selena Daily, MD;  Location: [facility] ENDOSCOPY;  Service: Gastroenterology;  Laterality: N/A;   CRANIOTOMY  01/25/2003   for artetiovenius malformation   TUBAL LIGATION  01/25/1996    Family History  Problem Relation Age of Onset   Hypertension Mother    Arthritis Mother    Breast cancer Sister 56   Breast cancer Other 30       neice   Breast cancer Other        mcousin    Social History   Socioeconomic History   Marital status: Divorced    Spouse name: Not on file   Number of children: Not on file   Years of education: Not on file   Highest education level: Not on file  Occupational History   Not on file  Tobacco Use   Smoking status: Every Day    Current packs/day: 0.00    Types: Cigarettes    Last attempt to quit: 01/25/2007    Years since quitting: 16.4   Smokeless tobacco: Never   Tobacco comments:    Smoked for about 15 years, smoked about 1 pack a day.  Vaping Use   Vaping status: Never Used  Substance and Sexual Activity   Alcohol use: Yes  Comment: occasional use; 1-2 times a year   Drug use: No   Sexual activity: Not on file  Other Topics Concern   Not on file  Social History Narrative   Not on file   Social Drivers of Health   Financial Resource Strain: Low Risk  (04/18/2022)   Overall Financial Resource Strain (CARDIA)    Difficulty of Paying Living Expenses: Not hard at all  Food Insecurity: No Food Insecurity (04/18/2022)   Hunger Vital Sign    Worried About Running Out of Food in the Last Year: Never true    Ran Out of Food in the Last Year: Never true  Transportation Needs: No Transportation Needs (04/18/2022)    PRAPARE - Administrator, Civil Service (Medical): No    Lack of Transportation (Non-Medical): No  Physical Activity: Insufficiently Active (04/18/2022)   Exercise Vital Sign    Days of Exercise per Week: 2 days    Minutes of Exercise per Session: 20 min  Stress: No Stress Concern Present (04/18/2022)   [employer] of Occupational Health - Occupational Stress Questionnaire    Feeling of Stress : Not at all  Social Connections: Moderately Isolated (04/18/2022)   Social Connection and Isolation Panel    Frequency of Communication with Friends and Family: More than three times a week    Frequency of Social Gatherings with Friends and Family: Twice a week    Attends Religious Services: Never    Database administrator or Organizations: No    Attends Banker Meetings: Never    Marital Status: Living with partner  Intimate Partner Violence: Not At Risk (04/18/2022)   Humiliation, Afraid, Rape, and Kick questionnaire    Fear of Current or Ex-Partner: No    Emotionally Abused: No    Physically Abused: No    Sexually Abused: No     Current Outpatient Medications:    Aspirin-Acetaminophen -Caffeine (EXCEDRIN PO), Take 1 tablet daily as needed by mouth., Disp: , Rfl:    busPIRone (BUSPAR) 30 MG tablet, TAKE 1 TABLET BY MOUTH TWICE DAILY, Disp: 180 tablet, Rfl: 4   Cholecalciferol (VITAMIN D3) 125 MCG (5000 UT) CAPS, Take 1 capsule (5,000 Units total) by mouth daily., Disp: 30 capsule, Rfl:    diazepam  (VALIUM ) 5 MG tablet, TAKE 1 TABLET BY MOUTH EVERY 8 HOURS AS NEEDED FOR ANXIETY, Disp: 90 tablet, Rfl: 1   DULoxetine  (CYMBALTA ) 60 MG capsule, TAKE 1 CAPSULE(60 MG) BY MOUTH DAILY, Disp: 90 capsule, Rfl: 0   estradiol  (ESTRACE ) 2 MG tablet, TAKE 1 TABLET(2 MG) BY MOUTH DAILY, Disp: 90 tablet, Rfl: 3   fluticasone  (FLONASE ) 50 MCG/ACT nasal spray, Place 2 sprays into both nostrils daily. (Patient not taking: Reported on 05/22/2023), Disp: 16 g, Rfl: 6   lamoTRIgine   (LAMICTAL ) 100 MG tablet, TAKE 1 TABLET BY MOUTH TWICE DAILY, Disp: 180 tablet, Rfl: 4   levothyroxine  (SYNTHROID ) 75 MCG tablet, TAKE 1 TABLET(75 MCG) BY MOUTH DAILY, Disp: 90 tablet, Rfl: 0   Magnesium Oxide 420 MG TABS, Take 400 mg by mouth daily., Disp: , Rfl:    modafinil  (PROVIGIL ) 200 MG tablet, Take 2 tablets (400 mg total) by mouth every morning., Disp: 60 tablet, Rfl: 3   Multiple Vitamins-Minerals (CENTRUM SILVER PO), Take by mouth., Disp: , Rfl:    nabumetone  (RELAFEN ) 750 MG tablet, TAKE 1 TO 2 TABLETS(750 TO 1500 MG) BY MOUTH DAILY AS NEEDED FOR LEG AND KNEE PAIN, Disp: 30 tablet, Rfl: 2   neomycin -polymyxin-hydrocortisone (  CORTISPORIN) 3.5-10000-1 OTIC suspension, SHAKE LIQUID AND INSTILL 3 TO 4 DROPS TO AFFECTED EAR FOUR TIMES DAILY, Disp: 10 mL, Rfl: 3   nortriptyline  (PAMELOR ) 10 MG capsule, TAKE 1 TO 4 CAPSULES(10 TO 40 MG) BY MOUTH AT BEDTIME, Disp: 100 capsule, Rfl: 4   omeprazole (PRILOSEC) 20 MG capsule, TAKE 1 CAPSULE BY MOUTH EVERY DAY, Disp: 90 capsule, Rfl: 3   PARoxetine  (PAXIL ) 30 MG tablet, TAKE 2 TABLETS(60 MG) BY MOUTH DAILY, Disp: 180 tablet, Rfl: 4   phenytoin  (DILANTIN ) 100 MG ER capsule, Take 2 capsules (200 mg total) by mouth every morning AND 3 capsules (300 mg total) every evening., Disp: 450 capsule, Rfl: 3   pregabalin  (LYRICA ) 225 MG capsule, TAKE 1 CAPSULE BY MOUTH TWICE DAILY, Disp: 180 capsule, Rfl: 1   promethazine -dextromethorphan (PROMETHAZINE -DM) 6.25-15 MG/5ML syrup, Take 5 mLs by mouth 4 (four) times daily as needed for cough., Disp: 118 mL, Rfl: 0   rifaximin  (XIFAXAN ) 550 MG TABS tablet, Take 1 tablet (550 mg total) by mouth 3 (three) times daily., Disp: 30 tablet, Rfl: 0   traMADol  (ULTRAM ) 50 MG tablet, Take 1 tablet (50 mg total) by mouth every 8 (eight) hours as needed. (Patient not taking: Reported on 05/22/2023), Disp: 30 tablet, Rfl: 3     ROS:  Review of Systems BREAST: No symptoms    Objective: LMP 04/26/2002 (LMP Unknown)     OBGyn Exam  Results: No results found for this or any previous visit (from the past 24 hours).  Assessment/Plan:  No diagnosis found.   No orders of the defined types were placed in this encounter.           GYN counsel {counseling: 16159}    F/U  No follow-ups on file.  Xzavion Doswell B. Kayleanna Lorman, PA-C 07/10/2023 11:03 AM

## 2023-07-12 ENCOUNTER — Ambulatory Visit: Admitting: Family Medicine

## 2023-07-13 IMAGING — CR DG KNEE COMPLETE 4+V*R*
1 series · 5 of 5 positions shown · non-contrast
Comparison: None Available.

CLINICAL DATA: Right knee strain. Right knee pain for 1 month.
Medial pain now extending to the lateral aspect.

EXAM:
RIGHT KNEE - COMPLETE 4+ VIEW

[Series 1: dg knee complete 4 views right · 0.14mm/px · 5 of 5 slices shown]
[im 1/5]
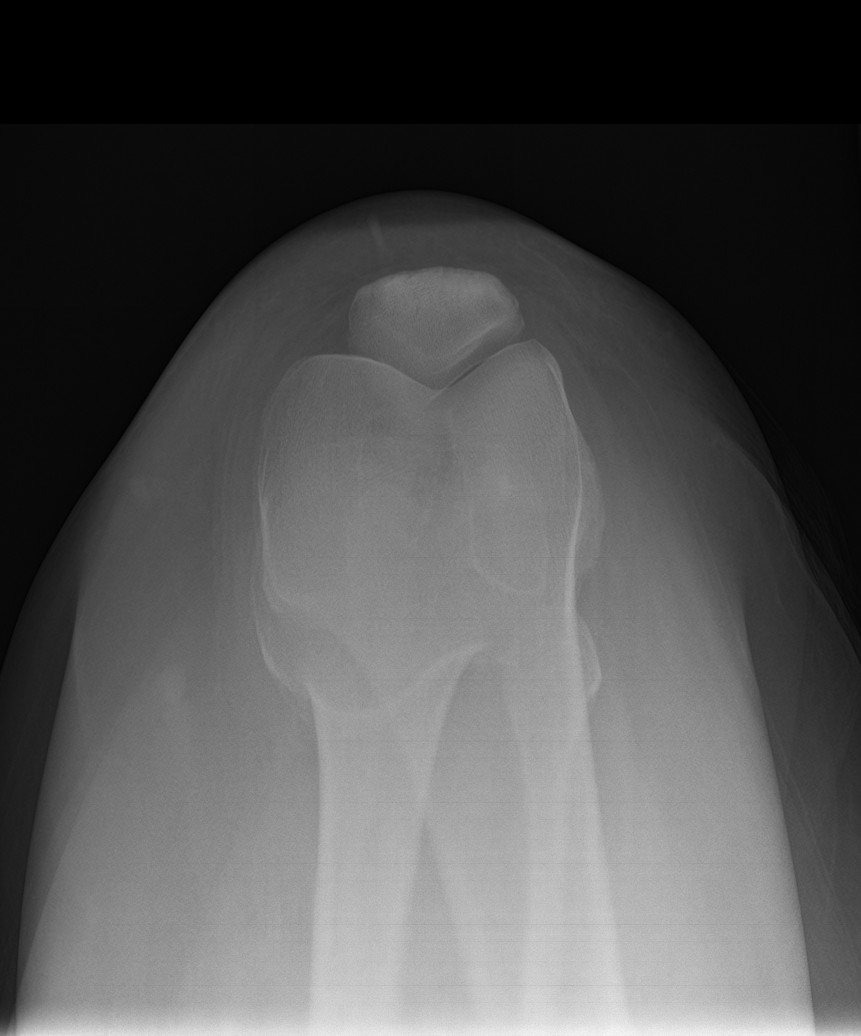
[im 2/5]
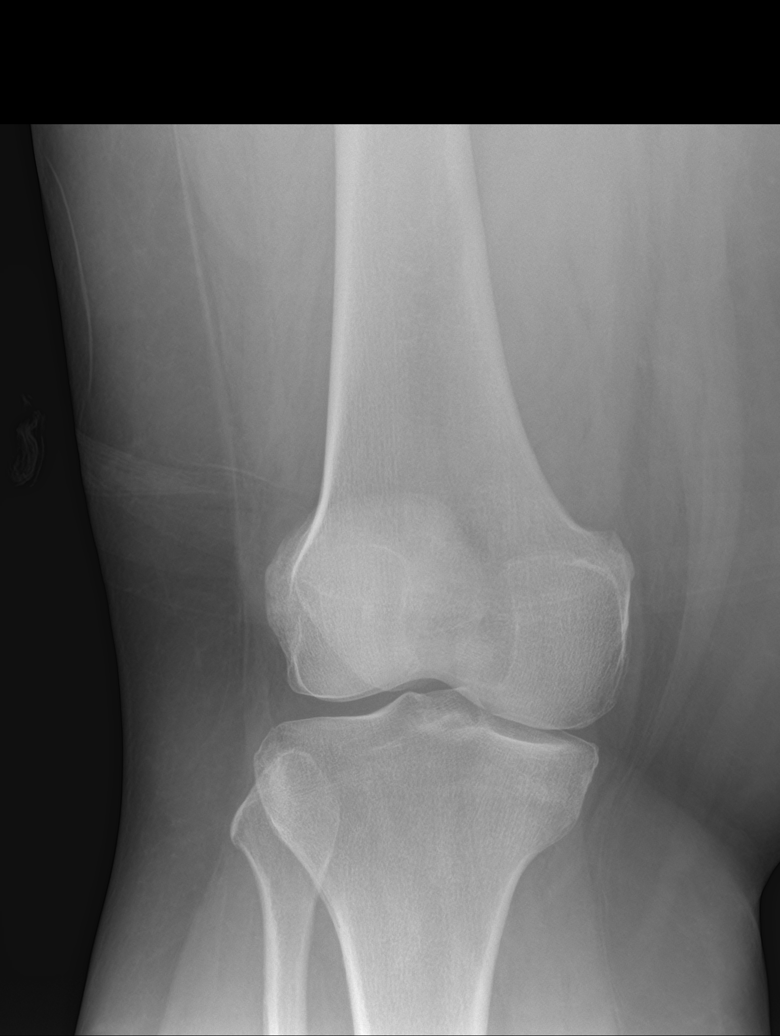
[im 3/5]
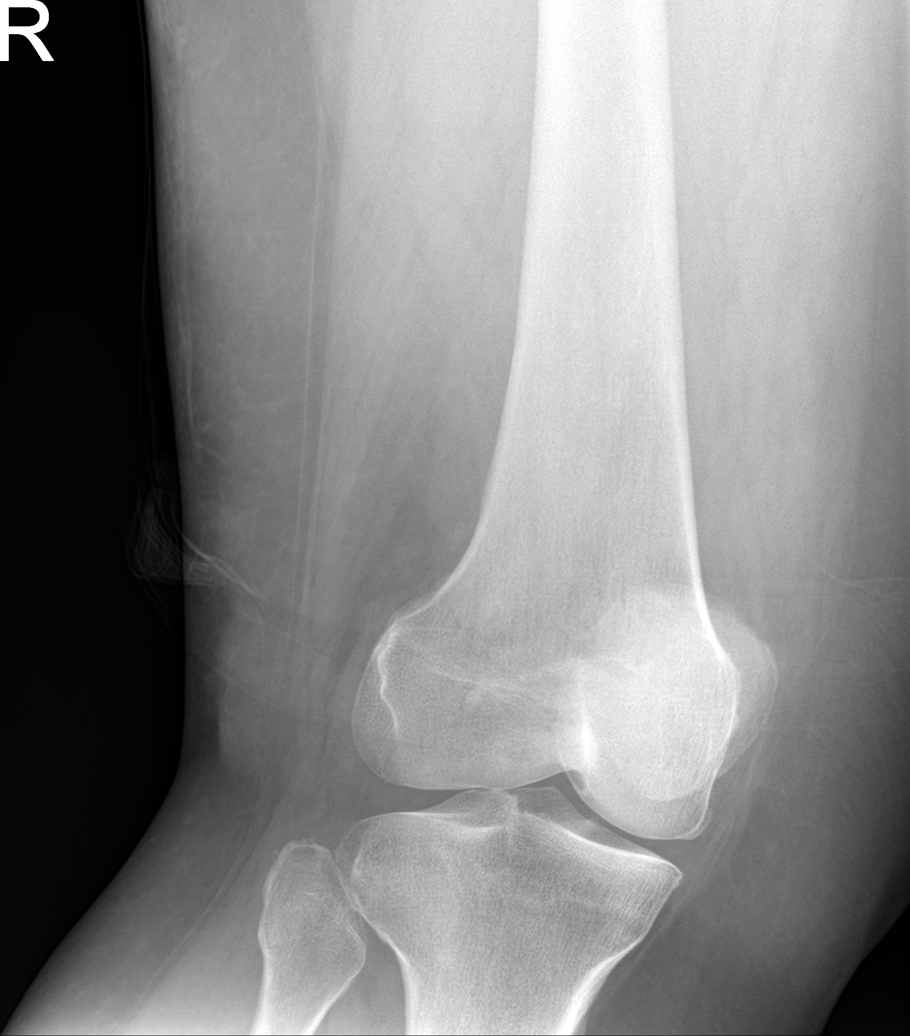
[im 4/5]
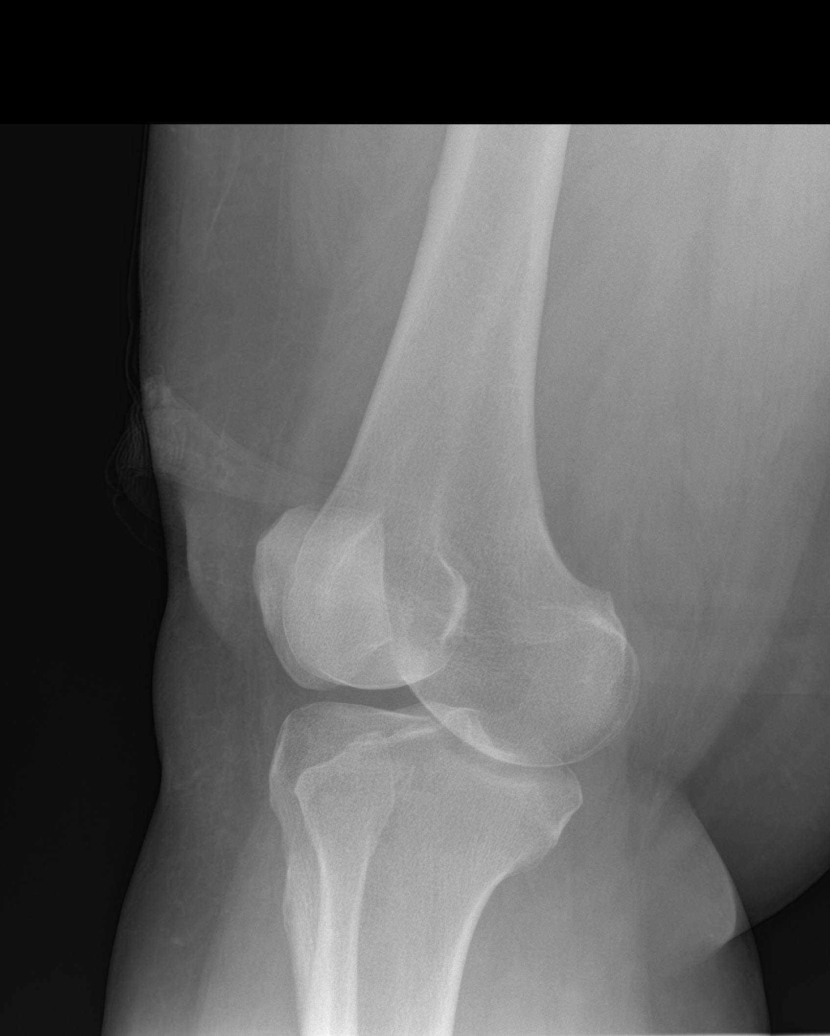
[im 5/5]
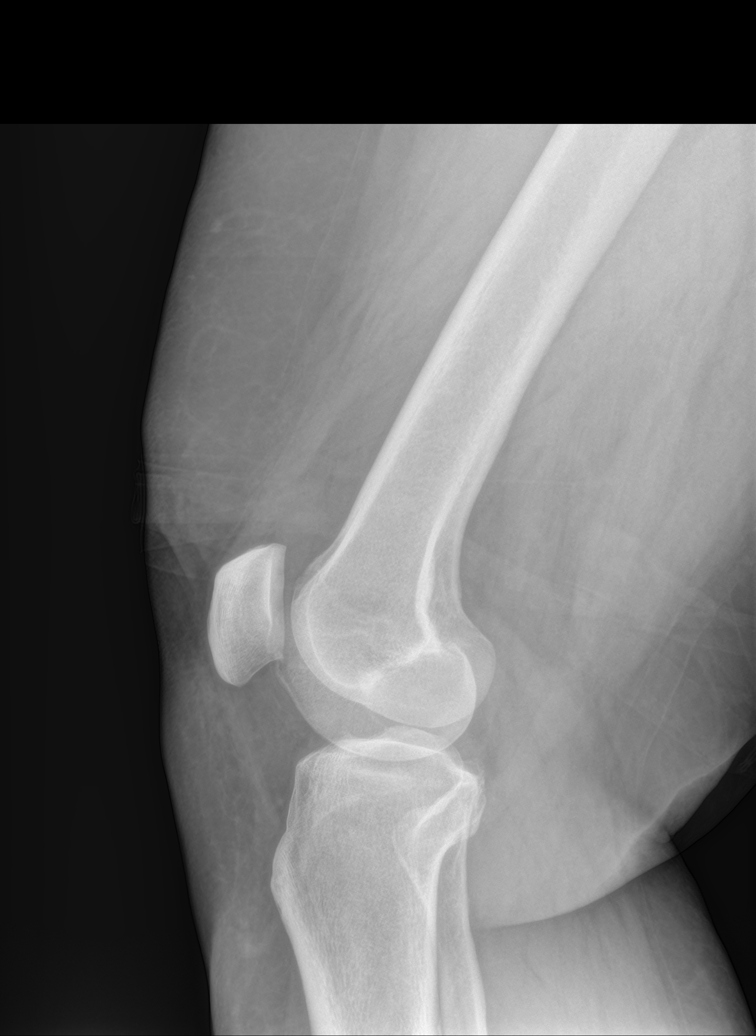

[5 of 5 positions shown; findings below may reference images not displayed]

FINDINGS: Mild medial compartment joint space narrowing and peripheral
osteophytosis. Tiny knee joint effusion. The lateral and
patellofemoral joint spaces are maintained. No acute fracture or
dislocation.
IMPRESSION: Minimal medial compartment osteoarthritis.

## 2023-07-17 ENCOUNTER — Encounter: Payer: Self-pay | Admitting: Obstetrics and Gynecology

## 2023-07-21 ENCOUNTER — Other Ambulatory Visit: Payer: Self-pay | Admitting: Family Medicine

## 2023-07-21 DIAGNOSIS — H9209 Otalgia, unspecified ear: Secondary | ICD-10-CM

## 2023-07-31 ENCOUNTER — Ambulatory Visit: Admitting: Family Medicine

## 2023-08-06 DIAGNOSIS — Z789 Other specified health status: Secondary | ICD-10-CM | POA: Insufficient documentation

## 2023-08-06 DIAGNOSIS — Z79899 Other long term (current) drug therapy: Secondary | ICD-10-CM | POA: Insufficient documentation

## 2023-08-06 DIAGNOSIS — G894 Chronic pain syndrome: Secondary | ICD-10-CM | POA: Insufficient documentation

## 2023-08-06 DIAGNOSIS — M899 Disorder of bone, unspecified: Secondary | ICD-10-CM | POA: Insufficient documentation

## 2023-08-06 NOTE — Patient Instructions (Signed)

## 2023-08-06 NOTE — Progress Notes (Unsigned)
 PROVIDER NOTE: Interpretation of information contained herein should be left to medically-trained personnel. Specific patient instructions are provided elsewhere under Patient Instructions section of medical record. This document was created in part using AI and STT-dictation technology, any transcriptional errors that may result from this process are unintentional.  Patient: Sharon Bryan  Service: E/M Encounter  Provider: Eric DELENA Como, MD  DOB: [birth year]  Delivery: Face-to-face  Specialty: Interventional Pain Management  MRN: [id number]  Setting: Ambulatory outpatient facility  Specialty designation: 09  Type: New Patient  Location: Outpatient office facility  PCP: Gasper Nancyann BRAVO, MD  DOS: 08/07/2023    Referring Prov.: Lane Arthea BRAVO, MD   Primary Reason(s) for Visit: Encounter for initial evaluation of one or more chronic problems (new to examiner) potentially causing chronic pain, and posing a threat to normal musculoskeletal function. (Level of risk: High) CC: No chief complaint on file.  HPI  [name] is a 58 y.o. year old, female patient, who comes for the first time to our practice referred by Potter, [name], MD for our initial evaluation of her chronic pain. She has Fibromyalgia; Allergic rhinitis; Anemia, iron deficiency; Anxiety disorder; AVM (arteriovenous malformation) brain; Seizure disorder (HCC); Depressive disorder; Dizziness; Dysphasia; Epilepsy (HCC); GERD (gastroesophageal reflux disease); Goiter; Arthralgia of hip; Hot flashes; Malaise and fatigue; Menopause; Migraine; Numbness and tingling of right arm and leg; Morbid obesity (HCC); Thyroid  cyst; Hyperkeratosis; Dyspnea; Encounter for screening colonoscopy; Change in facial mole; Depression; Vitamin D  deficiency; OSA (obstructive sleep apnea); Breakthrough seizure (HCC); Irritable bowel syndrome with diarrhea; Chronic pain syndrome; Pharmacologic therapy; Disorder of skeletal system; and Problems  influencing health status on their problem list. Today she comes in for evaluation of her No chief complaint on file.  Pain Assessment: Location:     Radiating:   Onset:   Duration:   Quality:   Severity:  /10 (subjective, self-reported pain score)  Effect on ADL:   Timing:   Modifying factors:   BP:    HR:    Onset and Duration: {Hx; Onset and Duration:210120511} Cause of pain: {Hx; Cause:210120521} Severity: {Pain Severity:210120502} Timing: {Symptoms; Timing:210120501} Aggravating Factors: {Causes; Aggravating pain factors:210120507} Alleviating Factors: {Causes; Alleviating Factors:210120500} Associated Problems: {Hx; Associated problems:210120515} Quality of Pain: {Hx; Symptom quality or Descriptor:210120531} Previous Examinations or Tests: {Hx; Previous examinations or test:210120529} Previous Treatments: {Hx; Previous Treatment:210120503}  [name] is being evaluated for possible interventional pain management therapies for the treatment of her chronic pain.  Discussed the use of AI scribe software for clinical note transcription with the patient, who gave verbal consent to proceed.  History of Present Illness           ***  [name] has been informed that this initial visit was an evaluation only.  On the follow up appointment I will go over the results, including ordered tests and available interventional therapies. At that time she will have the opportunity to decide whether to proceed with offered therapies or not. In the event that [name] prefers avoiding interventional options, this will conclude our involvement in the case.  Medication management recommendations may be provided upon request.  Patient informed that diagnostic tests may be ordered to assist in identifying underlying causes, narrow the list of differential diagnoses and aid in determining candidacy for (or contraindications to) planned therapeutic interventions.  Historic  Controlled Substance Pharmacotherapy Review PMP and historical list of controlled substances: Diazepam  5 Mg Tablet ;Pregabalin  225 Mg Capsule ;Tramadol  Hcl 50 Mg Tablet  ;Modafinil  200  Mg Tablet  Most recently prescribed controlled substance(s): Opioid Analgesic: *** MME/day: *** mg/day  Historical Monitoring: The patient  reports no history of drug use. List of prior UDS Testing: Lab Results  Component Value Date   [facility] <15 05/22/2023   Historical Background Evaluation: [place] PMP: PDMP reviewed during this encounter. Review of the past 48-months conducted.             PMP NARX Score Report:  Narcotic: 340 Sedative: 482 Stimulant: 100 [place] Department of public safety, offender search: Engineer, mining Information) Non-contributory Risk Assessment Profile: Aberrant behavior: None observed or detected today Risk factors for fatal opioid overdose: None identified today PMP NARX Overdose Risk Score: 300 Fatal overdose hazard ratio (HR): Calculation deferred Non-fatal overdose hazard ratio (HR): Calculation deferred Risk of opioid abuse or dependence: 0.7-3.0% with doses <= 36 MME/day and 6.1-26% with doses >= 120 MME/day. Substance use disorder (SUD) risk level: See below Personal History of Substance Abuse (SUD-Substance use disorder):  Alcohol:    Illegal Drugs:    Rx Drugs:    ORT Risk Level calculation:    ORT Scoring interpretation table:  Score <3 = Low Risk for SUD  Score between 4-7 = Moderate Risk for SUD  Score >8 = High Risk for Opioid Abuse   PHQ-2 Depression Scale:  Total score:    PHQ-2 Scoring interpretation table: (Score and probability of major depressive disorder)  Score 0 = No depression  Score 1 = 15.4% Probability  Score 2 = 21.1% Probability  Score 3 = 38.4% Probability  Score 4 = 45.5% Probability  Score 5 = 56.4% Probability  Score 6 = 78.6% Probability   PHQ-9 Depression Scale:  Total score:    PHQ-9 Scoring interpretation table:  Score 0-4 = No depression   Score 5-9 = Mild depression  Score 10-14 = Moderate depression  Score 15-19 = Moderately severe depression  Score 20-27 = Severe depression (2.4 times higher risk of SUD and 2.89 times higher risk of overuse)   Pharmacologic Plan: As per protocol, I have not taken over any controlled substance management, pending the results of ordered tests and/or consults.            Initial impression: Pending review of available data and ordered tests.  Meds   Current Outpatient Medications:    Aspirin-Acetaminophen -Caffeine (EXCEDRIN PO), Take 1 tablet daily as needed by mouth., Disp: , Rfl:    busPIRone (BUSPAR) 30 MG tablet, TAKE 1 TABLET BY MOUTH TWICE DAILY, Disp: 180 tablet, Rfl: 4   Cholecalciferol (VITAMIN D3) 125 MCG (5000 UT) CAPS, Take 1 capsule (5,000 Units total) by mouth daily., Disp: 30 capsule, Rfl:    diazepam  (VALIUM ) 5 MG tablet, TAKE 1 TABLET BY MOUTH EVERY 8 HOURS AS NEEDED FOR ANXIETY, Disp: 90 tablet, Rfl: 1   DULoxetine  (CYMBALTA ) 60 MG capsule, TAKE 1 CAPSULE(60 MG) BY MOUTH DAILY, Disp: 90 capsule, Rfl: 0   estradiol  (ESTRACE ) 2 MG tablet, TAKE 1 TABLET(2 MG) BY MOUTH DAILY, Disp: 90 tablet, Rfl: 3   fluticasone  (FLONASE ) 50 MCG/ACT nasal spray, Place 2 sprays into both nostrils daily. (Patient not taking: Reported on 05/22/2023), Disp: 16 g, Rfl: 6   lamoTRIgine  (LAMICTAL ) 100 MG tablet, TAKE 1 TABLET BY MOUTH TWICE DAILY, Disp: 180 tablet, Rfl: 4   levothyroxine  (SYNTHROID ) 75 MCG tablet, TAKE 1 TABLET(75 MCG) BY MOUTH DAILY, Disp: 90 tablet, Rfl: 0   Magnesium Oxide 420 MG TABS, Take 400 mg by mouth daily., Disp: , Rfl:  modafinil  (PROVIGIL ) 200 MG tablet, Take 2 tablets (400 mg total) by mouth every morning., Disp: 60 tablet, Rfl: 3   Multiple Vitamins-Minerals (CENTRUM SILVER PO), Take by mouth., Disp: , Rfl:    nabumetone  (RELAFEN ) 750 MG tablet, TAKE 1 TO 2 TABLETS(750 TO 1500 MG) BY MOUTH DAILY AS NEEDED FOR LEG AND KNEE PAIN, Disp: 30 tablet, Rfl: 2    neomycin -polymyxin-hydrocortisone (CORTISPORIN) 3.5-10000-1 OTIC suspension, SHAKE LIQUID AND INSTILL 3 TO 4 DROPS TO AFFECTED EAR FOUR TIMES DAILY, Disp: 10 mL, Rfl: 3   nortriptyline  (PAMELOR ) 10 MG capsule, TAKE 1 TO 4 CAPSULES(10 TO 40 MG) BY MOUTH AT BEDTIME, Disp: 100 capsule, Rfl: 4   omeprazole (PRILOSEC) 20 MG capsule, TAKE 1 CAPSULE BY MOUTH EVERY DAY, Disp: 90 capsule, Rfl: 3   PARoxetine  (PAXIL ) 30 MG tablet, TAKE 2 TABLETS(60 MG) BY MOUTH DAILY, Disp: 180 tablet, Rfl: 4   phenytoin  (DILANTIN ) 100 MG ER capsule, Take 2 capsules (200 mg total) by mouth every morning AND 3 capsules (300 mg total) every evening., Disp: 450 capsule, Rfl: 3   pregabalin  (LYRICA ) 225 MG capsule, TAKE 1 CAPSULE BY MOUTH TWICE DAILY, Disp: 180 capsule, Rfl: 1   promethazine -dextromethorphan (PROMETHAZINE -DM) 6.25-15 MG/5ML syrup, Take 5 mLs by mouth 4 (four) times daily as needed for cough., Disp: 118 mL, Rfl: 0   rifaximin  (XIFAXAN ) 550 MG TABS tablet, Take 1 tablet (550 mg total) by mouth 3 (three) times daily., Disp: 30 tablet, Rfl: 0   traMADol  (ULTRAM ) 50 MG tablet, Take 1 tablet (50 mg total) by mouth every 8 (eight) hours as needed. (Patient not taking: Reported on 05/22/2023), Disp: 30 tablet, Rfl: 3  Imaging Review  Knee Imaging: Knee-R DG 4 views: Results for orders placed during the hospital encounter of 06/18/21 DG Knee Complete 4 Views Right  Narrative CLINICAL DATA:  Right knee strain. Right knee pain for 1 month. Medial pain now extending to the lateral aspect.  EXAM: RIGHT KNEE - COMPLETE 4+ VIEW  COMPARISON:  None Available.  FINDINGS: Mild medial compartment joint space narrowing and peripheral osteophytosis. Tiny knee joint effusion. The lateral and patellofemoral joint spaces are maintained. No acute fracture or dislocation.  IMPRESSION: Minimal medial compartment osteoarthritis.   Electronically Signed By: Tanda Lyons M.D. On: 06/20/2021 12:52  Complexity Note: Imaging  results reviewed.                         ROS  Cardiovascular: {Hx; Cardiovascular History:210120525} Pulmonary or Respiratory: {Hx; Pumonary and/or Respiratory History:210120523} Neurological: {Hx; Neurological:210120504} Psychological-Psychiatric: {Hx; Psychological-Psychiatric History:210120512} Gastrointestinal: {Hx; Gastrointestinal:210120527} Genitourinary: {Hx; Genitourinary:210120506} Hematological: {Hx; Hematological:210120510} Endocrine: {Hx; Endocrine history:210120509} Rheumatologic: {Hx; Rheumatological:210120530} Musculoskeletal: {Hx; Musculoskeletal:210120528} Work History: {Hx; Work history:210120514}  Allergies  Ms. Shuford is allergic to codeine and latex.  Laboratory Chemistry Profile   Renal Lab Results  Component Value Date   BUN 17 05/22/2023   CREATININE 0.72 05/22/2023   BCR 15 06/03/2022   GFRAA 117 02/25/2020   GFRNONAA >60 05/22/2023   SPECGRAV 1.010 10/19/2021   PHUR 5.0 10/19/2021   PROTEINUR Positive (A) 10/19/2021     Electrolytes Lab Results  Component Value Date   NA 137 05/22/2023   K 4.1 05/22/2023   CL 102 05/22/2023   CALCIUM 9.5 05/22/2023   MG 2.2 07/18/2022     Hepatic Lab Results  Component Value Date   AST 17 06/03/2022   ALT 20 06/03/2022   ALBUMIN 4.2 06/03/2022   ALKPHOS 73 06/03/2022  ID No results found for: LYMEIGGIGMAB, HIV, SARSCOV2NAA, STAPHAUREUS, MRSAPCR, HCVAB, PREGTESTUR, RMSFIGG, QFVRPH1IGG, QFVRPH2IGG   Bone Lab Results  Component Value Date   VD25OH 32.8 07/18/2022     Endocrine Lab Results  Component Value Date   GLUCOSE 92 05/22/2023   TSH 2.642 05/22/2023   FREET4 1.41 03/15/2022   UCORFRPERLTR 18 08/02/2022   UCORFRPERDAY 18.1 12/05/2022     Neuropathy No results found for: VITAMINB12, FOLATE, HGBA1C, HIV   CNS No results found for: COLORCSF, APPEARCSF, RBCCOUNTCSF, WBCCSF, POLYSCSF, LYMPHSCSF, EOSCSF, PROTEINCSF, GLUCCSF, JCVIRUS,  CSFOLI, IGGCSF, LABACHR, ACETBL   Inflammation (CRP: Acute  ESR: Chronic) No results found for: CRP, ESRSEDRATE, LATICACIDVEN   Rheumatology No results found for: RF, ANA, LABURIC, URICUR, LYMEIGGIGMAB, LYMEABIGMQN, HLAB27   Coagulation Lab Results  Component Value Date   INR 1.0 05/22/2023   LABPROT 13.3 05/22/2023   APTT 27 05/22/2023   PLT 279 05/22/2023     Cardiovascular Lab Results  Component Value Date   CKTOTAL 170 09/19/2012   CKMB 1.2 09/19/2012   TROPONINI < 0.02 09/19/2012   HGB 13.7 05/22/2023   HCT 39.5 05/22/2023     Screening No results found for: SARSCOV2NAA, COVIDSOURCE, STAPHAUREUS, MRSAPCR, HCVAB, HIV, PREGTESTUR   Cancer No results found for: CEA, CA125, LABCA2   Allergens No results found for: ALMOND, APPLE, ASPARAGUS, AVOCADO, BANANA, BARLEY, BASIL, BAYLEAF, GREENBEAN, LIMABEAN, WHITEBEAN, BEEFIGE, REDBEET, BLUEBERRY, BROCCOLI, CABBAGE, MELON, CARROT, CASEIN, CASHEWNUT, CAULIFLOWER, CELERY     Note: Lab results reviewed.  PFSH  Drug: Ms. Kanode  reports no history of drug use. Alcohol:  reports current alcohol use. Tobacco:  reports that she has been smoking cigarettes. She has never used smokeless tobacco. Medical:  has a past medical history of Allergy, Anemia, iron deficiency (09/02/2008), AVM (arteriovenous malformation) brain (07/09/2003), Bell's palsy, Fibromyalgia, Menopause (2015), Migraine, Numbness and tingling of right arm and leg, Sciatica (08/04/2014), Seizure disorder (HCC), Short-term memory loss, and Thyroid  cyst (10/27/2010). Family: family history includes Arthritis in her mother; Breast cancer in an other family member; Breast cancer (age of onset: 53) in an other family member; Breast cancer (age of onset: 62) in her sister; Hypertension in her mother.  Past Surgical History:  Procedure Laterality Date   ABDOMINAL HYSTERECTOMY  2004    Dr. Roni. Partial, ovaries remain   CESAREAN SECTION  01/25/1996   COLONOSCOPY WITH PROPOFOL  N/A 11/07/2017   Procedure: COLONOSCOPY WITH PROPOFOL ;  Surgeon: Unk Corinn Skiff, MD;  [facility] END[facility];  Service: Gastroenterology;  Laterality: N/A;   CRANIOTOMY  01/25/2003   for artetiovenius malformation   TUBAL LIGATION  01/25/1996   Active Ambulatory Problems    Diagnosis Date Noted   Fibromyalgia 07/18/2014   Allergic rhinitis 07/18/2014   Anemia, iron deficiency 09/02/2008   Anxiety disorder 09/02/2008   AVM (arteriovenous malformation) brain 07/09/2003   Seizure disorder (HCC) 07/08/2008   Depressive disorder 09/02/2008   Dizziness 08/04/2014   Dysphasia 08/04/2014   Epilepsy (HCC) 11/10/2003   GERD (gastroesophageal reflux disease) 08/04/2014   Goiter 08/04/2014   Arthralgia of hip 08/04/2014   Hot flashes 08/04/2014   Malaise and fatigue 12/17/2008   Menopause 08/04/2014   Migraine 08/04/2014   Numbness and tingling of right arm and leg 08/04/2014   Morbid obesity (HCC) 08/04/2014   Thyroid  cyst 10/27/2010   Hyperkeratosis 02/13/2015   Dyspnea 03/17/2015   Encounter for screening colonoscopy    Change in facial mole 06/11/2019   Depression 01/03/2021   Vitamin D  deficiency 07/27/2022  OSA (obstructive sleep apnea) 11/29/2022   Breakthrough seizure (HCC) 05/22/2023   Irritable bowel syndrome with diarrhea 07/04/2023   Chronic pain syndrome 08/06/2023   Pharmacologic therapy 08/06/2023   Disorder of skeletal system 08/06/2023   Problems influencing health status 08/06/2023   Resolved Ambulatory Problems    Diagnosis Date Noted   Bell's palsy 08/04/2014   Cervical adenitis 08/04/2014   Difficult or painful urination 08/04/2014   Sciatica 08/04/2014   Encounter for screening for lipoid disorders 08/04/2014   Past Medical History:  Diagnosis Date   Allergy    Short-term memory loss    Constitutional Exam  General appearance: Well nourished,  well developed, and well hydrated. In no apparent acute distress There were no vitals filed for this visit. BMI Assessment: Estimated body mass index is 46.09 kg/m as calculated from the following:   Height as of 05/22/23: 5' 2 (1.575 m).   Weight as of 05/22/23: 252 lb (114.3 kg).  BMI interpretation table: BMI level Category Range association with higher incidence of chronic pain  <18 kg/m2 Underweight   18.5-24.9 kg/m2 Ideal body weight   25-29.9 kg/m2 Overweight Increased incidence by 20%  30-34.9 kg/m2 Obese (Class I) Increased incidence by 68%  35-39.9 kg/m2 Severe obesity (Class II) Increased incidence by 136%  >40 kg/m2 Extreme obesity (Class III) Increased incidence by 254%   Patient's current BMI Ideal Body weight  There is no height or weight on file to calculate BMI. Patient weight not recorded   BMI Readings from Last 4 Encounters:  05/22/23 46.09 kg/m  12/05/22 46.27 kg/m  10/04/22 46.86 kg/m  04/18/22 47.19 kg/m   Wt Readings from Last 4 Encounters:  05/22/23 252 lb (114.3 kg)  12/05/22 253 lb (114.8 kg)  10/04/22 256 lb 3.2 oz (116.2 kg)  04/18/22 258 lb (117 kg)    Psych/Mental status: Alert, oriented x 3 (person, place, & time)       Eyes: PERLA Respiratory: No evidence of acute respiratory distress  Assessment  Primary Diagnosis & Pertinent Problem List: The primary encounter diagnosis was Chronic pain syndrome. Diagnoses of Pharmacologic therapy, Disorder of skeletal system, and Problems influencing health status were also pertinent to this visit.  Visit Diagnosis (New problems to examiner): 1. Chronic pain syndrome   2. Pharmacologic therapy   3. Disorder of skeletal system   4. Problems influencing health status    Plan of Care (Initial workup plan)  Note: Ms. Urbach was reminded that as per protocol, today's visit has been an evaluation only. We have not taken over the patient's controlled substance management.  Problem-specific  plan: Assessment and Plan            Lab Orders  No laboratory test(s) ordered today   Imaging Orders  No imaging studies ordered today   Referral Orders  No referral(s) requested today   Procedure Orders    No procedure(s) ordered today   Pharmacotherapy (current): Medications ordered:  No orders of the defined types were placed in this encounter.  Medications administered during this visit: Margery Szostak. Caporaso had no medications administered during this visit.   Analgesic Pharmacotherapy:  Opioid Analgesics: For patients currently taking or requesting to take opioid analgesics, in accordance with [place]  Medical Board Guidelines, we will assess their risks and indications for the use of these substances. After completing our evaluation, we may offer recommendations, but we no longer take patients for medication management. The prescribing physician will ultimately decide, based on his/her training and  level of comfort whether to adopt any of the recommendations, including whether or not to prescribe such medicines.  Membrane stabilizer: To be determined at a later time  Muscle relaxant: To be determined at a later time  NSAID: To be determined at a later time  Other analgesic(s): To be determined at a later time   Interventional management options: Ms. Mcginness was informed that there is no guarantee that she would be a candidate for interventional therapies. The decision will be based on the results of diagnostic studies, as well as Ms. Kamm's risk profile.  Procedure(s) under consideration:  Pending results of ordered studies     Interventional Therapies  Risk Factors  Considerations  Medical Comorbidities:     Planned  Pending:      Under consideration:   Pending   Completed: (Analgesic benefit)1  None at this time   Therapeutic  Palliative (PRN) options:   None established   Completed by other providers:   None reported  1(Analgesic  benefit): Expressed in percentage (%). (Local anesthetic[LA] +/- sedation  L.A.Local Anesthetic  Steroid benefit  Ongoing benefit)   Provider-requested follow-up: No follow-ups on file.  Future Appointments  Date Time Provider Department Center  08/07/2023  1:00 PM Tanya Glisson, MD [facility] None   I discussed the assessment and treatment plan with the patient. The patient was provided an opportunity to ask questions and all were answered. The patient agreed with the plan and demonstrated an understanding of the instructions.  Patient advised to call back or seek an in-person evaluation if the symptoms or condition worsens.  Duration of encounter: *** minutes.  Total time on encounter, as per AMA guidelines included both the face-to-face and non-face-to-face time personally spent by the physician and/or other qualified health care professional(s) on the day of the encounter (includes time in activities that require the physician or other qualified health care professional and does not include time in activities normally performed by clinical staff). Physician's time may include the following activities when performed: Preparing to see the patient (e.g., pre-charting review of records, searching for previously ordered imaging, lab work, and nerve conduction tests) Review of prior analgesic pharmacotherapies. Reviewing PMP Interpreting ordered tests (e.g., lab work, imaging, nerve conduction tests) Performing post-procedure evaluations, including interpretation of diagnostic procedures Obtaining and/or reviewing separately obtained history Performing a medically appropriate examination and/or evaluation Counseling and educating the patient/family/caregiver Ordering medications, tests, or procedures Referring and communicating with other health care professionals (when not separately reported) Documenting clinical information in the electronic or other health record Independently  interpreting results (not separately reported) and communicating results to the patient/ family/caregiver Care coordination (not separately reported)  Note by: Glisson DELENA Tanya, MD (TTS and AI technology used. I apologize for any typographical errors that were not detected and corrected.) Date: 08/07/2023; Time: 7:22 PM

## 2023-08-07 ENCOUNTER — Ambulatory Visit (HOSPITAL_BASED_OUTPATIENT_CLINIC_OR_DEPARTMENT_OTHER): Admitting: Pain Medicine

## 2023-08-07 ENCOUNTER — Other Ambulatory Visit: Payer: Self-pay | Admitting: Family Medicine

## 2023-08-07 DIAGNOSIS — G8929 Other chronic pain: Secondary | ICD-10-CM

## 2023-08-07 DIAGNOSIS — Z91199 Patient's noncompliance with other medical treatment and regimen due to unspecified reason: Secondary | ICD-10-CM

## 2023-08-07 DIAGNOSIS — Z789 Other specified health status: Secondary | ICD-10-CM

## 2023-08-07 DIAGNOSIS — M899 Disorder of bone, unspecified: Secondary | ICD-10-CM

## 2023-08-07 DIAGNOSIS — G894 Chronic pain syndrome: Secondary | ICD-10-CM

## 2023-08-07 DIAGNOSIS — Z79899 Other long term (current) drug therapy: Secondary | ICD-10-CM

## 2023-08-16 ENCOUNTER — Other Ambulatory Visit: Payer: Self-pay | Admitting: Family Medicine

## 2023-08-16 DIAGNOSIS — F3289 Other specified depressive episodes: Secondary | ICD-10-CM

## 2023-08-24 ENCOUNTER — Other Ambulatory Visit: Payer: Self-pay | Admitting: Family Medicine

## 2023-08-24 DIAGNOSIS — F411 Generalized anxiety disorder: Secondary | ICD-10-CM

## 2023-08-28 ENCOUNTER — Other Ambulatory Visit: Payer: Self-pay | Admitting: Family Medicine

## 2023-08-28 DIAGNOSIS — F411 Generalized anxiety disorder: Secondary | ICD-10-CM

## 2023-08-29 ENCOUNTER — Ambulatory Visit

## 2023-09-03 ENCOUNTER — Other Ambulatory Visit: Payer: Self-pay | Admitting: Family Medicine

## 2023-09-03 DIAGNOSIS — G40909 Epilepsy, unspecified, not intractable, without status epilepticus: Secondary | ICD-10-CM

## 2023-09-13 ENCOUNTER — Ambulatory Visit: Admitting: Family Medicine

## 2023-09-13 ENCOUNTER — Other Ambulatory Visit: Payer: Self-pay | Admitting: Family Medicine

## 2023-09-18 ENCOUNTER — Other Ambulatory Visit: Payer: Self-pay | Admitting: Family Medicine

## 2023-10-09 ENCOUNTER — Other Ambulatory Visit: Payer: Self-pay | Admitting: Family Medicine

## 2023-10-09 DIAGNOSIS — J3089 Other allergic rhinitis: Secondary | ICD-10-CM

## 2023-10-11 ENCOUNTER — Encounter: Payer: Self-pay | Admitting: Family Medicine

## 2023-10-12 MED ORDER — WEGOVY 0.25 MG/0.5ML ~~LOC~~ SOAJ: 0.2500 mg | SUBCUTANEOUS | 2 refills | Status: DC

## 2023-10-26 ENCOUNTER — Other Ambulatory Visit: Payer: Self-pay | Admitting: Family Medicine

## 2023-10-26 NOTE — Telephone Encounter (Unsigned)
 Copied from CRM #[id number]. Topic: Clinical - Medication Refill >> Oct 26, 2023 10:26 AM Sharon Bryan wrote: Medication: semaglutide -weight management (WEGOVY ) 0.25 MG/0.5ML SOAJ SQ injection  Has the patient contacted their pharmacy? Yes (Agent: If no, request that the patient contact the pharmacy for the refill. If patient does not wish to contact the pharmacy document the reason why and proceed with request.) (Agent: If yes, when and what did the pharmacy advise?) said need preauth **  This is the patient's preferred pharmacy:  [id number], [place] - [address] AT [address] [place] [address] Phone: [phone] Fax: [phone]  Is this the correct pharmacy for this prescription? Yes If no, delete pharmacy and type the correct one.   Has the prescription been filled recently? No  Is the patient out of the medication? Yes  Has the patient been seen for an appointment in the last year OR does the patient have an upcoming appointment? Yes  Can we respond through MyChart? Yes  Agent: Please be advised that Rx refills may take up to 3 business days. We ask that you follow-up with your pharmacy.

## 2023-10-27 MED ORDER — WEGOVY 0.25 MG/0.5ML ~~LOC~~ SOAJ: 0.2500 mg | SUBCUTANEOUS | 2 refills | Status: DC

## 2023-10-27 NOTE — Telephone Encounter (Signed)
 Requested Prescriptions  Pending Prescriptions Disp Refills   semaglutide -weight management (WEGOVY ) 0.25 MG/0.5ML SOAJ SQ injection 2 mL 2    Sig: Inject 0.25 mg into the skin once a week. For four weeks, then increase to 0.5mg  weekly     Endocrinology:  Diabetes - GLP-1 Receptor Agonists - semaglutide  Failed - 10/27/2023  2:03 PM      Failed - HBA1C in normal range and within 180 days    No results found for: HGBA1C, LABA1C       Failed - Valid encounter within last 6 months    Recent Outpatient Visits   None            Passed - Cr in normal range and within 360 days    Creatinine  Date Value Ref Range Status  09/18/2012 0.87 0.60 - 1.30 mg/dL Final   Creatinine, Ser  Date Value Ref Range Status  05/22/2023 0.72 0.44 - 1.00 mg/dL Final

## 2023-10-30 ENCOUNTER — Other Ambulatory Visit: Payer: Self-pay | Admitting: Family Medicine

## 2023-10-30 DIAGNOSIS — F411 Generalized anxiety disorder: Secondary | ICD-10-CM

## 2023-11-02 ENCOUNTER — Ambulatory Visit: Payer: Self-pay

## 2023-11-02 NOTE — Telephone Encounter (Signed)
 FYI Only or Action Required?: Action required by provider: request for appointment and antibiotic request, pcp work in visit request.  Patient was last seen in primary care on 01/31/2023 by Gasper Nancyann BRAVO, MD.  Called Nurse Triage reporting Sinusitis.  Symptoms began several days ago.  Interventions attempted: Rest, hydration, or home remedies.  Symptoms are: gradually worsening.  Triage Disposition: See Physician Within 24 Hours  Patient/caregiver understands and will follow disposition?: No, refuses disposition   Copied from CRM #[id number]. Topic: Clinical - Red Word Triage >> Nov 02, 2023  4:14 PM Amy B wrote: Red Word that prompted transfer to Nurse Triage: Sinus pain and pressure, congestion Reason for Disposition  Earache  Answer Assessment - Initial Assessment Questions Additional info:  1) Spouse calling to schedule with PCP only, pcp first available appointment is not until 11/10/23. Offered acute visit with alternate provider but refused. Spouse states she gets sinus infection this time each year and treated with antibiotic. Asking for Dr. Gasper to send in antibiotic and if she can be worked into his schedule this week for acute sinus symptoms.     1. LOCATION: Where does it hurt?      Sinus pressure 2. ONSET: When did the sinus pain start?  (e.g., hours, days)      4 days ago 3. SEVERITY: How bad is the pain?   (Scale 0-10; or none, mild, moderate or severe)     Moderate  4. RECURRENT SYMPTOM: Have you ever had sinus problems before? If Yes, ask: When was the last time? and What happened that time?      yes 5. NASAL CONGESTION: Is the nose blocked? If Yes, ask: Can you open it or must you breathe through your mouth?     congested 6. NASAL DISCHARGE: Do you have discharge from your nose? If so ask, What color?     c 7. FEVER: Do you have a fever? If Yes, ask: What is it, how was it measured, and when did it start?      denies 8. OTHER  SYMPTOMS: Do you have any other symptoms? (e.g., sore throat, cough, earache, difficulty breathing)     Ear pain and fatigue 9. PREGNANCY: Is there any chance you are pregnant? When was your last menstrual period?  Protocols used: Sinus Pain or Congestion-A-AH

## 2023-11-03 NOTE — Telephone Encounter (Signed)
 Called patient and offered appointment  with Dr. Donzella. Patient declined. Patient aware Dr.Fisher not in the office today. Patient stated that's ok that she will wait for him because he know her history and that he will know what to do.

## 2023-11-06 ENCOUNTER — Other Ambulatory Visit: Payer: Self-pay | Admitting: Family Medicine

## 2023-11-06 MED ORDER — AMOXICILLIN-POT CLAVULANATE 875-125 MG PO TABS: 1.0000 | ORAL_TABLET | Freq: Two times a day (BID) | ORAL | 0 refills | Status: DC

## 2023-11-14 ENCOUNTER — Ambulatory Visit: Payer: Self-pay

## 2023-11-14 NOTE — Telephone Encounter (Signed)
 FYI Only or Action Required?: FYI only for provider.  Patient was last seen in primary care on 01/31/2023 by Gasper Nancyann BRAVO, MD.  Called Nurse Triage reporting Fatigue.  Symptoms began about a month ago.  Interventions attempted: Nothing.  Symptoms are: gradually worsening.  Triage Disposition: See PCP When Office is Open (Within 3 Days)  Patient/caregiver understands and will follow disposition?: Yes                            Copied from CRM #[id number]. Topic: Clinical - Red Word Triage >> Nov 14, 2023 10:31 AM Ivette P wrote: [facility] that prompted transfer to Nurse Triage: not feeling like herself    not wanting to pass always tired, not feeling like herself.  sleeping at least 14 hours. And taking naps.    extreme fatigue Reason for Disposition  [1] Fatigue (i.e., tires easily, decreased energy) AND [2] persists > 1 week  Answer Assessment - Initial Assessment Questions 1. DESCRIPTION: Describe how you are feeling.     Extremely exhausted, sleeping in later than usual and going to bed earlier than usual  2. SEVERITY: How bad is it?  Can you stand and walk?     Denies weakness, denies symptoms interfering with ability to walk 3. ONSET: When did these symptoms begin? (e.g., hours, days, weeks, months)     About a month ago  4. CAUSE: What do you think is causing the weakness or fatigue? (e.g., not drinking enough fluids, medical problem, trouble sleeping)     Unsure, states fatigue started after a fibromyalgia attack, states she was diagnosed with chronic fatigue roughly 25 years ago 5. NEW MEDICINES:  Have you started on any new medicines recently? (e.g., opioid pain medicines, benzodiazepines, muscle relaxants, antidepressants, antihistamines, neuroleptics, beta blockers)     Denies  6. OTHER SYMPTOMS: Do you have any other symptoms? (e.g., chest pain, fever, cough, SOB, vomiting, diarrhea, bleeding, other areas of pain)     Sinus  infection that is clearing up, taking roughly a 3 hour nap everyday, states she is sleeping normally 7. PREGNANCY: Is there any chance you are pregnant? When was your last menstrual period?     N/A  Protocols used: Weakness (Generalized) and Fatigue-A-AH

## 2023-11-15 ENCOUNTER — Ambulatory Visit (INDEPENDENT_AMBULATORY_CARE_PROVIDER_SITE_OTHER): Admitting: Family Medicine

## 2023-11-15 ENCOUNTER — Encounter: Payer: Self-pay | Admitting: Family Medicine

## 2023-11-15 VITALS — BP 124/67 | HR 81 | Resp 16 | Ht 63.0 in | Wt 248.1 lb

## 2023-11-15 DIAGNOSIS — E041 Nontoxic single thyroid nodule: Secondary | ICD-10-CM | POA: Diagnosis not present

## 2023-11-15 DIAGNOSIS — F321 Major depressive disorder, single episode, moderate: Secondary | ICD-10-CM

## 2023-11-15 DIAGNOSIS — D539 Nutritional anemia, unspecified: Secondary | ICD-10-CM

## 2023-11-15 DIAGNOSIS — E559 Vitamin D deficiency, unspecified: Secondary | ICD-10-CM

## 2023-11-15 DIAGNOSIS — E781 Pure hyperglyceridemia: Secondary | ICD-10-CM

## 2023-11-15 DIAGNOSIS — N951 Menopausal and female climacteric states: Secondary | ICD-10-CM

## 2023-11-15 DIAGNOSIS — R5381 Other malaise: Secondary | ICD-10-CM | POA: Diagnosis not present

## 2023-11-15 DIAGNOSIS — M797 Fibromyalgia: Secondary | ICD-10-CM | POA: Diagnosis not present

## 2023-11-15 DIAGNOSIS — R1013 Epigastric pain: Secondary | ICD-10-CM

## 2023-11-15 DIAGNOSIS — H6502 Acute serous otitis media, left ear: Secondary | ICD-10-CM

## 2023-11-15 DIAGNOSIS — D509 Iron deficiency anemia, unspecified: Secondary | ICD-10-CM

## 2023-11-15 DIAGNOSIS — Z23 Encounter for immunization: Secondary | ICD-10-CM | POA: Diagnosis not present

## 2023-11-15 DIAGNOSIS — M791 Myalgia, unspecified site: Secondary | ICD-10-CM

## 2023-11-15 DIAGNOSIS — G40909 Epilepsy, unspecified, not intractable, without status epilepticus: Secondary | ICD-10-CM

## 2023-11-15 DIAGNOSIS — G4733 Obstructive sleep apnea (adult) (pediatric): Secondary | ICD-10-CM

## 2023-11-15 DIAGNOSIS — Z6841 Body Mass Index (BMI) 40.0 and over, adult: Secondary | ICD-10-CM

## 2023-11-15 MED ORDER — MODAFINIL 100 MG PO TABS: 100.0000 mg | ORAL_TABLET | Freq: Every day | ORAL | 0 refills | Status: DC

## 2023-11-15 NOTE — Addendum Note (Signed)
 Addended by: GASPER NANCYANN BRAVO on: 11/15/2023 05:51 PM   Modules accepted: Orders

## 2023-11-15 NOTE — Patient Instructions (Signed)
 Sharon Bryan  Please review the attached list of medications and notify my office if there are any errors.   . Please bring all of your medications to every appointment so we can make sure that our medication list is the same as yours.

## 2023-11-15 NOTE — Progress Notes (Signed)
 Established patient visit   Patient: Sharon Bryan   DOB: 02/16/1965   58 y.o. Female  MRN: [id number] Visit Date: 11/15/2023  Today's healthcare provider: Nancyann Perry, MD   Chief Complaint  Patient presents with   Fatigue   Ear Fullness   Hot Flashes   Subjective      History of Present Illness   Sharon Bryan is a 58 year old female with fibromyalgia who presents with worsening fatigue and exhaustion.  She has been experiencing severe fatigue and exhaustion, which has worsened over the past month. The fatigue began after returning from a visit to her grandchildren, where she experienced a fibromyalgia flare-up. She wakes up between 5 and 7 AM but feels unwell and returns to bed by 10 AM, often sleeping until the afternoon. Her partner reports that she has been experiencing more fatigue and body aches. She has a history of fibromyalgia and has been taking duloxetine  and generic Lyrica  for management. She previously took Provigil  for energy but stopped about six months ago.  She has experienced swelling in her hands and feet, noticeable when trying to wear shoes that previously fit. Her partner mentions that her feet are currently swollen. She has had episodes of heart fluttering, which are managed with magnesium citrate 500 mg daily. No shortness of breath or significant changes in heart fluttering.  She reports a burning sensation in her stomach, present upon waking and sometimes easing after eating. Her bowel movements have improved since a course of Xifaxan  in July, but her diet has been poor due to fatigue, relying on fast food and eating out. She has a history of stomach ulcer as a child.  She experiences occasional hot flashes, primarily affecting her face, which feels hot but does not appear red. She has a history of sinus issues but reports that her nose is currently clear. She has not been using nasal sprays due to discomfort and side effects. Her partner  reports that she has been less active, spending most of her time sitting or sleeping, and has not been able to perform usual activities like grocery shopping.       Medications: Outpatient Medications Prior to Visit  Medication Sig Note   amoxicillin -clavulanate (AUGMENTIN ) 875-125 MG tablet Take 1 tablet by mouth 2 (two) times daily.    Aspirin-Acetaminophen -Caffeine (EXCEDRIN PO) Take 1 tablet daily as needed by mouth.    busPIRone (BUSPAR) 30 MG tablet TAKE 1 TABLET BY MOUTH TWICE DAILY    Cholecalciferol (VITAMIN D3) 125 MCG (5000 UT) CAPS Take 1 capsule (5,000 Units total) by mouth daily.    diazepam  (VALIUM ) 5 MG tablet TAKE 1 TABLET BY MOUTH EVERY 8 HOURS AS NEEDED FOR ANXIETY    DULoxetine  (CYMBALTA ) 60 MG capsule TAKE 1 CAPSULE(60 MG) BY MOUTH DAILY    estradiol  (ESTRACE ) 2 MG tablet TAKE 1 TABLET(2 MG) BY MOUTH DAILY    fluticasone  (FLONASE ) 50 MCG/ACT nasal spray SHAKE LIQUID AND USE 2 SPRAYS IN EACH NOSTRIL DAILY    lamoTRIgine  (LAMICTAL ) 100 MG tablet Take 150 mg by mouth.    levothyroxine  (SYNTHROID ) 75 MCG tablet TAKE 1 TABLET(75 MCG) BY MOUTH DAILY    Magnesium Oxide 420 MG TABS Take 400 mg by mouth daily.    Multiple Vitamins-Minerals (CENTRUM SILVER PO) Take by mouth.    nabumetone  (RELAFEN ) 750 MG tablet TAKE 1 TO 2 TABLETS(750 TO 1500 MG) BY MOUTH DAILY AS NEEDED FOR LEG AND KNEE PAIN  neomycin -polymyxin-hydrocortisone (CORTISPORIN) 3.5-10000-1 OTIC suspension SHAKE LIQUID AND INSTILL 3 TO 4 DROPS TO AFFECTED EAR FOUR TIMES DAILY    nortriptyline  (PAMELOR ) 50 MG capsule Take 50 mg by mouth at bedtime.    omeprazole (PRILOSEC) 20 MG capsule TAKE 1 CAPSULE BY MOUTH EVERY DAY    PARoxetine  (PAXIL ) 30 MG tablet TAKE 2 TABLETS(60 MG) BY MOUTH DAILY    phenytoin  (DILANTIN ) 100 MG ER capsule TAKE 2 CAPSULES BY MOUTH EVERY MORNING THEN 3 CAPSULES BY MOUTH EVERY EVENING    pregabalin  (LYRICA ) 225 MG capsule TAKE 1 CAPSULE BY MOUTH TWICE DAILY    rifaximin  (XIFAXAN ) 550 MG TABS  tablet Take 1 tablet (550 mg total) by mouth 3 (three) times daily.    lamoTRIgine  (LAMICTAL ) 100 MG tablet TAKE 1 TABLET BY MOUTH TWICE DAILY    nortriptyline  (PAMELOR ) 10 MG capsule TAKE 1 TO 4 CAPSULES(10 TO 40 MG) BY MOUTH AT BEDTIME    semaglutide -weight management (WEGOVY ) 0.25 MG/0.5ML SOAJ SQ injection Inject 0.25 mg into the skin once a week. For four weeks, then increase to 0.5mg  weekly (Patient not taking: Reported on 11/15/2023)    [DISCONTINUED] modafinil  (PROVIGIL ) 200 MG tablet Take 2 tablets (400 mg total) by mouth every morning. 11/15/2023: has been off off for several months   [DISCONTINUED] promethazine -dextromethorphan (PROMETHAZINE -DM) 6.25-15 MG/5ML syrup Take 5 mLs by mouth 4 (four) times daily as needed for cough.    [DISCONTINUED] traMADol  (ULTRAM ) 50 MG tablet Take 1 tablet (50 mg total) by mouth every 8 (eight) hours as needed. (Patient not taking: Reported on 05/22/2023)    No facility-administered medications prior to visit.      Objective    BP 124/67 (BP Location: Left Arm, Patient Position: Sitting, Cuff Size: Large)   Pulse 81   Resp 16   Ht 5' 3 (1.6 m)   Wt 248 lb 1.6 oz (112.5 kg)   LMP 04/26/2002 (LMP Unknown)   SpO2 94%   BMI 43.95 kg/m   Physical Exam   General: Appearance:    Severely obese female in no acute distress  Eyes:    PERRL, conjunctiva/corneas clear, EOM's intact       Lungs:     Clear to auscultation bilaterally, respirations unlabored  Heart:    Normal heart rate. Normal rhythm. No murmurs, rubs, or gallops.    MS:   All extremities are intact.    Neurologic:   Awake, alert, oriented x 3. No apparent focal neurological defect.          Assessment & Plan    1. Thyroid  cyst (Primary)  - TSH + free T4  2. Seizure disorder (HCC) Well controlled on current anti-epileptic medications. Continue regular follow up neurology.   3. Iron deficiency anemia, unspecified iron deficiency anemia type  - Iron, TIBC and Ferritin  Panel  4. Epigastric pain She reports history of ulcers as adolescent.  - Lipase - H. pylori breath test  5. Vitamin D  deficiency Taking D3 2000 units consistently every day.  - Vitamin D  (25 hydroxy)  6. Malaise and fatigue  - CBC - Comprehensive metabolic panel with GFR  7. Menopausal symptoms   8. Adult BMI 40.0-44.9 kg/sq m (HCC) Weigh control difficult due to poor energy and excessive daytime sleepiness.  9. Myalgia Taking pregabalin  for fibromyalgia.  - Sed Rate (ESR) - C-reactive protein  10. Current moderate episode of major depressive disorder without prior episode (HCC) Continues on duloxetine  and paroxetine .   11. Hypertriglyceridemia Due to check  Lipid panel  12. Nutritional anemia  - B12 and Folate Panel  13. Immunization due  - Flu vaccine trivalent PF, 6mos and older(Flulaval,Afluria,Fluarix,Fluzone)  14. Non-recurrent acute serous otitis media of left ear Recommend OTC nasal steroid since as Nasocart AQ.   15. OSA (obstructive sleep apnea) Excessive daytime sleepiness, had done well on 200mg  modafanil in the past, restart at starting dose of  modafinil  (PROVIGIL ) 100 MG tablet; Take 1 tablet (100 mg total) by mouth daily.    Addressed extensive list of chronic and acute medical problems today requiring 45 minutes reviewing her medical record, counseling patient regarding her conditions and coordination of care.        Sharon Perry, MD  [facility] [phone] (phone) [phone] (fax)  [facility]

## 2023-11-25 ENCOUNTER — Other Ambulatory Visit: Payer: Self-pay | Admitting: Family Medicine

## 2023-11-25 DIAGNOSIS — F411 Generalized anxiety disorder: Secondary | ICD-10-CM

## 2023-12-07 ENCOUNTER — Ambulatory Visit: Payer: Self-pay

## 2023-12-07 NOTE — Telephone Encounter (Signed)
 FYI Only or Action Required?: FYI only for provider: appointment scheduled on 12/12/23.  Patient was last seen in primary care on 11/15/2023 by Gasper Nancyann BRAVO, MD.  Called Nurse Triage reporting Fatigue and Pain.  Symptoms began several months ago.  Interventions attempted: Rest, hydration, or home remedies.  Symptoms are: stable.  Triage Disposition: See PCP Within 2 Weeks  Patient/caregiver understands and will follow disposition?: Yes Reason for Disposition  Nursing judgment or information in reference  Answer Assessment - Initial Assessment Questions Patient states everything hurts and generally feels like she has he flu all the time. Takes a dozen medications, sometimes takes Tylenol  for headaches. Treated for sinus infection about a month ago, does not believe it went away.   1. REASON FOR CALL: What is your main concern right now?     Fatigue, fibromyalgia pain all over, headaches  2. ONSET: When did the symptoms start?     2 months ago  3. SEVERITY: How bad is the symptoms?     10/10  4. FUNCTIONAL IMPAIRMENT: How have things been going for you overall? Have you had more difficulty than usual doing your normal daily activities? (e.g., self-care, school, work, interactions)     Affects daily activities  5. RELIEVING AND AGGRAVATING FACTORS: What makes it better or worse? (e.g., certain activities, rest)     Denies  6. FEVER: Do you have a fever?     Denies  7. OTHER SYMPTOMS: Do you have any other new symptoms?     Denies  Protocols used: No Guideline Available-A-AH  Copied from CRM #[id number]. Topic: Clinical - Red Word Triage >> Dec 07, 2023  4:26 PM Wess RAMAN wrote: Red Word that prompted transfer to Nurse Triage: Patient's husband, Maude, stated patient is still not feeling well. Pain all over, Lethargy,

## 2023-12-08 LAB — COMPREHENSIVE METABOLIC PANEL WITH GFR
ALT: 13 IU/L (ref 0–32)
AST: 14 IU/L (ref 0–40)
Albumin: 4 g/dL (ref 3.8–4.9)
Alkaline Phosphatase: 87 IU/L (ref 49–135)
BUN/Creatinine Ratio: 17 (ref 9–23)
BUN: 11 mg/dL (ref 6–24)
Bilirubin Total: <0.2 mg/dL (ref 0.0–1.2)
CO2: 24 mmol/L (ref 20–29)
Calcium: 9.6 mg/dL (ref 8.7–10.2)
Chloride: 104 mmol/L (ref 96–106)
Creatinine, Ser: 0.63 mg/dL (ref 0.57–1.00)
Globulin, Total: 2.4 g/dL (ref 1.5–4.5)
Glucose: 80 mg/dL (ref 70–99)
Potassium: 4.2 mmol/L (ref 3.5–5.2)
Sodium: 139 mmol/L (ref 134–144)
Total Protein: 6.4 g/dL (ref 6.0–8.5)
eGFR: 104 mL/min/1.73 (ref >59)

## 2023-12-08 LAB — LIPID PANEL
Chol/HDL Ratio: 2.3 ratio (ref 0.0–4.4)
Cholesterol, Total: 223 mg/dL — ABNORMAL HIGH (ref 100–199)
HDL: 96 mg/dL (ref >39)
LDL Chol Calc (NIH): 114 mg/dL — ABNORMAL HIGH (ref 0–99)
Triglycerides: 78 mg/dL (ref 0–149)
VLDL Cholesterol Cal: 13 mg/dL (ref 5–40)

## 2023-12-08 LAB — CBC
Hematocrit: 37.1 % (ref 34.0–46.6)
Hemoglobin: 13 g/dL (ref 11.1–15.9)
MCH: 33.6 pg — ABNORMAL HIGH (ref 26.6–33.0)
MCHC: 35 g/dL (ref 31.5–35.7)
MCV: 96 fL (ref 79–97)
Platelets: 292 x10E3/uL (ref 150–450)
RBC: 3.87 x10E6/uL (ref 3.77–5.28)
RDW: 12.5 % (ref 11.7–15.4)
WBC: 8.3 x10E3/uL (ref 3.4–10.8)

## 2023-12-08 LAB — IRON,TIBC AND FERRITIN PANEL
Ferritin: 68 ng/mL (ref 15–150)
Iron Saturation: 39 % (ref 15–55)
Iron: 121 ug/dL (ref 27–159)
Total Iron Binding Capacity: 307 ug/dL (ref 250–450)
UIBC: 186 ug/dL (ref 131–425)

## 2023-12-08 LAB — B12 AND FOLATE PANEL
Folate: 12.5 ng/mL (ref >3.0)
Vitamin B-12: >2000 pg/mL — ABNORMAL HIGH (ref 232–1245)

## 2023-12-08 LAB — TSH+FREE T4
Free T4: 1.22 ng/dL (ref 0.82–1.77)
TSH: 3.08 u[IU]/mL (ref 0.450–4.500)

## 2023-12-08 LAB — C-REACTIVE PROTEIN: CRP: 9 mg/L (ref 0–10)

## 2023-12-08 LAB — VITAMIN D 25 HYDROXY (VIT D DEFICIENCY, FRACTURES): Vit D, 25-Hydroxy: 46 ng/mL (ref 30.0–100.0)

## 2023-12-08 LAB — SEDIMENTATION RATE: Sed Rate: 22 mm/h (ref 0–40)

## 2023-12-08 LAB — LIPASE: Lipase: 28 U/L (ref 14–72)

## 2023-12-08 LAB — H. PYLORI BREATH TEST

## 2023-12-12 ENCOUNTER — Ambulatory Visit: Payer: Self-pay | Admitting: Family Medicine

## 2023-12-12 ENCOUNTER — Encounter: Payer: Self-pay | Admitting: Family Medicine

## 2023-12-12 DIAGNOSIS — G4719 Other hypersomnia: Secondary | ICD-10-CM

## 2023-12-13 ENCOUNTER — Telehealth (INDEPENDENT_AMBULATORY_CARE_PROVIDER_SITE_OTHER): Admitting: Family Medicine

## 2023-12-13 DIAGNOSIS — M797 Fibromyalgia: Secondary | ICD-10-CM

## 2023-12-13 DIAGNOSIS — R7989 Other specified abnormal findings of blood chemistry: Secondary | ICD-10-CM

## 2023-12-13 DIAGNOSIS — G4733 Obstructive sleep apnea (adult) (pediatric): Secondary | ICD-10-CM

## 2023-12-13 DIAGNOSIS — G4719 Other hypersomnia: Secondary | ICD-10-CM | POA: Diagnosis not present

## 2023-12-13 DIAGNOSIS — J300 Vasomotor rhinitis: Secondary | ICD-10-CM

## 2023-12-13 MED ORDER — LEVOCETIRIZINE DIHYDROCHLORIDE 5 MG PO TABS: 5.0000 mg | ORAL_TABLET | Freq: Every evening | ORAL | 1 refills | Status: DC

## 2023-12-13 MED ORDER — MODAFINIL 200 MG PO TABS: 200.0000 mg | ORAL_TABLET | Freq: Every day | ORAL | 2 refills | Status: AC

## 2023-12-13 MED ORDER — ZEPBOUND 2.5 MG/0.5ML ~~LOC~~ SOAJ: 2.5000 mg | SUBCUTANEOUS | 0 refills | Status: DC

## 2023-12-13 MED ORDER — CYANOCOBALAMIN 250 MCG PO TABS: 250.0000 ug | ORAL_TABLET | Freq: Every day | ORAL | Status: AC

## 2023-12-13 NOTE — Progress Notes (Signed)
 MyChart Video Visit    Virtual Visit via Video Note   This format is felt to be most appropriate for this patient at this time. Physical exam was limited by quality of the video and audio technology used for the visit.   Patient location: home Provider location: [facility]  I discussed the limitations of evaluation and management by telemedicine and the availability of in person appointments. The patient expressed understanding and agreed to proceed.  Patient: Sharon Bryan   DOB: Mar 27, 1965   58 y.o. Female  MRN: [id number] Visit Date: 12/13/2023  Today's healthcare provider: Nancyann Perry, MD   No chief complaint on file.  Subjective    HPI  Discussed the use of AI scribe software for clinical note transcription with the patient, who gave verbal consent to proceed.  History of Present Illness   Sharon Bryan is a 58 year old female with fibromyalgia who presents for a follow-up on medications and lab tests.  She experiences ongoing issues with fibromyalgia and reports joint aches and pains. She has been taking generic Lyrica  (pregabalin ) for about eight months, but it has become difficult to obtain. She describes constant pain, including jaw pain that feels like a toothache but is not related to her teeth.  She has known moderate to severe sleep apnea last MSPG done earlier this year. Has not yet received a CPAP. She has started taking Nuvigil, with a dose of 200 mg taken this morning, but is running low on her current supply of 100 mg tablets.  She inquires about her recent lab tests, specifically the H. pylori test, which was negative. Her vitamin B12 levels were high, likely due to a previous intake of 2500 mcg supplements, which she has since stopped.  She experiences sinus congestion and pressure, particularly in her left ear, which gurgles and pops. She does not use Flonase  due to its smell causing headaches and has not tried saline spray. She occasionally takes  Benadryl at night for relief, which helps but causes drowsiness.  She mentions a family history of heart disease, with her father and uncles having died from heart attacks. She is currently on Medicare and Medicaid.     Office Visit on 11/15/2023  Component Date Value Ref Range Status   Vit D, 25-Hydroxy 12/06/2023 46.0  30.0 - 100.0 ng/mL Final   WBC 12/06/2023 8.3  3.4 - 10.8 x10E3/uL Final   RBC 12/06/2023 3.87  3.77 - 5.28 x10E6/uL Final   Hemoglobin 12/06/2023 13.0  11.1 - 15.9 g/dL Final   Hematocrit 88/87/7974 37.1  34.0 - 46.6 % Final   MCV 12/06/2023 96  79 - 97 fL Final   MCH 12/06/2023 33.6 (H)  26.6 - 33.0 pg Final   MCHC 12/06/2023 35.0  31.5 - 35.7 g/dL Final   RDW 88/87/7974 12.5  11.7 - 15.4 % Final   Platelets 12/06/2023 292  150 - 450 x10E3/uL Final   Glucose 12/06/2023 80  70 - 99 mg/dL Final   BUN 88/87/7974 11  6 - 24 mg/dL Final   Creatinine, Ser 12/06/2023 0.63  0.57 - 1.00 mg/dL Final   eGFR 88/87/7974 104  >59 mL/min/1.73 Final   BUN/Creatinine Ratio 12/06/2023 17  9 - 23 Final   Sodium 12/06/2023 139  134 - 144 mmol/L Final   Potassium 12/06/2023 4.2  3.5 - 5.2 mmol/L Final   Chloride 12/06/2023 104  96 - 106 mmol/L Final   CO2 12/06/2023 24  20 - 29 mmol/L  Final   Calcium 12/06/2023 9.6  8.7 - 10.2 mg/dL Final   Total Protein 88/87/7974 6.4  6.0 - 8.5 g/dL Final   Albumin 88/87/7974 4.0  3.8 - 4.9 g/dL Final   Globulin, Total 12/06/2023 2.4  1.5 - 4.5 g/dL Final   Bilirubin Total 12/06/2023 <0.2  0.0 - 1.2 mg/dL Final   Alkaline Phosphatase 12/06/2023 87  49 - 135 IU/L Final   AST 12/06/2023 14  0 - 40 IU/L Final   ALT 12/06/2023 13  0 - 32 IU/L Final   Cholesterol, Total 12/06/2023 223 (H)  100 - 199 mg/dL Final   Triglycerides 88/87/7974 78  0 - 149 mg/dL Final   HDL 88/87/7974 96  >39 mg/dL Final   VLDL Cholesterol Cal 12/06/2023 13  5 - 40 mg/dL Final   LDL Chol Calc (NIH) 12/06/2023 114 (H)  0 - 99 mg/dL Final   Chol/HDL Ratio 12/06/2023 2.3  0.0  - 4.4 ratio Final   TSH 12/06/2023 3.080  0.450 - 4.500 uIU/mL Final   Free T4 12/06/2023 1.22  0.82 - 1.77 ng/dL Final   Total Iron Binding Capacity 12/06/2023 307  250 - 450 ug/dL Final   UIBC 88/87/7974 186  131 - 425 ug/dL Final   Iron 88/87/7974 121  27 - 159 ug/dL Final   Iron Saturation 12/06/2023 39  15 - 55 % Final   Ferritin 12/06/2023 68  15 - 150 ng/mL Final   Sed Rate 12/06/2023 22  0 - 40 mm/hr Final   CRP 12/06/2023 9  0 - 10 mg/L Final   Lipase 12/06/2023 28  14 - 72 U/L Final   H pylori Breath Test 12/06/2023 Negative  Negative Final   Vitamin B-12 12/06/2023 >2000 (H)  232 - 1245 pg/mL Final   Folate 12/06/2023 12.5  >3.0 ng/mL Final   BMI Readings from Last 3 Encounters:  11/15/23 43.95 kg/m  05/22/23 46.09 kg/m  12/05/22 46.27 kg/m   Wt Readings from Last 3 Encounters:  11/15/23 248 lb 1.6 oz (112.5 kg)  05/22/23 252 lb (114.3 kg)  12/05/22 253 lb (114.8 kg)     Medications: Outpatient Medications Prior to Visit  Medication Sig   Aspirin-Acetaminophen -Caffeine (EXCEDRIN PO) Take 1 tablet daily as needed by mouth.   busPIRone (BUSPAR) 30 MG tablet TAKE 1 TABLET BY MOUTH TWICE DAILY   Cholecalciferol (VITAMIN D3) 125 MCG (5000 UT) CAPS Take 1 capsule (5,000 Units total) by mouth daily.   diazepam  (VALIUM ) 5 MG tablet TAKE 1 TABLET BY MOUTH EVERY 8 HOURS AS NEEDED FOR ANXIETY   DULoxetine  (CYMBALTA ) 60 MG capsule TAKE 1 CAPSULE(60 MG) BY MOUTH DAILY   estradiol  (ESTRACE ) 2 MG tablet TAKE 1 TABLET(2 MG) BY MOUTH DAILY   fluticasone  (FLONASE ) 50 MCG/ACT nasal spray SHAKE LIQUID AND USE 2 SPRAYS IN EACH NOSTRIL DAILY (NOT TAKING)   lamoTRIgine  (LAMICTAL ) 100 MG tablet Take 150 mg by mouth.   levothyroxine  (SYNTHROID ) 75 MCG tablet TAKE 1 TABLET(75 MCG) BY MOUTH DAILY   Magnesium Oxide 420 MG TABS Take 400 mg by mouth daily.   Multiple Vitamins-Minerals (CENTRUM SILVER PO) Take by mouth.   nabumetone  (RELAFEN ) 750 MG tablet TAKE 1 TO 2 TABLETS(750 TO 1500 MG)  BY MOUTH DAILY AS NEEDED FOR LEG AND KNEE PAIN   neomycin -polymyxin-hydrocortisone (CORTISPORIN) 3.5-10000-1 OTIC suspension SHAKE LIQUID AND INSTILL 3 TO 4 DROPS TO AFFECTED EAR FOUR TIMES DAILY   nortriptyline  (PAMELOR ) 50 MG capsule Take 50 mg by mouth at bedtime.  omeprazole (PRILOSEC) 20 MG capsule TAKE 1 CAPSULE BY MOUTH EVERY DAY   PARoxetine  (PAXIL ) 30 MG tablet TAKE 2 TABLETS(60 MG) BY MOUTH DAILY   phenytoin  (DILANTIN ) 100 MG ER capsule TAKE 2 CAPSULES BY MOUTH EVERY MORNING THEN 3 CAPSULES BY MOUTH EVERY EVENING   pregabalin  (LYRICA ) 225 MG capsule TAKE 1 CAPSULE BY MOUTH TWICE DAILY   rifaximin  (XIFAXAN ) 550 MG TABS tablet Take 1 tablet (550 mg total) by mouth 3 (three) times daily.   modafinil  (PROVIGIL ) 100 MG tablet Take 1 tablet (100 mg total) by mouth daily.   No facility-administered medications prior to visit.        Objective    LMP 04/26/2002 (LMP Unknown)    Physical Exam  Awake, alert, oriented x 3. In no apparent distress   Assessment & Plan     1. Fibromyalgia (Primary) Symptoms have been much worse the last several weeks. Unremarkable labs as per HPI. Suspect this is related to sleep disorder and are working on starting treatment as below. Is already on high dose of pregablin and SNRIs. She would like referral to rheumatologist to discuss other treatment options.   2. Morbid obesity (HCC) She has unsuccessfully tried multiple reduced calorie and low glycemic diets to lose weight, and not able to get near a healthy weight. This is contributing to several comorbid conditions including OSA as below.   3. OSA (obstructive sleep apnea) Moderate to Severe Per [facility] July 09, 2023. CPAP order to [facility].  Will try Zepbound 2.5mg  a day for 4 weaks and titrate as tolerated.   4. Excessive daytime sleepiness Increase from 100mg  to modafinil  (PROVIGIL ) 200 MG tablet; Take 1 tablet (200 mg total) by mouth daily.  Dispense: 30 tablet; Refill: 2   5. Vasomotor  rhinitis Intolerant to medicated nasal sprays. Encourage saline sprays.   - levocetirizine (XYZAL) 5 MG tablet; Take 1 tablet (5 mg total) by mouth every evening.  Dispense: 30 tablet; Refill: 1 - levocetirizine (XYZAL) 5 MG tablet; Take 1 tablet (5 mg total) by mouth every evening.  Dispense: 30 tablet; Refill: 1    6. High B12 levels She states she is taking 2500mg  every day. Advised to reduce to 250.    I discussed the assessment and treatment plan with the patient. The patient was provided an opportunity to ask questions and all were answered. The patient agreed with the plan and demonstrated an understanding of the instructions.   The patient was advised to call back or seek an in-person evaluation if the symptoms worsen or if the condition fails to improve as anticipated.  I provided 18 minutes of non-face-to-face time during this encounter.   Nancyann Perry, MD [facility] [phone] (phone) [phone] (fax)  [facility]

## 2023-12-13 NOTE — Patient Instructions (Signed)
 Sharon Bryan  Please review the attached list of medications and notify my office if there are any errors.   . Please bring all of your medications to every appointment so we can make sure that our medication list is the same as yours.

## 2023-12-15 ENCOUNTER — Telehealth: Payer: Self-pay

## 2023-12-15 NOTE — Telephone Encounter (Signed)
 Pharmacy Patient Advocate Encounter  Received notification from [employer] that Prior Authorization for Zepbound  2.5mg /0.44ml Pen has been APPROVED from 12/14/2023 to 01/23/2025

## 2023-12-15 NOTE — Telephone Encounter (Signed)
 LVM for patient requesting a call back.  Okay for E2C2 to advise patient that PA was approved for zepbound  , she can contact her pharmacy to fill this.

## 2023-12-28 ENCOUNTER — Other Ambulatory Visit: Payer: Self-pay | Admitting: Family Medicine

## 2023-12-28 DIAGNOSIS — F411 Generalized anxiety disorder: Secondary | ICD-10-CM

## 2024-01-03 ENCOUNTER — Other Ambulatory Visit: Payer: Self-pay | Admitting: Family Medicine

## 2024-01-03 DIAGNOSIS — G4733 Obstructive sleep apnea (adult) (pediatric): Secondary | ICD-10-CM

## 2024-01-03 MED ORDER — ZEPBOUND 5 MG/0.5ML ~~LOC~~ SOAJ: 5.0000 mg | SUBCUTANEOUS | 2 refills | Status: AC

## 2024-01-15 ENCOUNTER — Encounter: Payer: Self-pay | Admitting: Family Medicine

## 2024-01-16 ENCOUNTER — Other Ambulatory Visit: Payer: Self-pay

## 2024-01-16 MED ORDER — LEVOCETIRIZINE DIHYDROCHLORIDE 5 MG PO TABS: 5.0000 mg | ORAL_TABLET | Freq: Every evening | ORAL | 0 refills | Status: AC

## 2024-02-01 ENCOUNTER — Encounter: Payer: Self-pay | Admitting: Family Medicine

## 2024-02-02 ENCOUNTER — Other Ambulatory Visit: Payer: Self-pay

## 2024-02-02 NOTE — Telephone Encounter (Signed)
 Please review. LOV 12/13/23 NOV no new OV scheduled LRF 11/06/23 qty:90 r:0 (Dr. Lane - Neurology)

## 2024-02-05 ENCOUNTER — Other Ambulatory Visit: Payer: Self-pay

## 2024-02-05 MED ORDER — NORTRIPTYLINE HCL 50 MG PO CAPS: 50.0000 mg | ORAL_CAPSULE | Freq: Every day | ORAL | 1 refills | Status: AC

## 2024-02-22 ENCOUNTER — Other Ambulatory Visit: Payer: Self-pay | Admitting: Family Medicine

## 2024-02-22 DIAGNOSIS — F411 Generalized anxiety disorder: Secondary | ICD-10-CM

## 2024-04-01 ENCOUNTER — Ambulatory Visit
